# Patient Record
Sex: Male | Born: 1940 | Race: Asian | Hispanic: No | Marital: Married | Smoking: Former smoker
Health system: Southern US, Community
[De-identification: ages and names within clinical notes are randomized; demographics above are authoritative.]

## PROBLEM LIST (undated history)

## (undated) DIAGNOSIS — I219 Acute myocardial infarction, unspecified: Secondary | ICD-10-CM

## (undated) DIAGNOSIS — M1990 Unspecified osteoarthritis, unspecified site: Secondary | ICD-10-CM

## (undated) DIAGNOSIS — I255 Ischemic cardiomyopathy: Secondary | ICD-10-CM

## (undated) DIAGNOSIS — K219 Gastro-esophageal reflux disease without esophagitis: Secondary | ICD-10-CM

## (undated) DIAGNOSIS — I2510 Atherosclerotic heart disease of native coronary artery without angina pectoris: Secondary | ICD-10-CM

## (undated) DIAGNOSIS — E119 Type 2 diabetes mellitus without complications: Secondary | ICD-10-CM

## (undated) DIAGNOSIS — Z9289 Personal history of other medical treatment: Secondary | ICD-10-CM

## (undated) DIAGNOSIS — Z86711 Personal history of pulmonary embolism: Secondary | ICD-10-CM

## (undated) DIAGNOSIS — I10 Essential (primary) hypertension: Secondary | ICD-10-CM

## (undated) DIAGNOSIS — R739 Hyperglycemia, unspecified: Secondary | ICD-10-CM

## (undated) DIAGNOSIS — R7611 Nonspecific reaction to tuberculin skin test without active tuberculosis: Secondary | ICD-10-CM

## (undated) DIAGNOSIS — E785 Hyperlipidemia, unspecified: Secondary | ICD-10-CM

## (undated) DIAGNOSIS — I200 Unstable angina: Secondary | ICD-10-CM

## (undated) DIAGNOSIS — E782 Mixed hyperlipidemia: Secondary | ICD-10-CM

## (undated) HISTORY — DX: Nonspecific reaction to tuberculin skin test without active tuberculosis: R76.11

## (undated) HISTORY — DX: Gastro-esophageal reflux disease without esophagitis: K21.9

## (undated) HISTORY — DX: Unspecified osteoarthritis, unspecified site: M19.90

## (undated) HISTORY — DX: Personal history of other medical treatment: Z92.89

## (undated) HISTORY — DX: Ischemic cardiomyopathy: I25.5

## (undated) HISTORY — DX: Mixed hyperlipidemia: E78.2

## (undated) HISTORY — PX: CARDIAC SURGERY: SHX584

## (undated) HISTORY — PX: CORONARY ARTERY BYPASS GRAFT: SHX141

## (undated) HISTORY — DX: Hyperglycemia, unspecified: R73.9

## (undated) HISTORY — DX: Unstable angina: I20.0

## (undated) HISTORY — DX: Acute myocardial infarction, unspecified: I21.9

---

## 1998-09-27 ENCOUNTER — Emergency Department (HOSPITAL_COMMUNITY): Admission: EM | Admit: 1998-09-27 | Discharge: 1998-09-27 | Payer: Self-pay | Admitting: Emergency Medicine

## 1998-12-31 ENCOUNTER — Encounter: Payer: Self-pay | Admitting: General Surgery

## 1999-01-03 ENCOUNTER — Ambulatory Visit (HOSPITAL_COMMUNITY): Admission: RE | Admit: 1999-01-03 | Discharge: 1999-01-03 | Payer: Self-pay | Admitting: General Surgery

## 1999-01-03 ENCOUNTER — Encounter (INDEPENDENT_AMBULATORY_CARE_PROVIDER_SITE_OTHER): Payer: Self-pay | Admitting: *Deleted

## 1999-01-10 ENCOUNTER — Emergency Department (HOSPITAL_COMMUNITY): Admission: EM | Admit: 1999-01-10 | Discharge: 1999-01-10 | Payer: Self-pay | Admitting: Emergency Medicine

## 2000-01-20 ENCOUNTER — Encounter: Admission: RE | Admit: 2000-01-20 | Discharge: 2000-01-20 | Payer: Self-pay | Admitting: Internal Medicine

## 2000-01-24 ENCOUNTER — Ambulatory Visit (HOSPITAL_COMMUNITY): Admission: RE | Admit: 2000-01-24 | Discharge: 2000-01-24 | Payer: Self-pay | Admitting: Internal Medicine

## 2000-07-15 ENCOUNTER — Telehealth (INDEPENDENT_AMBULATORY_CARE_PROVIDER_SITE_OTHER): Payer: Self-pay | Admitting: Family Medicine

## 2000-12-25 ENCOUNTER — Encounter: Payer: Self-pay | Admitting: Otolaryngology

## 2000-12-25 ENCOUNTER — Encounter: Admission: RE | Admit: 2000-12-25 | Discharge: 2000-12-25 | Payer: Self-pay | Admitting: Otolaryngology

## 2001-01-04 ENCOUNTER — Other Ambulatory Visit: Admission: RE | Admit: 2001-01-04 | Discharge: 2001-01-04 | Payer: Self-pay | Admitting: Otolaryngology

## 2001-01-26 ENCOUNTER — Emergency Department (HOSPITAL_COMMUNITY): Admission: EM | Admit: 2001-01-26 | Discharge: 2001-01-26 | Payer: Self-pay | Admitting: Emergency Medicine

## 2001-01-31 ENCOUNTER — Ambulatory Visit: Admission: RE | Admit: 2001-01-31 | Discharge: 2001-05-01 | Payer: Self-pay | Admitting: *Deleted

## 2001-02-05 ENCOUNTER — Ambulatory Visit (HOSPITAL_COMMUNITY): Admission: RE | Admit: 2001-02-05 | Discharge: 2001-02-05 | Payer: Self-pay | Admitting: *Deleted

## 2001-02-05 ENCOUNTER — Encounter: Payer: Self-pay | Admitting: *Deleted

## 2001-02-12 ENCOUNTER — Encounter: Payer: Self-pay | Admitting: *Deleted

## 2001-02-12 ENCOUNTER — Ambulatory Visit (HOSPITAL_COMMUNITY): Admission: RE | Admit: 2001-02-12 | Discharge: 2001-02-12 | Payer: Self-pay | Admitting: *Deleted

## 2001-04-18 ENCOUNTER — Encounter: Payer: Self-pay | Admitting: *Deleted

## 2001-04-18 ENCOUNTER — Ambulatory Visit (HOSPITAL_COMMUNITY): Admission: RE | Admit: 2001-04-18 | Discharge: 2001-04-18 | Payer: Self-pay | Admitting: *Deleted

## 2003-07-08 ENCOUNTER — Inpatient Hospital Stay (HOSPITAL_COMMUNITY): Admission: EM | Admit: 2003-07-08 | Discharge: 2003-07-12 | Payer: Self-pay | Admitting: Emergency Medicine

## 2003-07-08 HISTORY — PX: CARDIAC CATHETERIZATION: SHX172

## 2003-07-09 ENCOUNTER — Encounter: Payer: Self-pay | Admitting: Internal Medicine

## 2003-07-09 DIAGNOSIS — I219 Acute myocardial infarction, unspecified: Secondary | ICD-10-CM

## 2003-07-09 HISTORY — DX: Acute myocardial infarction, unspecified: I21.9

## 2004-03-24 ENCOUNTER — Ambulatory Visit: Payer: Self-pay | Admitting: Internal Medicine

## 2004-03-24 ENCOUNTER — Observation Stay (HOSPITAL_COMMUNITY): Admission: EM | Admit: 2004-03-24 | Discharge: 2004-03-26 | Payer: Self-pay | Admitting: Emergency Medicine

## 2004-03-25 HISTORY — PX: CARDIAC CATHETERIZATION: SHX172

## 2004-05-06 ENCOUNTER — Ambulatory Visit (HOSPITAL_COMMUNITY): Admission: RE | Admit: 2004-05-06 | Discharge: 2004-05-06 | Payer: Self-pay | Admitting: Gastroenterology

## 2005-05-23 ENCOUNTER — Emergency Department (HOSPITAL_COMMUNITY): Admission: EM | Admit: 2005-05-23 | Discharge: 2005-05-23 | Payer: Self-pay | Admitting: Emergency Medicine

## 2006-02-07 ENCOUNTER — Emergency Department (HOSPITAL_COMMUNITY): Admission: EM | Admit: 2006-02-07 | Discharge: 2006-02-08 | Payer: Self-pay | Admitting: Emergency Medicine

## 2006-03-21 ENCOUNTER — Emergency Department (HOSPITAL_COMMUNITY): Admission: EM | Admit: 2006-03-21 | Discharge: 2006-03-21 | Payer: Self-pay | Admitting: Family Medicine

## 2006-05-11 ENCOUNTER — Emergency Department (HOSPITAL_COMMUNITY): Admission: EM | Admit: 2006-05-11 | Discharge: 2006-05-11 | Payer: Self-pay | Admitting: Family Medicine

## 2006-07-21 ENCOUNTER — Inpatient Hospital Stay (HOSPITAL_COMMUNITY): Admission: EM | Admit: 2006-07-21 | Discharge: 2006-07-24 | Payer: Self-pay | Admitting: Emergency Medicine

## 2006-07-23 ENCOUNTER — Encounter (INDEPENDENT_AMBULATORY_CARE_PROVIDER_SITE_OTHER): Payer: Self-pay | Admitting: Cardiology

## 2006-07-23 HISTORY — PX: CARDIAC CATHETERIZATION: SHX172

## 2006-12-23 ENCOUNTER — Emergency Department (HOSPITAL_COMMUNITY): Admission: EM | Admit: 2006-12-23 | Discharge: 2006-12-23 | Payer: Self-pay | Admitting: Emergency Medicine

## 2006-12-26 ENCOUNTER — Ambulatory Visit (HOSPITAL_COMMUNITY): Admission: RE | Admit: 2006-12-26 | Discharge: 2006-12-26 | Payer: Self-pay | Admitting: Urology

## 2006-12-31 ENCOUNTER — Emergency Department (HOSPITAL_COMMUNITY): Admission: EM | Admit: 2006-12-31 | Discharge: 2006-12-31 | Payer: Self-pay | Admitting: Emergency Medicine

## 2008-10-15 ENCOUNTER — Encounter: Admission: RE | Admit: 2008-10-15 | Discharge: 2008-10-15 | Payer: Self-pay | Admitting: Family Medicine

## 2009-10-05 ENCOUNTER — Encounter: Admission: RE | Admit: 2009-10-05 | Discharge: 2009-10-05 | Payer: Self-pay | Admitting: Family Medicine

## 2009-10-21 DIAGNOSIS — Z9289 Personal history of other medical treatment: Secondary | ICD-10-CM

## 2009-10-21 HISTORY — DX: Personal history of other medical treatment: Z92.89

## 2010-06-19 ENCOUNTER — Inpatient Hospital Stay (INDEPENDENT_AMBULATORY_CARE_PROVIDER_SITE_OTHER)
Admission: RE | Admit: 2010-06-19 | Discharge: 2010-06-19 | Disposition: A | Discharge status: Home / Self Care | Payer: Medicare Other | Source: Ambulatory Visit | Attending: Family Medicine | Admitting: Family Medicine

## 2010-06-19 ENCOUNTER — Emergency Department (HOSPITAL_COMMUNITY)
Admission: EM | Admit: 2010-06-19 | Discharge: 2010-06-19 | Disposition: A | Discharge status: Home / Self Care | Payer: Medicare Other | Attending: Emergency Medicine | Admitting: Emergency Medicine

## 2010-06-19 ENCOUNTER — Ambulatory Visit (INDEPENDENT_AMBULATORY_CARE_PROVIDER_SITE_OTHER): Payer: Medicare Other

## 2010-06-19 ENCOUNTER — Emergency Department (HOSPITAL_COMMUNITY): Payer: Medicare Other

## 2010-06-19 DIAGNOSIS — R079 Chest pain, unspecified: Secondary | ICD-10-CM

## 2010-06-19 DIAGNOSIS — R0989 Other specified symptoms and signs involving the circulatory and respiratory systems: Secondary | ICD-10-CM

## 2010-06-19 DIAGNOSIS — E7800 Pure hypercholesterolemia, unspecified: Secondary | ICD-10-CM | POA: Insufficient documentation

## 2010-06-19 DIAGNOSIS — R0609 Other forms of dyspnea: Secondary | ICD-10-CM

## 2010-06-19 DIAGNOSIS — I252 Old myocardial infarction: Secondary | ICD-10-CM | POA: Insufficient documentation

## 2010-06-19 DIAGNOSIS — R109 Unspecified abdominal pain: Secondary | ICD-10-CM | POA: Insufficient documentation

## 2010-06-19 DIAGNOSIS — Z86711 Personal history of pulmonary embolism: Secondary | ICD-10-CM | POA: Insufficient documentation

## 2010-06-19 DIAGNOSIS — R110 Nausea: Secondary | ICD-10-CM

## 2010-06-19 DIAGNOSIS — R112 Nausea with vomiting, unspecified: Secondary | ICD-10-CM | POA: Insufficient documentation

## 2010-06-19 DIAGNOSIS — R1013 Epigastric pain: Secondary | ICD-10-CM | POA: Insufficient documentation

## 2010-06-19 DIAGNOSIS — I2510 Atherosclerotic heart disease of native coronary artery without angina pectoris: Secondary | ICD-10-CM | POA: Insufficient documentation

## 2010-06-19 LAB — CBC
Hemoglobin: 14.2 g/dL (ref 13.0–17.0)
MCH: 26.3 pg (ref 26.0–34.0)
MCHC: 33.8 g/dL (ref 30.0–36.0)
MCV: 77.9 fL — ABNORMAL LOW (ref 78.0–100.0)
Platelets: 169 10*3/uL (ref 150–400)

## 2010-06-19 LAB — COMPREHENSIVE METABOLIC PANEL
AST: 39 U/L — ABNORMAL HIGH (ref 0–37)
BUN: 23 mg/dL (ref 6–23)
CO2: 28 mEq/L (ref 19–32)
Calcium: 9.5 mg/dL (ref 8.4–10.5)
Creatinine, Ser: 1.45 mg/dL (ref 0.4–1.5)
GFR calc Af Amer: 58 mL/min — ABNORMAL LOW (ref >60)
GFR calc non Af Amer: 48 mL/min — ABNORMAL LOW (ref >60)
Total Bilirubin: 0.6 mg/dL (ref 0.3–1.2)

## 2010-06-19 LAB — DIFFERENTIAL
Basophils Relative: 0 % (ref 0–1)
Eosinophils Absolute: 0.1 10*3/uL (ref 0.0–0.7)
Lymphs Abs: 1.4 10*3/uL (ref 0.7–4.0)
Monocytes Absolute: 0.7 10*3/uL (ref 0.1–1.0)
Monocytes Relative: 10 % (ref 3–12)

## 2010-06-28 NOTE — Discharge Summary (Signed)
NAMEWORTHY, BOSCHERT                   ACCOUNT NO.:  [id number]   MEDICAL RECORD NO.:  [id number]          PATIENT TYPE:  INP   LOCATION:  4741                         FACILITY:  [facility]   PHYSICIAN:  Vonna Kotyk R. Jacinto Halim, MD       DATE OF BIRTH:  1940-04-24   DATE OF ADMISSION:  07/21/2006  DATE OF DISCHARGE:  07/24/2006                               DISCHARGE SUMMARY   DISCHARGE DIAGNOSES:  1. Coronary disease with chest pain worrisome for unstable angina this      admission, catheterization revealing no progression of coronary      disease.  2. 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  3. Ejection fraction of 50% with apical hypokinesis.  4. Renal insufficiency with a creatinine of 1.7 on admission.  5. Dyslipidemia.  6. Hypertension.  7. History of noncompliance secondary to financial reasons.   HOSPITAL COURSE:  The patient is a 70 year old [place] male who does  not speak English who presented to the emergency room with chest pain.  He has a history of prior coronary disease.  He had an anterior MI in  May 2005 treated with a diagonal angioplasty.  He was restudied in  February 2006 and had no significant stenosis.  The patient was admitted  to telemetry, enzymes were obtained and he ruled out for an MI.  He was  put on heparin and nitrates and set up for diagnostic catheterization  which was done July 23, 2006, by Dr. Elsie Lincoln.  This revealed a 40% RCA, no  significant disease in the circumflex or OM, 30% LAD, small ramus  without obstruction and haziness in the proximal diagonal but no  significant stenosis.  His EF was 50% with severe apical hypokinesis.  Plan is for continued medical therapy.  We feel the patient can be  discharged July 24, 2006.  He did have an echocardiogram done, the  report is pending and will need to be followed up as an outpatient.   LABORATORIES:  White count 5.8, hemoglobin 12.8, hematocrit 39.6,  platelets 153, INR 1.0,  sodium 140, potassium 3.6, BUN 11, creatinine  1.2.  Liver functions were normal.  CK-MB and troponins were negative.  Magnesium was 1.9.  BNP was less than 30.  TSH 5.28.  D-dimer is 0.47.  Hemoglobin A1c was unable to be done for some reason according to the  lab.  Echocardiogram is pending.  Chest x-ray: Low volume but no acute  changes.   DISCHARGE MEDICATIONS:  Coated aspirin once a day, metoprolol 50 mg 1/2  tablet twice a day, simvastatin 40 mg a day, omeprazole 20 mg a day, and  multivitamins daily.  The patient was also apparently on isoniazid 300  mg a day and this will be continued.   DISPOSITION:  The patient is discharged in stable condition and will  follow up with Dr. Jacinto Halim in a couple weeks.  He will need follow-up LFTs  and lipids in about 6 weeks.      Abelino Derrick, P.A.  Cristy Hilts. Jacinto Halim, MD  Electronically Signed    LKK/MEDQ  D:  07/24/2006  T:  07/24/2006  Job:  [id number]

## 2010-06-28 NOTE — Cardiovascular Report (Signed)
NAMESTUART, [name]                   ACCOUNT NO.:  [id number]   MEDICAL RECORD NO.:  [id number]          PATIENT TYPE:  INP   LOCATION:  4741                         FACILITY:  [facility]   PHYSICIAN:  Madaline Savage, M.D.DATE OF BIRTH:  12-Apr-1940   DATE OF PROCEDURE:  07/23/2006  DATE OF DISCHARGE:                            CARDIAC CATHETERIZATION   PROCEDURES PERFORMED:  1. Selective coronary angiography by Judkins technique.  2. Retrograde left heart catheterization.  3. Left ventricular angiography   COMPLICATIONS:  None.   ENTRY SITE:  Right femoral.   DYE:  Used Omnipaque.   CATHETERS USED:  Five-French catheters.   COMPLICATIONS:  None.   PATIENT PROFILE:  The patient is a 70 year old [place] gentleman who  entered the cath lab today with a [place] interpreter, who was  provided by [facility] and who interpreted and was a liaison  between the patient and I, since the patient spoke no [place].   A diagnostic cardiac catheterization was completed without any  complications and results are described below.  The clinical scenario  was that the patient had an acute myocardial infarction on Jul 09, 2003,  and was cathed by Dr. Bonnee Quin, who noted that he had an occluded  diagonal branch that had balloon angioplasty only.  In February 2006,  Dr. Yates Decamp performed a cardiac cath with an intravascular ultrasound  of the diagonal branch and did not do any intervention.   Today, the patient is brought to the cath lab after presenting to the  hospital on July 20, 2006, with chest discomfort.  His cardiac enzymes  have shown a troponin of less than 0.05.  The CK-MB enzymes are not  listed.  Today's catheterization was performed via right percutaneous  femoral approach without any complications.  The patient's baseline  creatinine was 1.7.   RESULTS:  The patient's left ventricular pressure was 153/9, end-  diastolic pressure 25.  Central aortic pressure was  155/85 with a mean  of 115.  No aortic valve gradient by pullback technique.   The left coronary artery is anatomically separate.  The LAD and the  circumflex do not share the same left main.  They arise by separate  ostia.   The left anterior descending coronary artery courses the cardiac apex,  giving rise to one small-to-medium size diagonal branch which has  haziness in its proximal portion but is widely patent and there is TIMI  III distal flow.  The LAD contains a lot of lumpy bumpy irregularities  in the proximal portions of the vessel and in the midportions of the  vessel that amount to no more than 30%.  There are no high-grade  stenosis in the LAD or the diagonal.   There is a small intermediate ramus branch that arises off the proximal  circumflex.  There are no lesions.  The circumflex OM #1 and OM #2  widely patent and there are no lesions in the circumflex.   The right coronary artery is a huge 5-to-6-mm vessel that is dominant  and has a 40% area of  luminal irregularity at the uppermost point of a  shepherd's crook configuration of the vessel and the mid and distal  vessel has no evidence of stenosis.  There is a PDA and a PLA that  arises from the distal RCA that is widely patent, and there is a smaller  posterior descending branch that contains an area of 50% focal stenosis  in the mid posterior descending.  This is not felt to be a significant  lesion.   LEFT VENTRICULAR ANGIOGRAPHY:  Shows very impressive anteroapical,  apical, and inferior apical hypokinesis compatible with old anterior  apical myocardial infarction but the remaining wall segments are  hyperdynamic and his overall ejection fraction is measured at 50%.  There is no mitral regurgitation.   FINAL IMPRESSIONS:  1. Old anterior myocardial infarction secondary to an occluded      diagonal branch that had balloon angioplasty on Jul 09, 2003.  2. Trivial scattered nonobstructive coronary disease  including a      patent diagonal branch from balloon angioplasty of 2005.  3. Very mild ischemic cardiomyopathy with confined to the infra-      apical, anteroapical, and inferoapical wall segments.   PLAN:  Medical therapy.  I do not think the patient's current chest pain  is related to his heart.           ______________________________  Madaline Savage, M.D.     WHG/MEDQ  D:  07/23/2006  T:  07/23/2006  Job:  [id number]   cc:   Cristy Hilts. Jacinto Halim, MD  Cardiac Cath Lab, [facility]

## 2010-07-01 NOTE — Discharge Summary (Signed)
NAMENAHUM, SHERRER NO.:  [id number]   MEDICAL RECORD NO.:  [id number]                   PATIENT TYPE:  INP   LOCATION:  2004                                 FACILITY:  [facility]   PHYSICIAN:  Duke Salvia, M.D.               DATE OF BIRTH:  Nov 19, 1940   DATE OF ADMISSION:  07/08/2003  DATE OF DISCHARGE:  07/12/2003                           DISCHARGE SUMMARY - REFERRING   SUMMARY OF HISTORY:  Mr. Rosevear is a 70 year old [place] male who  presented to [facility] Emergency Room with a several day history of chest  discomfort.  It was noted that Mr. Flegel is a very difficult historian  secondary to the fact that he does not speak [place].  His family was  present; however, Dr. Graciela Husbands stated that the best he could tell from family's  interpretation is that he has been having chest discomfort and shortness of  breath for the preceding 48 hours.  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  In the emergency room, his markers were elevated.  Admission hemoglobin and hematocrit was 12.8 and 38.5, normal indices,  platelets 176, WBCs 8.6.  Subsequent hematologies were essentially  unremarkable.  Admission PT was 15.5.  Admission sodium was 137, potassium  3.4, BUN 15, creatinine 1.2.  Subsequent chemistries show correction of his  hypokalemia.  Initial CK was 46 with MB of 30.8, ___________ 6.3 and  troponin of 49.99.  Subsequent CK-MBs were out of indexes and troponins were  declining.  Fasting lipids on 5/26 showed a total cholesterol of 187,  triglycerides 52, HDL 40, LDL 137.  TSH was 3.255.  Echocardiogram on 5/26  showed an ejection fraction of 35-40% with mid distal, lateral and anterior  hypokinesis, apical akinesis, trivial pericardial effusion.  EKG on  presentation showed normal sinus rhythm, left axis deviation, left anterior  fascicular block, ST segment elevation  in V2-V4 with biphasic T waves in V4,  V5 and V6.  Subsequent EKGs showed improvement of these changes.   Radiology:  Chest x-ray showed no active disease.   HOSPITAL COURSE:  Mr. Wolk was placed on IV heparin and taken to the  catheterization lab emergently.  Dr. Riley Kill performed cardiac  catheterization and noted a 40-50% proximal LAD, 40-50% proximal circumflex,  30% mid LAD, 20% mid LAD.  He had a 100% diagonal 1 branch and a 30%  proximal RCA, anterior apical hypokinesis.  Dr. Riley Kill performed  angioplasty to the diagonal lesion, reducing this to 40%.  He noted that he  did not use a stent secondary to the size of the branch and ostial location.  He was given Plavix, heparin, Integrilin in the lab.  It is noted during  this time, old medical records were obtained, and revealed a history of a  V/Q  scan which revealed a pulmonary embolus in the lingular branch of the  left pulmonary artery.  He was treated at that time with Coumadin.  This  dictation also showed evidence of a history of normocytic anemia and early  repolarization on his EKG.  During this admission, his workup was not  suggestive of iron deficiency anemia.  It was felt that he possibly had a  beta-thalassemia trait, but this was not evaluated.  Dr. Linton Rump had seen  the patient and was treating at that time.  Post catheterization, an  echocardiogram was performed as the patient continued to have some chest  discomfort that was felt to be pleuritic.  Dr. Jens Som on 5/26, felt that  it was a component of pericarditis.  The echocardiogram ruled out any  significant pericardial effusion.  Medications were adjusted.  Subsequent  enzymes continued to trend down.  Cardiac rehabilitation participated in  education and assisting with ambulation.  Over the next several days, he  improved.  Dr. Eden Emms saw him both on 5/28 and 5/29, and it was felt that he  could be discharged home on 5/29.  His daughter was present for   interpreting.   DISCHARGE DIAGNOSES:  1. Acute anterior myocardial infarction with a late presentation.     Catheterization revealed mild obstructive coronary artery disease with     100% diagonal 1 which was angioplastied.  Echocardiogram does show     decreased ejection fraction with approximately 35-40% with wall motion     abnormalities as previously described.  2. Hyperlipidemia with elevated LDL.  3. Stable normocytic anemia.  4. Hyperkalemia, resolved.  5. Patient does not speak [place].  6. History of pulmonary embolism in 1997.  7. 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  8. Remote tobacco use.   DISPOSITION:  The patient is discharged home.   DISCHARGE MEDICATIONS:  1. Coated aspirin 325 mg every day.  2. Plavix 75 mg every day for unknown duration.  3. Altace 2.5 mg every day.  4. Lipitor 80 mg q.h.s.  5. Lopressor 50 mg, 1/2 tablet b.i.d.  6. Sublingual nitroglycerin as needed.   DISCHARGE INSTRUCTIONS:  He is advised no lifting, driving, sexual activity  or heavy exertion until seen by his physician.  Maintain low salt, low fat,  low cholesterol diet.  If he has any problems with the catheterization site,  he is asked to call us.  He was asked to avoid smoking or tobacco products  as it is not clear when he quit.  On Tuesday, he was asked to call our  office to arrange a two week appointment with Dr. Riley Kill.  At the time of  followup with Dr. Riley Kill, consideration should be given to a decision as to  the duration of the Plavix, titrating up Altace or his beta blocker.  He  also needs arrangement for six to eight week fasting lipids and LFTs since  Lipitor was initiated.  Also consideration at the time of followup with Dr.  Riley Kill should be made in assisting the patient with obtaining a primary  care physician as he does not have one.      Joellyn Rued, P.A. [facility]                    Duke Salvia, M.D.   EW/MEDQ  D:  07/12/2003  T:  07/12/2003   Job:  [id number]   cc:   Arturo Morton. Riley Kill, M.D. [facility]   Charlton Haws, M.D.  Olga Millers, M.D. [facility]

## 2010-07-01 NOTE — H&P (Signed)
[name]                   ACCOUNT NO.:  [id number]   MEDICAL RECORD NO.:  [id number]          PATIENT TYPE:  INP   LOCATION:  3729                         FACILITY:  [facility]   PHYSICIAN:  Arvilla Meres, M.D. LHCDATE OF BIRTH:  1940-08-29   DATE OF ADMISSION:  03/24/2004  DATE OF DISCHARGE:                                HISTORY & PHYSICAL   PRIMARY CARE PHYSICIAN:  Unknown   CARDIOLOGIST:  Dr. Bonnee Quin   [name] is a 70 year old [place] male with a history of a pulmonary  embolus in 1997 and CAD status post anterior MI with PTCA (no stent) of  diagonal lesion in May of 2005 admitted through the [facility] ER secondary  to chest pain and presyncope.   [name] is non-English speaking and all history was obtained through his  daughter.   Reportedly he has a history of non-Hodgkin's lymphoma and pulmonary embolism  in 1997.  More recently, in May of 2005 he was admitted with anterior MI.  A  catheter showed an EF of about 40%.  The LAD had about a 40-50% lesion  proximally.  The D1 had a 100% lesion ostially.  Left circumflex had a 40-  50% blockage proximally and the RCA had a 30% lesion proximally.  At that  time he was treated with angioplasty (no stent) to the D1 secondary to the  size and ostial nature of the lesion.  He has not had any chest pain or  heart failure since.  He has been compliant with his medications.  He has  been working as a Public affairs consultant.  This afternoon he had a sudden onset of chest  pain and shortness of breath with presyncope while playing with his  granddaughter.  He rated the chest pain as 7/10.  The pain was progressive  so he came to the ER.  In the ER his EKG and three sets of point of care  markers over two hours were negative.  He was treated with nitroglycerin and  morphine as chest pain decreased to 5/10, but not completely resolved.  He  states the pain is very similar to his heart attack pain in May 2005.   PAST MEDICAL  HISTORY:  1.  Anterior MI status post angioplasty of a D1 lesion.  2.  LV dysfunction.      1.  EF of 35-40% by echocardiogram with anterior hypokinesis and apical          akinesis.  No significant valvular abnormality.  3.  History of pulmonary embolus in 1997.  4.  History of non-Hodgkin's lymphoma, details unavailable.  5.  Hypertension.  6.  Hyperlipidemia.  7.  Anemia.   CURRENT MEDICATIONS:  1.  Aspirin 325.  2.  Toprol XL 25.  3.  Lipitor 80.  4.  Protonix 40 once a day.  5.  Altace 5 once a day.  6.  Plavix 75 once a day.  7.  Nitroglycerin p.r.n.   ALLERGIES:  He has no known drug allergies.   SOCIAL HISTORY:  Married.  He lives in  [place].  Works as a Public affairs consultant.  Has remote tobacco use,  but none for a long time.  Denies any alcohol or  drug use.   FAMILY HISTORY:  Noncontributory.   PHYSICAL EXAMINATION:  GENERAL:  He is comfortable and laying flat in bed  despite complaining of 5/10 chest pain.  VITAL SIGNS:  He is afebrile.  Blood pressure 139/87 with a heart rate of  53.  He is saturating 97% on room air.  HEENT:  Sclerae anicteric.  EOMI.  NECK:  Supple.  JVP about 7-8 cm of water plus prominent CV waves.  His  carotids are 2+ bilaterally without any bruits.  There is no lymphadenopathy  or thyromegaly.  CARDIAC:  Regular rate and rhythm with no murmurs, rubs, or gallops.  There  is no RV lift.  LUNGS:  Clear to auscultation.  ABDOMEN:  Soft, nontender, nondistended.  There is no hepatosplenomegaly or  bruits.  EXTREMITIES:  Warm with no cyanosis, clubbing, or edema.  PULSES:  Femoral pulses are 2+ bilaterally without bruits.  His distal  pulses are strong.   LABORATORIES:  Only current laboratories are three point of care markers,  all show a CK-MB of less than 1.0 and a troponin of 0.05.  There are no  other laboratories.  EKG shows sinus brady with a rate of 53.  There are  nonspecific ST-T wave changes in V4-V6.  Chest x-ray shows some mild   atelectasis in the left base, otherwise within normal limits.   ASSESSMENT/PLAN:  [name] is a 70 year old male as above with a history  of coronary artery disease status post anterior myocardial infarction and  balloon angioplasty of a D1 approximately now nine months ago.  Now presents  with recurrent chest pain reminiscent of previous angina with associated  presyncope.  Given his history of a pulmonary embolism, will check spiral CT  of his chest to rule out pulmonary embolism and dissection.  If this is  negative will proceed with cardiac catheterization in the a.m.  Will start  heparin and continue aspirin, Plavix, nitroglycerin, and beta blocker.  Also  continue to monitor serial cardiac markers.      DB/MEDQ  D:  03/25/2004  T:  03/25/2004  Job:  [id number]

## 2010-07-01 NOTE — H&P (Signed)
NAMEGIOVANY, COSBY                               ACCOUNT NO.:  [id number]   MEDICAL RECORD NO.:  [id number]                   PATIENT TYPE:  INP   LOCATION:  2926                                 FACILITY:  [facility]   PHYSICIAN:  Duke Salvia, M.D.               DATE OF BIRTH:  Feb 17, 1940   DATE OF ADMISSION:  07/08/2003  DATE OF DISCHARGE:                                HISTORY & PHYSICAL   HISTORY OF PRESENT ILLNESS:  Mr. Bowdish is seen in the emergency room  because of chest pain or 2 days duration, and abnormal cardiac enzymes.   Mr. Morales is very difficult to take a history from.  He is here with his  family.  He speaks [place] and they are able to answer questions, but  not very freely do they translate.  As best as I can tell, the patient had a  myocardial event in 1997 here at [facility].  The data for that are not available.  It sounds like he had a catheterization.  Further, it sounds like somebody  took him off of all of his cardiac medications.   He denies intercurrent chest pain, and it was not until another family  member showed up that this remote event even came to light.  Over the last  couple of days, he has had problems with some exercise intolerance, and over  the last 48 hours chest pain and shortness of breath.  On arrival to the  emergency room, he continues to have complaints, and his cardiac markers are  strikingly positive with a CK-MB of 44, and a troponin of greater than 30.   Electrocardiogram is abnormal, as described below.   As best as I can tell from the family, he does not have hypertension,  diabetes, nor does he smoke.  The only past medical history that I can  elucidate is some type of nasal cancer.  I now see on another note that he  has a history of non-Hodgkin's lymphoma, as well, though I do not know where  that information has come from.   MEDICATIONS:  He takes no medications.   ALLERGIES:  No known drug allergies.   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  He has at least 2  children were are with him here today.   PHYSICAL EXAMINATION:  GENERAL:  He is an elderly Asian man in mild  discomfort.  His blood pressure is ranging from 99-105/74 with a heart rate  of 65-85.  HEENT:  No __________  xanthoma.  NECK:  His neck veins were flat.  BACK:  Without kyphosis or scoliosis.  LUNGS:  He did have some bibasilar crackles.  HEART:  His heart sounds were regular without murmurs.  There was an S4.  ABDOMEN:  Soft with active bowel sounds, without midline pulsation or  hepatomegaly.  EXTREMITIES:  Femoral pulses were 2+, distal pulses were intact.  There was  no clubbing, cyanosis, or edema.  NEUROLOGIC:  Grossly normal.   Electrocardiogram demonstrated ST segment elevation in leads V1 to V3 with  poor R wave progression.   LABORATORY DATA:  Blood tests that are available currently demonstrate a  microcytic anemia with a hematocrit of 38, and MCV of 75.  He also has  positive cardiac enzymes, as noted.  His other labs are currently pending.   IMPRESSION:  1. Acute anterior wall MI with greater than 36 hours of discomfort.  2. History of (?) prior myocardial infarction.  3. Question history of non-Hodgkin's lymphoma .  4. Question history of skin cancer.  5. [place] speaking.  Poor historian.   DISCUSSION:  Mr. Worley has had an acute myocardial infarction, presumably  in the anterior leads, though I do not have an old electrocardiogram.  His  current electrocardiogram is certainly consistent with this.  It is striking  to me that with his past history he has had no cardiology follow up, and  this is of some concern to his family, though there are issues relating to  [facility].  It should be noted that the patient was coining prior  to coming to the emergency room.   PLAN:  1. We will admit the patient and obtain serial enzymes.  2. Continue heparin and Integrilin, aspirin, low-dose beta  blockers, and     nitroglycerin.  3. He will need urgent catheterization.  This may be made more so if his     blood pressure becomes a problem.                                                Duke Salvia, M.D.    SCK/MEDQ  D:  07/09/2003  T:  07/10/2003  Job:  [id number]

## 2010-07-01 NOTE — Cardiovascular Report (Signed)
NAMEAB, LEAMING                   ACCOUNT NO.:  [id number]   MEDICAL RECORD NO.:  [id number]          PATIENT TYPE:  INP   LOCATION:  3729                         FACILITY:  [facility]   PHYSICIAN:  Cristy Hilts. Jacinto Halim, MD       DATE OF BIRTH:  [birth year]   DATE OF PROCEDURE:  03/25/2004  DATE OF DISCHARGE:                              CARDIAC CATHETERIZATION   PROCEDURE PERFORMED:  1.  Left ventriculography.  2.  Selective right and left coronary arteriography.  3.  Abdominal aortogram.  4.  Left subclavian arteriography with visualization of LIMA.  5.  IVUS interrogation of the left anterior descending artery.  6.  Intracoronary nitroglycerin administration.   INDICATION:  [name] is a 70 year old gentleman with history of known  coronary artery disease status post acute anterolateral wall myocardial  infarction on June 09, 2003 and had undergone balloon angioplasty of  diagonal two of the left anterior descending artery.  Because of the small  vessel, he was left with balloon angioplasty result only.  He presented to  the emergency room yesterday complaining of chest discomfort similar to his  anginal pectoris.  He points his chest pain to his epigastric region.  Given  his prior cardiac history, restenosis of revascularized diagonal was thought  about and the patient was brought to the cardiac catheterization lab for  definitive diagnosis of coronary disease.   HEMODYNAMIC DATA:  1.  The left ventricular pressures were 119/5 with end-diastolic pressure of      13 mmHg.  2.  Aortic pressure 134/82 with a mean of 106 mmHg.  3.  There was no pressure gradient across the aortic valve.   ANGIOGRAPHIC DATA:  Left ventricle:  Left ventricular systolic function was  in the lower limit of normal with ejection fraction estimated about 50%.  There is mid to distal anterolateral wall hypokinesis.   Right coronary artery:  The right coronary artery is a large caliber vessel  and a  dominant vessel.  It gives origin to large PDA and PLA.  It has mild  ectasia in its proximal segment.   Left main coronary artery:  Left main coronary artery is very short and  bifurcates into LAD and circumflex.   Left anterior descending artery:  Left anterior descending artery is a large  caliber vessel.  It has mild ectasia in its proximal segment.  There is hazy  30-40% stenosis noted in is proximal segment and the ostium of the LAD.  Just after the origin of the diagonal two which was previously  angioplastied, there appears to be like a hazy 50% stenosis.  This is  unchanged from prior cardiac catheterization.  The diagonal two itself it  patent.  However, there is a 50% luminal narrowing.   Circumflex coronary artery:  Circumflex coronary artery is a moderate  caliber vessel.  Continues as a large obtuse marginal two after giving a  very small obtuse marginal one artery.   IVUS DATA:  The LAD has mild diffuse disease.  At the site of tightest  stenosis which  was visualized angiographically, there is a 5-mm lumen area  with a 60% stenosis in the segment.  There is soft plaque noted throughout  the mid and proximal LAD, but none of the lesions appeared unstable or  ulcerated.  None of the lesions appeared to be hemodynamically significant.   IMPRESSION:  1.  Moderate diffuse disease of the left anterior descending with lumpy,      bumpy mild ectasia in its proximal segment.  IVUS interrogation of this      LAD reveals the tightest region after the origin of diagonal two to be      60% stenosed.  However, there is a 5 sq mm lumen area at this tightest      spot.  The vessel measured 4.0 mm.  The ostium of the LAD also has mild      disease.  2.  The diagonal two POBA site is patent with 50% stenosis.  However, it is      a very small vessel with diffuse disease.  This does supply large area      of the anterolateral wall and the wall motion abnormality can be      explained by  this artery.  3.  Low normal left ventricular ejection fraction with mid-to-distal      anterolateral wall hypokinesis.  4.  These angiographic studies are not different from previous angiography      done during acute myocardial infarction on Jul 09, 2003.   RECOMMENDATIONS:  1.  Based on the data, continued medical therapy is advised.  Evaluation for      noncardiac cause of chest pain including gastroesophageal reflux disease      cannot be completely excluded.  He could still have unstable anginal      symptoms secondary to plaque burden in his LAD, but aggressive medical      therapy is indicated for the same.  2.  Protonix will be increased to twice a day and his lipids will be      followed up.  His HDL will also be followed up.  A small dose of Imdur      will be initiated for his therapy.   TECHNIQUE OF CARDIAC CATHETERIZATION:  Under usual sterile precautions,  using a 6 French right femoral arterial access, a 6 [place] multipurpose B2  catheter was advanced into the ascending aorta over a 0.035-inch J wire.  The catheter was then gently advanced to the left ventricle and left  ventricular pressures were monitored.  Hand contrast injection of the left  ventricle was performed both in the LAO and RAO projection.  The catheter  was flushed with saline and pulled back into the ascending aorta and  pressure gradient across the aortic valve was monitored.  The right coronary  artery was selectively engaged and angiography was performed.  In a similar  fashion, left main coronary artery was selectively engaged and angiography  was performed.  Because of short left main and difficulty to visualize the  LAD, the catheter was deep throated into the LAD for better visualization of  the LAD.  Then, the catheter was pulled back in the abdominal aorta and  abdominal aortogram was performed.  Then, the catheter was pulled out of the  body in the usual fashion.  TECHNIQUE OF IVUS  INTERROGATION:  A 7 French sheath was introduced.  Then, a  6 [place] FL 3.5 guide was advanced into the ascending aorta.  The catheter  was  advanced to the ascending aorta over a 0.035-inch J wire.  Left main  coronary artery was selectively engaged and angiography was performed.  Then, a 190-cm x 0.014-inch ATW guide wire was utilized to cross into the  LAD and wire was carefully positioned in the distal LAD.  Then, a Galaxy IVU  catheter was advanced over this wire and IVUS interrogation was carefully  performed of the LAD.  Automatic pullback and also manual pullback was  performed.  The data was carefully analyzed.  Then, the IVUS catheter and  the wire was withdrawn out of the body and angiography was repeated.  200  mcg of intracoronary  nitroglycerin was also administered prior to IVUS interrogation.  Then, the  guide catheter was disengaged, pulled out of the body in the usual fashion.  The patient tolerated the procedure well.  No immediate complication was  noted.      JRG/MEDQ  D:  03/25/2004  T:  03/25/2004  Job:  [id number]

## 2010-07-01 NOTE — Cardiovascular Report (Signed)
NAMEKEATH, MATERA                               ACCOUNT NO.:  [id number]   MEDICAL RECORD NO.:  [id number]                   PATIENT TYPE:  INP   LOCATION:  2004                                 FACILITY:  [facility]   PHYSICIAN:  Arturo Morton. Riley Kill, M.D. [facility]         DATE OF BIRTH:  [birth year]   DATE OF PROCEDURE:  07/08/2003  DATE OF DISCHARGE:  07/12/2003                              CARDIAC CATHETERIZATION   INDICATIONS FOR PROCEDURE:  A 70 year old gentleman presenting with evidence  of an acute myocardial infarction.   PROCEDURES:  1. Left-heart catheterization.  2. Selective coronary arteriography.  3. Biplane selective left ventriculography.  4. Percutaneous intervention of the first diagonal branch.   DESCRIPTION OF PROCEDURE:  The patient was brought to the catheterization  laboratory and prepped and draped in the usual fashion.  Through an anterior  puncture, the femoral artery was entered.  A 6-French sheath was placed.  Views of the left and right coronary arteries were obtained in multiple  angiographic projections.  The patient had evidence of an occlusion of the  diagonal branch.  It was totally occluded.  Following this, the patient was  given heparin and Integrilin to prolong the ACT appropriately.  The patient  received aspirin and 300 mg of oral clopidogrel.  The lesion was crossed  using a 6-French CLS 3 guide and 190 cm traverse wire.  Initial dilatation  was done with a 2.0 x15 Maverick.  This was followed by a 2.25 x 20 Maverick  balloon with dilatation up to 8 atmospheres.  There was improvement in the  appearance of the artery from about 80 to 40%, and restoration of TIMI 3  flow to the distal vessel which had a fair amount of diffuse luminal  irregularity.  The patient tolerated the procedure well and was taken to the  holding area in satisfactory clinical condition following the procedure.   HEMODYNAMIC DATA:  1. Central aorta 123/75, mean 96.  2. Left  ventricle 131/20.  3. No gradient on pullback across the aortic valve.   ANGIOGRAPHIC DATA:  1. Ventriculography is performed in the biplane fashion.  In the RAO     projection, ejection fraction was estimated at 45%.  The mid and distal     anterolateral wall and anterior portion of the apex was nearly akinetic.     The distal inferior wall removed relatively well.  2. The left main was free of critical disease.  3. The LAD has a lot of ectasia throughout the vessel.  Right at the ostium,     there appears to be about a 40 to 50% area of focal narrowing just after     the ostial takeoff.  After the second diagonal takeoff, 30 and 20% areas     of luminal irregularity.  The diagonal itself was totally occluded.  It     has patent flow following reperfusion therapy.  There is retrograde     filling of this vessel.  4. There is a ramus intermedius and moderate circumflex system without     critical narrowing.  5. The right coronary artery is a dominant vessel.  There is a fair amount     of ectasia in this vessel as well with about 30% proximal narrowing.   CONCLUSIONS:  1. Mild/ moderate reduction in global left ventricular function with a wall     motion abnormality involving the diagonal territory.  2. Total occlusion of the diagonal branch with successful reperfusion by     angioplasty alone using a 2.0 and 2.25 Maverick balloons.  3. Scattered ectasia of the left anterior descending artery, and moderate     stenosis near the ostium as noted above.   DISPOSITION:  1. The patient will be treated medically at the present time.  2. He will need followup as an outpatient.                                               Arturo Morton. Riley Kill, M.D. [facility]    TDS/MEDQ  D:  08/11/2003  T:  08/11/2003  Job:  [id number]

## 2010-11-22 LAB — CBC
Hemoglobin: 13.8
RBC: 5.34
WBC: 6.4

## 2010-11-22 LAB — POCT URINALYSIS DIP (DEVICE)
Bilirubin Urine: NEGATIVE
Glucose, UA: NEGATIVE
Hgb urine dipstick: NEGATIVE
Nitrite: NEGATIVE
Operator id: 116391
Urobilinogen, UA: 0.2

## 2010-11-22 LAB — DIFFERENTIAL
Basophils Relative: 1
Eosinophils Absolute: 0.2
Eosinophils Relative: 3
Monocytes Relative: 9
Neutrophils Relative %: 65

## 2010-11-22 LAB — COMPREHENSIVE METABOLIC PANEL
ALT: 32
AST: 35
Alkaline Phosphatase: 109
CO2: 29
GFR calc Af Amer: >60
GFR calc non Af Amer: >60
Glucose, Bld: 69 — ABNORMAL LOW
Potassium: 4.5
Sodium: 140
Total Protein: 8.2

## 2010-12-01 LAB — CARDIAC PANEL(CRET KIN+CKTOT+MB+TROPI)
Relative Index: INVALID
Total CK: 44

## 2010-12-01 LAB — BASIC METABOLIC PANEL
BUN: 11
Calcium: 8.4
Creatinine, Ser: 1.21
GFR calc non Af Amer: 60 — ABNORMAL LOW
Potassium: 3.6

## 2010-12-01 LAB — POCT I-STAT CREATININE
Creatinine, Ser: 1.7 — ABNORMAL HIGH
Operator id: 277751

## 2010-12-01 LAB — CK TOTAL AND CKMB (NOT AT ARMC)
CK, MB: 2.2
CK, MB: 2.3
Relative Index: INVALID
Total CK: 50

## 2010-12-01 LAB — I-STAT 8, (EC8 V) (CONVERTED LAB)
Bicarbonate: 25.4 — ABNORMAL HIGH
Glucose, Bld: 142 — ABNORMAL HIGH
HCT: 41
Hemoglobin: 13.9
Operator id: 277751
Potassium: 3.9
Sodium: 140
TCO2: 27

## 2010-12-01 LAB — CBC
HCT: 37.2 — ABNORMAL LOW
HCT: 38.2 — ABNORMAL LOW
Hemoglobin: 12.4 — ABNORMAL LOW
Hemoglobin: 12.6 — ABNORMAL LOW
MCHC: 33
MCV: 78.8
MCV: 79.2
MCV: 79.9
Platelets: 145 — ABNORMAL LOW
Platelets: 153
Platelets: 175
RBC: 4.85
RDW: 13.6
RDW: 13.7
RDW: 13.8
WBC: 5.7
WBC: 5.8
WBC: 6.1

## 2010-12-01 LAB — LIPID PANEL
LDL Cholesterol: 126 — ABNORMAL HIGH
Total CHOL/HDL Ratio: 7.2
Triglycerides: 168 — ABNORMAL HIGH
VLDL: 34

## 2010-12-01 LAB — COMPREHENSIVE METABOLIC PANEL
ALT: 33
AST: 30
Albumin: 3.5
CO2: 27
Chloride: 105
Creatinine, Ser: 1.26
GFR calc Af Amer: >60
GFR calc non Af Amer: 57 — ABNORMAL LOW
Sodium: 140
Total Bilirubin: 0.9

## 2010-12-01 LAB — DIFFERENTIAL
Basophils Absolute: 0
Eosinophils Absolute: 0.2
Eosinophils Relative: 3
Lymphocytes Relative: 32
Lymphs Abs: 1.8
Monocytes Absolute: 0.6

## 2010-12-01 LAB — SAMPLE TO BLOOD BANK

## 2010-12-01 LAB — POCT CARDIAC MARKERS
Operator id: 277751
Troponin i, poc: <0.05
Troponin i, poc: <0.05

## 2010-12-01 LAB — HEPARIN LEVEL (UNFRACTIONATED)
Heparin Unfractionated: 0.64
Heparin Unfractionated: 0.74 — ABNORMAL HIGH
Heparin Unfractionated: 0.82 — ABNORMAL HIGH
Heparin Unfractionated: <0.1 — ABNORMAL LOW

## 2010-12-01 LAB — D-DIMER, QUANTITATIVE
D-Dimer, Quant: 0.37
D-Dimer, Quant: 0.47

## 2010-12-01 LAB — B-NATRIURETIC PEPTIDE (CONVERTED LAB): Pro B Natriuretic peptide (BNP): <30

## 2010-12-01 LAB — MAGNESIUM: Magnesium: 1.9

## 2010-12-01 LAB — TROPONIN I: Troponin I: 0.02

## 2010-12-13 ENCOUNTER — Ambulatory Visit
Admission: RE | Admit: 2010-12-13 | Discharge: 2010-12-13 | Disposition: A | Discharge status: Home / Self Care | Payer: Medicare Other | Source: Ambulatory Visit | Attending: Family Medicine | Admitting: Family Medicine

## 2010-12-13 ENCOUNTER — Other Ambulatory Visit: Payer: Self-pay | Admitting: Family Medicine

## 2010-12-13 DIAGNOSIS — R509 Fever, unspecified: Secondary | ICD-10-CM

## 2010-12-13 DIAGNOSIS — R05 Cough: Secondary | ICD-10-CM

## 2011-05-22 ENCOUNTER — Encounter (HOSPITAL_COMMUNITY): Payer: Self-pay | Admitting: *Deleted

## 2011-05-22 ENCOUNTER — Inpatient Hospital Stay (HOSPITAL_COMMUNITY)
Admission: EM | Admit: 2011-05-22 | Discharge: 2011-05-24 | DRG: 684 | Disposition: A | Discharge status: Home / Self Care | Payer: Medicare Other | Source: Ambulatory Visit | Attending: Internal Medicine | Admitting: Internal Medicine

## 2011-05-22 DIAGNOSIS — Z951 Presence of aortocoronary bypass graft: Secondary | ICD-10-CM

## 2011-05-22 DIAGNOSIS — Z9861 Coronary angioplasty status: Secondary | ICD-10-CM

## 2011-05-22 DIAGNOSIS — I2510 Atherosclerotic heart disease of native coronary artery without angina pectoris: Secondary | ICD-10-CM | POA: Diagnosis present

## 2011-05-22 DIAGNOSIS — R0781 Pleurodynia: Secondary | ICD-10-CM | POA: Diagnosis present

## 2011-05-22 DIAGNOSIS — Z79899 Other long term (current) drug therapy: Secondary | ICD-10-CM

## 2011-05-22 DIAGNOSIS — I252 Old myocardial infarction: Secondary | ICD-10-CM

## 2011-05-22 DIAGNOSIS — E785 Hyperlipidemia, unspecified: Secondary | ICD-10-CM | POA: Diagnosis present

## 2011-05-22 DIAGNOSIS — N179 Acute kidney failure, unspecified: Principal | ICD-10-CM | POA: Diagnosis present

## 2011-05-22 DIAGNOSIS — I10 Essential (primary) hypertension: Secondary | ICD-10-CM | POA: Diagnosis present

## 2011-05-22 DIAGNOSIS — A088 Other specified intestinal infections: Secondary | ICD-10-CM | POA: Diagnosis present

## 2011-05-22 DIAGNOSIS — R197 Diarrhea, unspecified: Secondary | ICD-10-CM | POA: Diagnosis present

## 2011-05-22 DIAGNOSIS — E860 Dehydration: Secondary | ICD-10-CM | POA: Diagnosis present

## 2011-05-22 DIAGNOSIS — Z7982 Long term (current) use of aspirin: Secondary | ICD-10-CM

## 2011-05-22 DIAGNOSIS — R0789 Other chest pain: Secondary | ICD-10-CM | POA: Diagnosis present

## 2011-05-22 DIAGNOSIS — Z86711 Personal history of pulmonary embolism: Secondary | ICD-10-CM

## 2011-05-22 HISTORY — DX: Personal history of pulmonary embolism: Z86.711

## 2011-05-22 HISTORY — DX: Atherosclerotic heart disease of native coronary artery without angina pectoris: I25.10

## 2011-05-22 HISTORY — DX: Hyperlipidemia, unspecified: E78.5

## 2011-05-22 HISTORY — DX: Essential (primary) hypertension: I10

## 2011-05-22 LAB — DIFFERENTIAL
Lymphocytes Relative: 38 % (ref 12–46)
Lymphs Abs: 1.6 10*3/uL (ref 0.7–4.0)
Monocytes Relative: 14 % — ABNORMAL HIGH (ref 3–12)
Neutro Abs: 1.8 10*3/uL (ref 1.7–7.7)
Neutrophils Relative %: 44 % (ref 43–77)

## 2011-05-22 LAB — CBC
Hemoglobin: 13.4 g/dL (ref 13.0–17.0)
Platelets: 185 10*3/uL (ref 150–400)
RBC: 5.15 MIL/uL (ref 4.22–5.81)
WBC: 4.1 10*3/uL (ref 4.0–10.5)

## 2011-05-22 NOTE — ED Notes (Signed)
The pt hashad some lt lower chest pain  For 3-4 days with sob dizziness .  Hs mi

## 2011-05-23 ENCOUNTER — Encounter (HOSPITAL_COMMUNITY): Payer: Self-pay | Admitting: Family Medicine

## 2011-05-23 ENCOUNTER — Emergency Department (HOSPITAL_COMMUNITY): Payer: Medicare Other

## 2011-05-23 DIAGNOSIS — I2510 Atherosclerotic heart disease of native coronary artery without angina pectoris: Secondary | ICD-10-CM

## 2011-05-23 DIAGNOSIS — N179 Acute kidney failure, unspecified: Secondary | ICD-10-CM | POA: Diagnosis present

## 2011-05-23 DIAGNOSIS — R0781 Pleurodynia: Secondary | ICD-10-CM | POA: Diagnosis present

## 2011-05-23 DIAGNOSIS — R197 Diarrhea, unspecified: Secondary | ICD-10-CM | POA: Diagnosis present

## 2011-05-23 DIAGNOSIS — I10 Essential (primary) hypertension: Secondary | ICD-10-CM

## 2011-05-23 LAB — COMPREHENSIVE METABOLIC PANEL
ALT: 56 U/L — ABNORMAL HIGH (ref 0–53)
Alkaline Phosphatase: 124 U/L — ABNORMAL HIGH (ref 39–117)
BUN: 24 mg/dL — ABNORMAL HIGH (ref 6–23)
Chloride: 101 mEq/L (ref 96–112)
GFR calc Af Amer: 48 mL/min — ABNORMAL LOW (ref >90)
Glucose, Bld: 131 mg/dL — ABNORMAL HIGH (ref 70–99)
Potassium: 4.3 mEq/L (ref 3.5–5.1)
Sodium: 138 mEq/L (ref 135–145)
Total Bilirubin: 0.5 mg/dL (ref 0.3–1.2)
Total Protein: 8.5 g/dL — ABNORMAL HIGH (ref 6.0–8.3)

## 2011-05-23 LAB — CBC
HCT: 33.5 % — ABNORMAL LOW (ref 39.0–52.0)
MCHC: 33.4 g/dL (ref 30.0–36.0)
RDW: 13.8 % (ref 11.5–15.5)

## 2011-05-23 LAB — TROPONIN I
Troponin I: <0.3 ng/mL (ref <0.30)
Troponin I: <0.3 ng/mL (ref <0.30)
Troponin I: <0.3 ng/mL (ref <0.30)

## 2011-05-23 LAB — BASIC METABOLIC PANEL
BUN: 22 mg/dL (ref 6–23)
GFR calc Af Amer: 59 mL/min — ABNORMAL LOW (ref >90)
GFR calc non Af Amer: 51 mL/min — ABNORMAL LOW (ref >90)
Potassium: 4.2 mEq/L (ref 3.5–5.1)

## 2011-05-23 LAB — POCT I-STAT TROPONIN I: Troponin i, poc: 0.01 ng/mL (ref 0.00–0.08)

## 2011-05-23 LAB — CK TOTAL AND CKMB (NOT AT ARMC): Total CK: 135 U/L (ref 7–232)

## 2011-05-23 LAB — LIPASE, BLOOD: Lipase: 41 U/L (ref 11–59)

## 2011-05-23 MED ORDER — SODIUM CHLORIDE 0.9 % IJ SOLN
3.0000 mL | Freq: Two times a day (BID) | INTRAMUSCULAR | Status: DC
  Administered 2011-05-23 – 2011-05-24 (×3): 3 mL via INTRAVENOUS

## 2011-05-23 MED ORDER — METOPROLOL SUCCINATE ER 25 MG PO TB24
25.0000 mg | ORAL_TABLET | Freq: Every day | ORAL | Status: DC
  Administered 2011-05-23 – 2011-05-24 (×2): 25 mg via ORAL
  Filled 2011-05-23 (×2): qty 1

## 2011-05-23 MED ORDER — ALUM & MAG HYDROXIDE-SIMETH 200-200-20 MG/5ML PO SUSP: 30.0000 mL | Freq: Four times a day (QID) | ORAL | Status: DC | PRN

## 2011-05-23 MED ORDER — ONDANSETRON HCL 4 MG/2ML IJ SOLN: 4.0000 mg | Freq: Four times a day (QID) | INTRAMUSCULAR | Status: DC | PRN

## 2011-05-23 MED ORDER — HEPARIN SODIUM (PORCINE) 5000 UNIT/ML IJ SOLN
5000.0000 [IU] | Freq: Three times a day (TID) | INTRAMUSCULAR | Status: DC
  Administered 2011-05-23 – 2011-05-24 (×4): 5000 [IU] via SUBCUTANEOUS
  Filled 2011-05-23 (×7): qty 1

## 2011-05-23 MED ORDER — MORPHINE SULFATE 4 MG/ML IJ SOLN
4.0000 mg | Freq: Once | INTRAMUSCULAR | Status: AC
  Administered 2011-05-23: 4 mg via INTRAVENOUS
  Filled 2011-05-23: qty 1

## 2011-05-23 MED ORDER — NITROGLYCERIN 0.4 MG SL SUBL
0.4000 mg | SUBLINGUAL_TABLET | SUBLINGUAL | Status: AC | PRN
  Administered 2011-05-23 (×3): 0.4 mg via SUBLINGUAL

## 2011-05-23 MED ORDER — ONDANSETRON HCL 4 MG/2ML IJ SOLN
4.0000 mg | Freq: Once | INTRAMUSCULAR | Status: AC
  Administered 2011-05-23: 4 mg via INTRAVENOUS
  Filled 2011-05-23: qty 2

## 2011-05-23 MED ORDER — ASPIRIN 325 MG PO TABS
325.0000 mg | ORAL_TABLET | Freq: Every day | ORAL | Status: DC
  Administered 2011-05-23 – 2011-05-24 (×2): 325 mg via ORAL
  Filled 2011-05-23 (×2): qty 1

## 2011-05-23 MED ORDER — PANTOPRAZOLE SODIUM 40 MG PO TBEC
40.0000 mg | DELAYED_RELEASE_TABLET | Freq: Every day | ORAL | Status: DC
  Administered 2011-05-23 – 2011-05-24 (×2): 40 mg via ORAL
  Filled 2011-05-23 (×2): qty 1

## 2011-05-23 MED ORDER — ONDANSETRON HCL 4 MG PO TABS: 4.0000 mg | ORAL_TABLET | Freq: Four times a day (QID) | ORAL | Status: DC | PRN

## 2011-05-23 MED ORDER — SODIUM CHLORIDE 0.9 % IV SOLN
INTRAVENOUS | Status: DC
  Administered 2011-05-23 – 2011-05-24 (×2): via INTRAVENOUS

## 2011-05-23 NOTE — Progress Notes (Signed)
Subjective:  "hungry"  Objective: Weight change:   Intake/Output Summary (Last 24 hours) at 05/23/11 1819 Last data filed at 05/23/11 1352  Gross per 24 hour  Intake    240 ml  Output    251 ml  Net    -11 ml    Filed Vitals:   05/23/11 1500  BP: 117/68  Pulse: 60  Temp: 98.1 F (36.7 C)  Resp: 18   On Exam he is alert afebrile comfortable CVS S1S2 heard Lungs clear Abdomen : soft non tender non distended bowel sounds heard Extremities: no pedal edema.   Lab Results: Results for orders placed during the hospital encounter of 05/22/11 (from the past 24 hour(s))  CBC     Status: Normal   Collection Time   05/22/11 11:41 PM      Component Value Range   WBC 4.1  4.0 - 10.5 (K/uL)   RBC 5.15  4.22 - 5.81 (MIL/uL)   Hemoglobin 13.4  13.0 - 17.0 (g/dL)   HCT 46.9  62.9 - 52.8 (%)   MCV 78.6  78.0 - 100.0 (fL)   MCH 26.0  26.0 - 34.0 (pg)   MCHC 33.1  30.0 - 36.0 (g/dL)   RDW 41.3  24.4 - 01.0 (%)   Platelets 185  150 - 400 (K/uL)  DIFFERENTIAL     Status: Abnormal   Collection Time   05/22/11 11:41 PM      Component Value Range   Neutrophils Relative 44  43 - 77 (%)   Neutro Abs 1.8  1.7 - 7.7 (K/uL)   Lymphocytes Relative 38  12 - 46 (%)   Lymphs Abs 1.6  0.7 - 4.0 (K/uL)   Monocytes Relative 14 (*) 3 - 12 (%)   Monocytes Absolute 0.6  0.1 - 1.0 (K/uL)   Eosinophils Relative 3  0 - 5 (%)   Eosinophils Absolute 0.1  0.0 - 0.7 (K/uL)   Basophils Relative 0  0 - 1 (%)   Basophils Absolute 0.0  0.0 - 0.1 (K/uL)  COMPREHENSIVE METABOLIC PANEL     Status: Abnormal   Collection Time   05/22/11 11:41 PM      Component Value Range   Sodium 138  135 - 145 (mEq/L)   Potassium 4.3  3.5 - 5.1 (mEq/L)   Chloride 101  96 - 112 (mEq/L)   CO2 26  19 - 32 (mEq/L)   Glucose, Bld 131 (*) 70 - 99 (mg/dL)   BUN 24 (*) 6 - 23 (mg/dL)   Creatinine, Ser 2.72 (*) 0.50 - 1.35 (mg/dL)   Calcium 9.1  8.4 - 53.6 (mg/dL)   Total Protein 8.5 (*) 6.0 - 8.3 (g/dL)   Albumin 4.0  3.5 - 5.2  (g/dL)   AST 52 (*) 0 - 37 (U/L)   ALT 56 (*) 0 - 53 (U/L)   Alkaline Phosphatase 124 (*) 39 - 117 (U/L)   Total Bilirubin 0.5  0.3 - 1.2 (mg/dL)   GFR calc non Af Amer 41 (*) >90 (mL/min)   GFR calc Af Amer 48 (*) >90 (mL/min)  CK TOTAL AND CKMB     Status: Normal   Collection Time   05/22/11 11:42 PM      Component Value Range   Total CK 135  7 - 232 (U/L)   CK, MB 2.3  0.3 - 4.0 (ng/mL)   Relative Index 1.7  0.0 - 2.5   POCT I-STAT TROPONIN I     Status: Normal  Collection Time   05/22/11 11:50 PM      Component Value Range   Troponin i, poc 0.01  0.00 - 0.08 (ng/mL)   Comment 3           LIPASE, BLOOD     Status: Normal   Collection Time   05/23/11  1:42 AM      Component Value Range   Lipase 41  11 - 59 (U/L)  CBC     Status: Abnormal   Collection Time   05/23/11  4:00 AM      Component Value Range   WBC 5.2  4.0 - 10.5 (K/uL)   RBC 4.26  4.22 - 5.81 (MIL/uL)   Hemoglobin 11.2 (*) 13.0 - 17.0 (g/dL)   HCT 16.1 (*) 09.6 - 52.0 (%)   MCV 78.6  78.0 - 100.0 (fL)   MCH 26.3  26.0 - 34.0 (pg)   MCHC 33.4  30.0 - 36.0 (g/dL)   RDW 04.5  40.9 - 81.1 (%)   Platelets 151  150 - 400 (K/uL)  BASIC METABOLIC PANEL     Status: Abnormal   Collection Time   05/23/11  4:00 AM      Component Value Range   Sodium 137  135 - 145 (mEq/L)   Potassium 4.2  3.5 - 5.1 (mEq/L)   Chloride 104  96 - 112 (mEq/L)   CO2 23  19 - 32 (mEq/L)   Glucose, Bld 89  70 - 99 (mg/dL)   BUN 22  6 - 23 (mg/dL)   Creatinine, Ser 9.14  0.50 - 1.35 (mg/dL)   Calcium 8.3 (*) 8.4 - 10.5 (mg/dL)   GFR calc non Af Amer 51 (*) >90 (mL/min)   GFR calc Af Amer 59 (*) >90 (mL/min)  TROPONIN I     Status: Normal   Collection Time   05/23/11  4:01 AM      Component Value Range   Troponin I <0.30  <0.30 (ng/mL)  TROPONIN I     Status: Normal   Collection Time   05/23/11  9:56 AM      Component Value Range   Troponin I <0.30  <0.30 (ng/mL)  TROPONIN I     Status: Normal   Collection Time   05/23/11  3:25 PM       Component Value Range   Troponin I <0.30  <0.30 (ng/mL)     Micro Results: No results found for this or any previous visit (from the past 240 hour(s)).  Studies/Results: Dg Chest Portable 1 View  05/23/2011  *RADIOLOGY REPORT*  Clinical Data: Chest pain.Shortness of breath.  PORTABLE CHEST - 1 VIEW  Comparison: 12/13/2010.  Findings: Poor inspiration.  Pulmonary vascular congestion most notable centrally.  No segmental infiltrate or pneumothorax.  Mild cardiomegaly.  Mildly tortuous aorta.  IMPRESSION: Poor inspiration with mild cardiomegaly and pulmonary vascular prominence most notable centrally.  Slightly tortuous aorta.  Original Report Authenticated By: Fuller Canada, M.D.   Medications: Scheduled Meds:   . aspirin  325 mg Oral Daily  . heparin  5,000 Units Subcutaneous Q8H  . metoprolol succinate  25 mg Oral Daily  .  morphine injection  4 mg Intravenous Once  . ondansetron  4 mg Intravenous Once  . pantoprazole  40 mg Oral Q1200  . sodium chloride  3 mL Intravenous Q12H   Continuous Infusions:   . sodium chloride 75 mL/hr at 05/23/11 0404   PRN Meds:.alum & mag hydroxide-simeth, nitroGLYCERIN, ondansetron (ZOFRAN)  IV, ondansetron  Assessment/Plan: Patient Active Hospital Problem List: AKI (acute kidney injury) (05/23/2011) Resolved. Chest pain (05/23/2011) Resolved. Atypical. Cardiac enzymes negative.  Tele monitor negative for st t wave changes. 2d echocardiogram ordered.  CAD (coronary artery disease) (05/23/2011) Continue with aspirin and metoprolol.  Diarrhea/ nausea and vomiting: probably viral gastroenteritis.  All the symptoms have resolved and i have started the patient on regular diet.  Hypertension (05/23/2011) Controlled.   Full code Dispo: possible d/c in am after echo is normal.   LOS: 1 day   [name] 05/23/2011, 6:19 PM

## 2011-05-23 NOTE — Progress Notes (Signed)
Utilization review completed.  

## 2011-05-23 NOTE — ED Notes (Signed)
Pt given urinal for specimen. Unable to provide sample at this time

## 2011-05-23 NOTE — H&P (Signed)
PCP:   Thora Lance, MD, MD   Chief Complaint:  Chest pains  HPI: This is a 71 year old male who 3 days ago developed left-sided chest pains. It's described as dull but constant and 7/10 at its worse. He states he has palpitations. Pain is worse with bending. He does report shortness of breath. He has a history of coronary artery disease with stent placement. He states pain is similar to when he had his MI. Reports chest pain is relieved with nitroglycerin. His last stress test approximately a year ago. He states never gets chest pain.  He reports fevers and and nausea but no vomiting. He is diarrhea yesterday but none today. Reports decreased urine output today. He denies any wheezing or any cough he states  medic he is compliant with medications.  The patient speaks only [place] dialect. Family was present at bedside to translate.   Review of Systems: Positives bolded  The patient denies anorexia, fever, weight loss,, vision loss, decreased hearing, hoarseness, chest pain, syncope, dyspnea on exertion, peripheral edema, balance deficits, hemoptysis, abdominal pain, melena, hematochezia, severe indigestion/heartburn, hematuria, incontinence, genital sores, muscle weakness, suspicious skin lesions, transient blindness, difficulty walking, depression, unusual weight change, abnormal bleeding, enlarged lymph nodes, angioedema, and breast masses.  Past Medical History: Past Medical History  Diagnosis Date  . Coronary artery disease   . Hypertension   . Acute MI   . Dyslipidemia   . Hx of pulmonary embolus    Past Surgical History  Procedure Date  . Cardiac surgery   . Coronary artery bypass graft   . Coronary stent placement     Medications: Prior to Admission medications   Medication Sig Start Date End Date Taking? Authorizing Provider  aspirin 325 MG tablet Take 325 mg by mouth daily.   Yes Historical Provider, MD  glucosamine-chondroitin 500-400 MG tablet Take 1  tablet by mouth 3 (three) times daily.   Yes Historical Provider, MD  losartan (COZAAR) 50 MG tablet Take 50 mg by mouth daily.   Yes Historical Provider, MD  metoprolol succinate (TOPROL-XL) 50 MG 24 hr tablet Take 25 mg by mouth daily. Take with or immediately following a meal.   Yes Historical Provider, MD  Multiple Vitamin (MULITIVITAMIN WITH MINERALS) TABS Take 1 tablet by mouth daily.   Yes Historical Provider, MD  nitroGLYCERIN (NITROSTAT) 0.4 MG SL tablet Place 0.4 mg under the tongue every 5 (five) minutes as needed. For chest pain   Yes Historical Provider, MD  ondansetron (ZOFRAN) 8 MG tablet Take by mouth every 8 (eight) hours as needed.   Yes Historical Provider, MD  pantoprazole (PROTONIX) 40 MG tablet Take 40 mg by mouth daily.   Yes Historical Provider, MD  rosuvastatin (CRESTOR) 40 MG tablet Take 40 mg by mouth daily.   Yes Historical Provider, MD  triamcinolone cream (KENALOG) 0.5 % Apply 1 application topically 2 (two) times daily.   Yes Historical Provider, MD    Allergies:   Allergies  Allergen Reactions  . Ace Inhibitors Cough  . Lipitor (Atorvastatin Calcium) Other (See Comments)    myalgias  . Simvastatin Other (See Comments)    Myalgias   . Advicor Rash    Social History:  reports that he has never smoked. He does not have any smokeless tobacco history on file. He reports that he does not drink alcohol or use illicit drugs.  Family History: No family history on file.  Physical Exam: Filed Vitals:   05/23/11 0105 05/23/11 0130  05/23/11 0145 05/23/11 0200  BP: 109/78 135/71 119/73 109/69  Pulse: 51 54 55 52  Temp:      TempSrc:      Resp: 18 15 14 13   SpO2: 97% 99% 98% 97%    General:  Alert and oriented times three, well developed and nourished, no acute distress Eyes: PERRLA, pink conjunctiva, no scleral icterus ENT: Moist oral mucosa, neck supple, no thyromegaly Lungs: clear to ascultation, no wheeze, no crackles, no use of accessory  muscles Cardiovascular: regular rate and rhythm, no regurgitation, no gallops, no murmurs. No carotid bruits, no JVD Abdomen: soft, positive BS, non-tender, non-distended, no organomegaly, not an acute abdomen GU: not examined Neuro: CN II - XII grossly intact, sensation intact Musculoskeletal: strength 5/5 all extremities, no clubbing, cyanosis or edema, mild reproducible to the chest wall pain Skin: no rash, no subcutaneous crepitation, no decubitus Psych: appropriate patient   Labs on Admission:   [facility] 05/22/11 2341  NA 138  K 4.3  CL 101  CO2 26  GLUCOSE 131*  BUN 24*  CREATININE 1.61*  CALCIUM 9.1  MG --  PHOS --    Basename 05/22/11 2341  AST 52*  ALT 56*  ALKPHOS 124*  BILITOT 0.5  PROT 8.5*  ALBUMIN 4.0    Basename 05/23/11 0142  LIPASE 41  AMYLASE --    Basename 05/22/11 2341  WBC 4.1  NEUTROABS 1.8  HGB 13.4  HCT 40.5  MCV 78.6  PLT 185    Basename 05/22/11 2342  CKTOTAL 135  CKMB 2.3  CKMBINDEX --  TROPONINI --  Results for URIAS, SHEEK (MRN [id number]) as of 05/23/2011 02:25  Ref. Range 05/22/2011 23:42 05/22/2011 23:50  CK, MB Latest Range: 0.3-4.0 ng/mL 2.3   CK Total Latest Range: 7-232 U/L 135   Troponin i, poc Latest Range: 0.00-0.08 ng/mL  0.01   No components found with this basename: POCBNP:3 No results found for this basename: DDIMER:2 in the last 72 hours No results found for this basename: HGBA1C:2 in the last 72 hours No results found for this basename: CHOL:2,HDL:2,LDLCALC:2,TRIG:2,CHOLHDL:2,LDLDIRECT:2 in the last 72 hours No results found for this basename: TSH,T4TOTAL,FREET3,T3FREE,THYROIDAB in the last 72 hours No results found for this basename: VITAMINB12:2,FOLATE:2,FERRITIN:2,TIBC:2,IRON:2,RETICCTPCT:2 in the last 72 hours  Micro Results: No results found for this or any previous visit (from the past 240 hour(s)).   Radiological Exams on Admission: Dg Chest Portable 1 View  05/23/2011  *RADIOLOGY REPORT*  Clinical Data:  Chest pain.Shortness of breath.  PORTABLE CHEST - 1 VIEW  Comparison: 12/13/2010.  Findings: Poor inspiration.  Pulmonary vascular congestion most notable centrally.  No segmental infiltrate or pneumothorax.  Mild cardiomegaly.  Mildly tortuous aorta.  IMPRESSION: Poor inspiration with mild cardiomegaly and pulmonary vascular prominence most notable centrally.  Slightly tortuous aorta.  Original Report Authenticated By: Fuller Canada, M.D.    EKG: Normal sinus rhythm  Assessment/Plan Present on Admission:  .AKI (acute kidney injury)/dehydration, rule out ATN Diarrhea Admit to telemetry IV fluid hydration BMP in the a.m. C. difficile toxin ordered ARB discontinued   I will order a urinalysis  .Chest pain Coronary artery disease with history of stent placement Cycle cardiac enzymes, aspirin daily Nitroglycerin when necessary  Unclear if this is cardiac versus muscular skeletal  .Diarrhea C. difficile toxins Hypertension ARB discontinued and beta blockers with hold parameters   dyslipidemia Crestor not on formulary held, patient allergic to statins   Full code DVT prophylaxis Team 2/Dr. Enos Fling, Aashi Derrington 05/23/2011,  2:25 AM

## 2011-05-23 NOTE — ED Notes (Signed)
pts daughter (May) leaving for the night. States her other sister will be staying, but if she has trouble understanding, she may be reached at [phone]

## 2011-05-23 NOTE — Progress Notes (Signed)
Pt's daughter Hme  called to ask if pt can eat something this morning, since he has not have anything this am.  Mention pt currently NPO and will inform MD.  Dr  Thurman Coyer  Text page MD.  Awaiting MD's return call.  Amanda Pea, Charity fundraiser.

## 2011-05-23 NOTE — Progress Notes (Signed)
   CARE MANAGEMENT NOTE 05/23/2011  Patient:  [name], [name]   Account Number:  [id number]  Date Initiated:  05/23/2011  Documentation initiated by:  Letha Cape  Subjective/Objective Assessment:   dx acute renal failure  admit- lives with spouse.  Daughter speaks [name] phone [phone].  pta independent, works and still drives.     Action/Plan:   Anticipated DC Date:  05/25/2011   Anticipated DC Plan:  HOME/SELF CARE      DC Planning Services  CM consult      Choice offered to / List presented to:             Status of service:  In process, will continue to follow Medicare Important Message given?   (If response is "NO", the following Medicare IM given date fields will be blank) Date Medicare IM given:   Date Additional Medicare IM given:    Discharge Disposition:    Per UR Regulation:    If discussed at Long Length of Stay Meetings, dates discussed:    Comments:  PCP Ehinger  05/23/11 14:17 Letha Cape RN, BSN [phone] patient lives with spouse and daughter [name], who speaks English her phone is [phone].  PTA patient was independent.  Patient has medication coverage and transportation.  No needs identified at this time.   NCM will continue to follow for dc needs.

## 2011-05-23 NOTE — ED Notes (Signed)
Report to pace rn. No further questions at this time

## 2011-05-23 NOTE — ED Provider Notes (Signed)
History     CSN: [id number]  Arrival date & time 05/22/11  2239   First MD Initiated Contact with Patient 05/23/11 0054      Chief Complaint  Patient presents with  . Chest Pain    (Consider location/radiation/quality/duration/timing/severity/associated sxs/prior treatment) The history is provided by the patient.   left-sided chest pain, sharp in quality and not radiating. Started a few days ago has been on and off and today has been persistent. Has associated shortness of breath. History of CABG in the past followed by [place] or cardiovascular. No fevers or cough. No leg pain or swelling. Patient denies history of similar pain. No recent change in medications.  Past Medical History  Diagnosis Date  . Coronary artery disease   . Hypertension   . Acute MI     Past Surgical History  Procedure Date  . Cardiac surgery   . Coronary artery bypass graft   . Coronary stent placement     No family history on file.  History  Substance Use Topics  . Smoking status: Never Smoker   . Smokeless tobacco: Not on file  . Alcohol Use: No      Review of Systems  Constitutional: Negative for fever and chills.  HENT: Negative for neck pain and neck stiffness.   Eyes: Negative for pain.  Respiratory: Positive for shortness of breath. Negative for cough.   Cardiovascular: Positive for chest pain. Negative for palpitations and leg swelling.  Gastrointestinal: Negative for abdominal pain.  Genitourinary: Negative for dysuria.  Musculoskeletal: Negative for back pain.  Skin: Negative for rash.  Neurological: Negative for headaches.  All other systems reviewed and are negative.    Allergies  Ace inhibitors; Lipitor; Simvastatin; and Advicor  Home Medications   Current Outpatient Rx  Name Route Sig Dispense Refill  . ASPIRIN 325 MG PO TABS Oral Take 325 mg by mouth daily.    Marland Kitchen GLUCOSAMINE-CHONDROITIN 500-400 MG PO TABS Oral Take 1 tablet by mouth 3 (three) times daily.    Marland Kitchen  LOSARTAN POTASSIUM 50 MG PO TABS Oral Take 50 mg by mouth daily.    Marland Kitchen METOPROLOL SUCCINATE ER 50 MG PO TB24 Oral Take 25 mg by mouth daily. Take with or immediately following a meal.    . ADULT MULTIVITAMIN W/MINERALS CH Oral Take 1 tablet by mouth daily.    Marland Kitchen NITROGLYCERIN 0.4 MG SL SUBL Sublingual Place 0.4 mg under the tongue every 5 (five) minutes as needed. For chest pain    . ONDANSETRON HCL 8 MG PO TABS Oral Take by mouth every 8 (eight) hours as needed.    Marland Kitchen PANTOPRAZOLE SODIUM 40 MG PO TBEC Oral Take 40 mg by mouth daily.    Marland Kitchen ROSUVASTATIN CALCIUM 40 MG PO TABS Oral Take 40 mg by mouth daily.    . TRIAMCINOLONE ACETONIDE 0.5 % EX CREA Topical Apply 1 application topically 2 (two) times daily.      BP 109/78  Pulse 51  Temp(Src) 98.1 F (36.7 C) (Oral)  Resp 18  SpO2 97%  Physical Exam  Constitutional: He is oriented to person, place, and time. He appears well-developed and well-nourished.  HENT:  Head: Normocephalic and atraumatic.  Eyes: Conjunctivae and EOM are normal. Pupils are equal, round, and reactive to light.  Neck: Trachea normal. Neck supple. No thyromegaly present.  Cardiovascular: Normal rate, regular rhythm, S1 normal, S2 normal and normal pulses.     No systolic murmur is present   No diastolic murmur is  present  Pulses:      Radial pulses are 2+ on the right side, and 2+ on the left side.  Pulmonary/Chest: Effort normal and breath sounds normal. He has no wheezes. He has no rhonchi. He has no rales. He exhibits no tenderness.  Abdominal: Soft. Normal appearance and bowel sounds are normal. There is no tenderness. There is no CVA tenderness and negative Murphy's sign.  Musculoskeletal:       BLE:s Calves nontender, no cords or erythema, negative Homans sign  Neurological: He is alert and oriented to person, place, and time. He has normal strength. No cranial nerve deficit or sensory deficit. GCS eye subscore is 4. GCS verbal subscore is 5. GCS motor subscore  is 6.  Skin: Skin is warm and dry. No rash noted. He is not diaphoretic.  Psychiatric: His speech is normal.       Cooperative and appropriate    ED Course  Procedures (including critical care time)  Labs Reviewed  DIFFERENTIAL - Abnormal; Notable for the following:    Monocytes Relative 14 (*)    All other components within normal limits  COMPREHENSIVE METABOLIC PANEL - Abnormal; Notable for the following:    Glucose, Bld 131 (*)    BUN 24 (*)    Creatinine, Ser 1.61 (*)    Total Protein 8.5 (*)    AST 52 (*)    ALT 56 (*)    Alkaline Phosphatase 124 (*)    GFR calc non Af Amer 41 (*)    GFR calc Af Amer 48 (*)    All other components within normal limits  CBC  CK TOTAL AND CKMB  POCT I-STAT TROPONIN I   Dg Chest Portable 1 View  05/23/2011  *RADIOLOGY REPORT*  Clinical Data: Chest pain.Shortness of breath.  PORTABLE CHEST - 1 VIEW  Comparison: 12/13/2010.  Findings: Poor inspiration.  Pulmonary vascular congestion most notable centrally.  No segmental infiltrate or pneumothorax.  Mild cardiomegaly.  Mildly tortuous aorta.  IMPRESSION: Poor inspiration with mild cardiomegaly and pulmonary vascular prominence most notable centrally.  Slightly tortuous aorta.  Original Report Authenticated By: Fuller Canada, M.D.     1. Chest pain     Date: 05/23/2011  Rate: 62  Rhythm: normal sinus rhythm  QRS Axis: normal  Intervals: normal  ST/T Wave abnormalities: nonspecific ST/T changes  Conduction Disutrbances:none  Narrative Interpretation:   Old EKG Reviewed: unchanged   1:39 AM is discussed with Dr. Joneen Roach, on call for hospitalist - agrees to evaluation at this time for admission  MDM   Chest pain with history of coronary artery disease presentation does not suggest etiology for symptoms otherwise - no heartburn, no pleuritic pain or tachycardia to suggest PE and no DVT symptoms, no radiating or tearing pain to suggest dissection and no hypertension in the ED, no fever  cough, no pneumothorax on x-ray. Abdominal tenderness to suggest intra-abdominal etiology . Labs obtained and reviewed as above. EKG reviewed. Medicine consult for admission for further evaluation.        Sunnie Nielsen, MD 05/23/11 [phone]

## 2011-05-23 NOTE — ED Notes (Signed)
Pt states he has had intermittent CP for the past 2-3 days.  Pain is in lower left chest, describes as a throbbing pain, like a rubber band.  States pain is accompanied with SOB, denies n/v, no diaphoresis.

## 2011-05-24 HISTORY — PX: TRANSTHORACIC ECHOCARDIOGRAM: SHX275

## 2011-05-24 LAB — URINALYSIS, ROUTINE W REFLEX MICROSCOPIC
Bilirubin Urine: NEGATIVE
Ketones, ur: NEGATIVE mg/dL
Nitrite: NEGATIVE
Specific Gravity, Urine: 1.011 (ref 1.005–1.030)
Urobilinogen, UA: 0.2 mg/dL (ref 0.0–1.0)

## 2011-05-24 NOTE — Progress Notes (Signed)
  Echocardiogram 2D Echocardiogram has been performed.  [name] L 05/24/2011, 2:30 PM

## 2011-05-24 NOTE — Discharge Summary (Signed)
Admit date: 05/22/2011 Discharge date: 05/24/2011  Primary Care Physician:  Thora Lance, MD, MD   Discharge Diagnoses:   Active Hospital Problems  Diagnoses Date Noted   . CAD (coronary artery disease) 05/23/2011   . Hypertension 05/23/2011   . Dyslipidemia 05/23/2011     Resolved Hospital Problems  Diagnoses Date Noted Date Resolved  . AKI (acute kidney injury) 05/23/2011 05/24/2011  . Chest pain 05/23/2011 05/24/2011  . Diarrhea 05/23/2011 05/24/2011     DISCHARGE MEDICATION: Medication List  As of 05/24/2011 11:21 AM   TAKE these medications         aspirin 325 MG tablet   Take 325 mg by mouth daily.      glucosamine-chondroitin 500-400 MG tablet   Take 1 tablet by mouth 3 (three) times daily.      losartan 50 MG tablet   Commonly known as: COZAAR   Take 50 mg by mouth daily.      metoprolol succinate 50 MG 24 hr tablet   Commonly known as: TOPROL-XL   Take 25 mg by mouth daily. Take with or immediately following a meal.      mulitivitamin with minerals Tabs   Take 1 tablet by mouth daily.      nitroGLYCERIN 0.4 MG SL tablet   Commonly known as: NITROSTAT   Place 0.4 mg under the tongue every 5 (five) minutes as needed. For chest pain      ondansetron 8 MG tablet   Commonly known as: ZOFRAN   Take by mouth every 8 (eight) hours as needed.      pantoprazole 40 MG tablet   Commonly known as: PROTONIX   Take 40 mg by mouth daily.      rosuvastatin 40 MG tablet   Commonly known as: CRESTOR   Take 40 mg by mouth daily.      triamcinolone cream 0.5 %   Commonly known as: KENALOG   Apply 1 application topically 2 (two) times daily.              Consults:     SIGNIFICANT DIAGNOSTIC STUDIES:  Dg Chest Portable 1 View  05/23/2011  *RADIOLOGY REPORT*  Clinical Data: Chest pain.Shortness of breath.  PORTABLE CHEST - 1 VIEW  Comparison: 12/13/2010.  Findings: Poor inspiration.  Pulmonary vascular congestion most notable centrally.  No segmental  infiltrate or pneumothorax.  Mild cardiomegaly.  Mildly tortuous aorta.  IMPRESSION: Poor inspiration with mild cardiomegaly and pulmonary vascular prominence most notable centrally.  Slightly tortuous aorta.  Original Report Authenticated By: Fuller Canada, M.D.     ECHO: Pending at the time of this dictationDURES:   No results found for this or any previous visit (from the past 240 hour(s)).  BRIEF ADMITTING H & P: 71 year old male who 3 days ago developed left-sided chest pains. It's described as dull but constant and 7/10 at its worse. He states he has palpitations. Pain is worse with bending. He does report shortness of breath. He has a history of coronary artery disease with stent placement. He states pain is similar to when he had his MI. Reports chest pain is relieved with nitroglycerin. His last stress test approximately a year ago. He states never gets chest pain.  He reports fevers and and nausea but no vomiting. He is diarrhea yesterday but none today. Reports decreased urine output today. He denies any wheezing or any cough he states medic he is compliant with medications.  The patient speaks only [place] dialect.  Family was present at bedside to translate.    Active Hospital Problems  Diagnoses Date Noted   . CAD (coronary artery disease): Continue aspirin beta blocker.  05/23/2011   . Hypertension: Continue home meds no changes were made.  05/23/2011   . Dyslipidemia: Continue home meds no changes were made.  05/23/2011     Resolved Hospital Problems  Diagnoses Date Noted Date Resolved  . AKI (acute kidney injury): Does resolve with IV fluids is probably secondary to his decreased intravascular volume secondary to his diarrhea.  05/23/2011 05/24/2011  . Chest pain: Cardiac enzymes were cycled which were negative x3 his EKG is unchanged from previous EKGs. On my physical examination he actually had left upper quadrant pain associated with his diarrhea. His  diarrhea is now resolved as well as his chest pain. Most likely he was confusing this with chest pain. Had an episode of infectious colitis now resolved. A 2-D echo is pending at the time of this dictation. The family will followup with his cardiologist an outpatient.  05/23/2011 05/24/2011  . Diarrhea: This resolved. This probably a viral gastroenteritis.  05/23/2011 05/24/2011     Disposition and Follow-up:  Followup with her cardiologist in 2 weeks. Discharge Orders    Future Orders Please Complete By Expires   Diet - low sodium heart healthy      Increase activity slowly        Follow-up Information    Follow up with Thora Lance, MD in 2 weeks.          DISCHARGE EXAM:  See progress note.  Blood pressure 142/65, pulse 58, temperature 98.5 F (36.9 C), temperature source Oral, resp. rate 16, weight 69 kg (152 lb 1.9 oz), SpO2 96.00%.   Basename 05/23/11 0400 05/22/11 2341  NA 137 138  K 4.2 4.3  CL 104 101  CO2 23 26  GLUCOSE 89 131*  BUN 22 24*  CREATININE 1.34 1.61*  CALCIUM 8.3* 9.1  MG -- --  PHOS -- --    Basename 05/22/11 2341  AST 52*  ALT 56*  ALKPHOS 124*  BILITOT 0.5  PROT 8.5*  ALBUMIN 4.0    Basename 05/23/11 0142  LIPASE 41  AMYLASE --    Basename 05/23/11 0400 05/22/11 2341  WBC 5.2 4.1  NEUTROABS -- 1.8  HGB 11.2* 13.4  HCT 33.5* 40.5  MCV 78.6 78.6  PLT 151 185    Signed: Marinda Elk M.D. 05/24/2011, 11:21 AM

## 2011-05-24 NOTE — Progress Notes (Signed)
Subjective: No complains.  Objective: Filed Vitals:   05/23/11 1126 05/23/11 1500 05/23/11 2212 05/24/11 0538  BP: 118/64 117/68 119/65 142/65  Pulse: 59 60 67 58  Temp:  98.1 F (36.7 C) 97.7 F (36.5 C) 98.5 F (36.9 C)  TempSrc:  Oral    Resp:  18 18 16   Weight:    69 kg (152 lb 1.9 oz)  SpO2:  96% 97% 96%   Weight change: 1.1 kg (2 lb 6.8 oz)  Intake/Output Summary (Last 24 hours) at 05/24/11 1113 Last data filed at 05/24/11 0909  Gross per 24 hour  Intake   1500 ml  Output   1326 ml  Net    174 ml    General: Alert, awake, oriented x3, in no acute distress.  HEENT: No bruits, no goiter.  Heart: Regular rate and rhythm, without murmurs, rubs, gallops.  Lungs: Good air movement. CTA b/l Abdomen: Soft, nontender, nondistended, positive bowel sounds.  Neuro: Grossly intact, nonfocal.   Lab Results:  Basename 05/23/11 0400 05/22/11 2341  NA 137 138  K 4.2 4.3  CL 104 101  CO2 23 26  GLUCOSE 89 131*  BUN 22 24*  CREATININE 1.34 1.61*  CALCIUM 8.3* 9.1  MG -- --  PHOS -- --    Basename 05/22/11 2341  AST 52*  ALT 56*  ALKPHOS 124*  BILITOT 0.5  PROT 8.5*  ALBUMIN 4.0    Basename 05/23/11 0142  LIPASE 41  AMYLASE --    Basename 05/23/11 0400 05/22/11 2341  WBC 5.2 4.1  NEUTROABS -- 1.8  HGB 11.2* 13.4  HCT 33.5* 40.5  MCV 78.6 78.6  PLT 151 185    Basename 05/23/11 1525 05/23/11 0956 05/23/11 0401 05/22/11 2342  CKTOTAL -- -- -- 135  CKMB -- -- -- 2.3  CKMBINDEX -- -- -- --  TROPONINI <0.30 <0.30 <0.30 --   No components found with this basename: POCBNP:3 No results found for this basename: DDIMER:2 in the last 72 hours No results found for this basename: HGBA1C:2 in the last 72 hours No results found for this basename: CHOL:2,HDL:2,LDLCALC:2,TRIG:2,CHOLHDL:2,LDLDIRECT:2 in the last 72 hours No results found for this basename: TSH,T4TOTAL,FREET3,T3FREE,THYROIDAB in the last 72 hours No results found for this basename:  VITAMINB12:2,FOLATE:2,FERRITIN:2,TIBC:2,IRON:2,RETICCTPCT:2 in the last 72 hours  Micro Results: No results found for this or any previous visit (from the past 240 hour(s)).  Studies/Results: Dg Chest Portable 1 View  05/23/2011  *RADIOLOGY REPORT*  Clinical Data: Chest pain.Shortness of breath.  PORTABLE CHEST - 1 VIEW  Comparison: 12/13/2010.  Findings: Poor inspiration.  Pulmonary vascular congestion most notable centrally.  No segmental infiltrate or pneumothorax.  Mild cardiomegaly.  Mildly tortuous aorta.  IMPRESSION: Poor inspiration with mild cardiomegaly and pulmonary vascular prominence most notable centrally.  Slightly tortuous aorta.  Original Report Authenticated By: Fuller Canada, M.D.    Medications: I have reviewed the patient's current medications.  Assessment and plan: Principal Problem:  *AKI (acute kidney injury): Resolved 2/2 to diarhea.  Active Problems:  Chest pain: He relates his pain was in the left upper quadrant. Cardiac enzymes - x 3. ECG NSR with T inversion in lead I which compared to previous ECG look unchanged. Echo pending.   CAD (coronary artery disease) Continue asa and beta blockers.   Diarrhea Resolved. He relates his pain was in the left upper quadrant   Hypertension: Controlled.   Dyslipidemia Follow up with cards.      LOS: 2 days   FELIZ  Rosine Beat M.D. Pager: [phone] [facility] 05/24/2011, 11:13 AM

## 2011-05-25 NOTE — Progress Notes (Signed)
   CARE MANAGEMENT NOTE 05/25/2011  Patient:  [name], [name]   Account Number:  [id number]  Date Initiated:  05/23/2011  Documentation initiated by:  Letha Cape  Subjective/Objective Assessment:   dx acute renal failure  admit- lives with spouse.  Daughter speaks [name] phone [phone].  pta independent, works and still drives.     Action/Plan:   Anticipated DC Date:  05/24/2011   Anticipated DC Plan:  HOME/SELF CARE      DC Planning Services  CM consult      Choice offered to / List presented to:             Status of service:  Completed, signed off Medicare Important Message given?   (If response is "NO", the following Medicare IM given date fields will be blank) Date Medicare IM given:   Date Additional Medicare IM given:    Discharge Disposition:  HOME/SELF CARE  Per UR Regulation:    If discussed at Long Length of Stay Meetings, dates discussed:    Comments:  PCP Ehinger  05/24/11 8:53 Letha Cape RN, BSN [phone] patient dc to home.  05/23/11 14:17 Letha Cape RN, BSN [phone] patient lives with spouse and daughter [name], who speaks English her phone is [phone].  PTA patient was independent.  Patient has medication coverage and transportation.  No needs identified at this time.   NCM will continue to follow for dc needs.

## 2011-07-24 ENCOUNTER — Other Ambulatory Visit: Payer: Self-pay | Admitting: Surgery

## 2012-02-27 ENCOUNTER — Ambulatory Visit
Admission: RE | Admit: 2012-02-27 | Discharge: 2012-02-27 | Disposition: A | Discharge status: Home / Self Care | Payer: Medicare Other | Source: Ambulatory Visit | Attending: Otolaryngology | Admitting: Otolaryngology

## 2012-02-27 ENCOUNTER — Other Ambulatory Visit: Payer: Self-pay | Admitting: Otolaryngology

## 2012-02-27 DIAGNOSIS — R221 Localized swelling, mass and lump, neck: Secondary | ICD-10-CM

## 2012-06-26 ENCOUNTER — Telehealth: Payer: Self-pay | Admitting: Internal Medicine

## 2012-06-26 NOTE — Telephone Encounter (Signed)
Left msg for patient to give me a call to schedule his appointment.

## 2012-12-27 ENCOUNTER — Other Ambulatory Visit: Payer: Self-pay | Admitting: Gastroenterology

## 2012-12-27 ENCOUNTER — Encounter: Payer: Self-pay | Admitting: *Deleted

## 2013-01-01 ENCOUNTER — Encounter: Payer: Self-pay | Admitting: Internal Medicine

## 2013-01-02 ENCOUNTER — Ambulatory Visit: Payer: Medicare Other | Admitting: Internal Medicine

## 2013-03-04 ENCOUNTER — Ambulatory Visit: Payer: Medicare Other | Admitting: Internal Medicine

## 2014-01-07 ENCOUNTER — Other Ambulatory Visit: Payer: Self-pay | Admitting: Cardiology

## 2014-01-07 ENCOUNTER — Ambulatory Visit
Admission: RE | Admit: 2014-01-07 | Discharge: 2014-01-07 | Disposition: A | Discharge status: Home / Self Care | Payer: Medicare Other | Source: Ambulatory Visit | Attending: Cardiology | Admitting: Cardiology

## 2014-01-07 DIAGNOSIS — J8410 Pulmonary fibrosis, unspecified: Secondary | ICD-10-CM

## 2015-05-06 ENCOUNTER — Other Ambulatory Visit: Payer: Self-pay | Admitting: Family Medicine

## 2015-05-06 ENCOUNTER — Ambulatory Visit
Admission: RE | Admit: 2015-05-06 | Discharge: 2015-05-06 | Disposition: A | Discharge status: Home / Self Care | Payer: Medicare Other | Source: Ambulatory Visit | Attending: Family Medicine | Admitting: Family Medicine

## 2015-05-06 DIAGNOSIS — M5489 Other dorsalgia: Secondary | ICD-10-CM

## 2016-01-11 ENCOUNTER — Ambulatory Visit
Admission: RE | Admit: 2016-01-11 | Discharge: 2016-01-11 | Disposition: A | Discharge status: Home / Self Care | Payer: Medicare Other | Source: Ambulatory Visit | Attending: Family Medicine | Admitting: Family Medicine

## 2016-01-11 ENCOUNTER — Other Ambulatory Visit: Payer: Self-pay | Admitting: Family Medicine

## 2016-01-11 DIAGNOSIS — R059 Cough, unspecified: Secondary | ICD-10-CM

## 2016-01-11 DIAGNOSIS — R05 Cough: Secondary | ICD-10-CM

## 2017-05-11 ENCOUNTER — Observation Stay (HOSPITAL_COMMUNITY)
Admission: EM | Admit: 2017-05-11 | Discharge: 2017-05-12 | Disposition: A | Discharge status: Home / Self Care | Payer: Medicare Other | Attending: Cardiology | Admitting: Cardiology

## 2017-05-11 ENCOUNTER — Inpatient Hospital Stay: Admit: 2017-05-11 | Payer: Self-pay | Admitting: Cardiology

## 2017-05-11 ENCOUNTER — Ambulatory Visit (HOSPITAL_COMMUNITY)
Admission: EM | Disposition: A | Discharge status: Home / Self Care | Payer: Self-pay | Source: Home / Self Care | Attending: Emergency Medicine

## 2017-05-11 ENCOUNTER — Ambulatory Visit (HOSPITAL_COMMUNITY): Admit: 2017-05-11 | Payer: Medicare Other | Admitting: Cardiology

## 2017-05-11 ENCOUNTER — Encounter (HOSPITAL_COMMUNITY): Payer: Self-pay | Admitting: Emergency Medicine

## 2017-05-11 ENCOUNTER — Emergency Department (HOSPITAL_COMMUNITY): Payer: Medicare Other

## 2017-05-11 ENCOUNTER — Other Ambulatory Visit: Payer: Self-pay

## 2017-05-11 DIAGNOSIS — I25110 Atherosclerotic heart disease of native coronary artery with unstable angina pectoris: Principal | ICD-10-CM | POA: Insufficient documentation

## 2017-05-11 DIAGNOSIS — Z955 Presence of coronary angioplasty implant and graft: Secondary | ICD-10-CM | POA: Diagnosis not present

## 2017-05-11 DIAGNOSIS — I209 Angina pectoris, unspecified: Secondary | ICD-10-CM | POA: Diagnosis present

## 2017-05-11 DIAGNOSIS — Z87891 Personal history of nicotine dependence: Secondary | ICD-10-CM | POA: Insufficient documentation

## 2017-05-11 DIAGNOSIS — I10 Essential (primary) hypertension: Secondary | ICD-10-CM | POA: Diagnosis not present

## 2017-05-11 DIAGNOSIS — K219 Gastro-esophageal reflux disease without esophagitis: Secondary | ICD-10-CM | POA: Diagnosis not present

## 2017-05-11 DIAGNOSIS — I252 Old myocardial infarction: Secondary | ICD-10-CM | POA: Insufficient documentation

## 2017-05-11 DIAGNOSIS — M1990 Unspecified osteoarthritis, unspecified site: Secondary | ICD-10-CM | POA: Diagnosis not present

## 2017-05-11 DIAGNOSIS — E785 Hyperlipidemia, unspecified: Secondary | ICD-10-CM | POA: Diagnosis not present

## 2017-05-11 DIAGNOSIS — I200 Unstable angina: Secondary | ICD-10-CM

## 2017-05-11 DIAGNOSIS — Z86711 Personal history of pulmonary embolism: Secondary | ICD-10-CM | POA: Insufficient documentation

## 2017-05-11 DIAGNOSIS — R7611 Nonspecific reaction to tuberculin skin test without active tuberculosis: Secondary | ICD-10-CM | POA: Insufficient documentation

## 2017-05-11 DIAGNOSIS — R079 Chest pain, unspecified: Secondary | ICD-10-CM | POA: Diagnosis present

## 2017-05-11 DIAGNOSIS — I255 Ischemic cardiomyopathy: Secondary | ICD-10-CM | POA: Diagnosis not present

## 2017-05-11 HISTORY — PX: CORONARY PRESSURE/FFR STUDY: CATH118243

## 2017-05-11 HISTORY — DX: Unstable angina: I20.0

## 2017-05-11 HISTORY — PX: LEFT HEART CATH AND CORONARY ANGIOGRAPHY: CATH118249

## 2017-05-11 LAB — CBC
HCT: 44.3 % (ref 39.0–52.0)
HEMOGLOBIN: 15 g/dL (ref 13.0–17.0)
MCH: 26.9 pg (ref 26.0–34.0)
MCHC: 33.9 g/dL (ref 30.0–36.0)
MCV: 79.5 fL (ref 78.0–100.0)
Platelets: 177 10*3/uL (ref 150–400)
RBC: 5.57 MIL/uL (ref 4.22–5.81)
RDW: 14.1 % (ref 11.5–15.5)
WBC: 5.3 10*3/uL (ref 4.0–10.5)

## 2017-05-11 LAB — BASIC METABOLIC PANEL
ANION GAP: 11 (ref 5–15)
BUN: 12 mg/dL (ref 6–20)
CALCIUM: 9.6 mg/dL (ref 8.9–10.3)
CO2: 25 mmol/L (ref 22–32)
Chloride: 103 mmol/L (ref 101–111)
Creatinine, Ser: 1.3 mg/dL — ABNORMAL HIGH (ref 0.61–1.24)
GFR calc Af Amer: 59 mL/min — ABNORMAL LOW (ref >60)
GFR calc non Af Amer: 51 mL/min — ABNORMAL LOW (ref >60)
GLUCOSE: 168 mg/dL — AB (ref 65–99)
Potassium: 3.8 mmol/L (ref 3.5–5.1)
Sodium: 139 mmol/L (ref 135–145)

## 2017-05-11 LAB — TROPONIN I

## 2017-05-11 LAB — I-STAT TROPONIN, ED: TROPONIN I, POC: 0 ng/mL (ref 0.00–0.08)

## 2017-05-11 SURGERY — LEFT HEART CATH AND CORONARY ANGIOGRAPHY
Anesthesia: LOCAL

## 2017-05-11 MED ORDER — ACETAMINOPHEN 325 MG PO TABS
650.0000 mg | ORAL_TABLET | ORAL | Status: DC | PRN
  Administered 2017-05-11: 20:00:00 650 mg via ORAL
  Filled 2017-05-11: qty 2

## 2017-05-11 MED ORDER — HEPARIN (PORCINE) IN NACL 2-0.9 UNIT/ML-% IJ SOLN
INTRAMUSCULAR | Status: DC | PRN
  Administered 2017-05-11: 5 mL via INTRA_ARTERIAL

## 2017-05-11 MED ORDER — NITROGLYCERIN 1 MG/10 ML FOR IR/CATH LAB
INTRA_ARTERIAL | Status: DC | PRN
  Administered 2017-05-11: 100 ug via INTRACORONARY

## 2017-05-11 MED ORDER — FENTANYL CITRATE (PF) 100 MCG/2ML IJ SOLN
INTRAMUSCULAR | Status: DC | PRN
  Administered 2017-05-11: 25 ug via INTRAVENOUS

## 2017-05-11 MED ORDER — SODIUM CHLORIDE 0.9% FLUSH: 3.0000 mL | Freq: Two times a day (BID) | INTRAVENOUS | Status: DC

## 2017-05-11 MED ORDER — SODIUM CHLORIDE 0.9% FLUSH: 3.0000 mL | INTRAVENOUS | Status: DC | PRN

## 2017-05-11 MED ORDER — SODIUM CHLORIDE 0.9 % IV SOLN: 250.0000 mL | INTRAVENOUS | Status: DC | PRN

## 2017-05-11 MED ORDER — LIDOCAINE HCL 1 % IJ SOLN
INTRAMUSCULAR | Status: AC
  Filled 2017-05-11: qty 20

## 2017-05-11 MED ORDER — GLUCOSAMINE-CHONDROITIN 500-400 MG PO TABS: 1.0000 | ORAL_TABLET | Freq: Three times a day (TID) | ORAL | Status: DC

## 2017-05-11 MED ORDER — NITROGLYCERIN IN D5W 200-5 MCG/ML-% IV SOLN
2.0000 ug/min | INTRAVENOUS | Status: DC
  Administered 2017-05-11: 5 ug/min via INTRAVENOUS
  Filled 2017-05-11: qty 250

## 2017-05-11 MED ORDER — LOSARTAN POTASSIUM 50 MG PO TABS
50.0000 mg | ORAL_TABLET | Freq: Every day | ORAL | Status: DC
  Administered 2017-05-11 – 2017-05-12 (×2): 50 mg via ORAL
  Filled 2017-05-11 (×2): qty 1

## 2017-05-11 MED ORDER — NITROGLYCERIN 1 MG/10 ML FOR IR/CATH LAB
INTRA_ARTERIAL | Status: AC
  Filled 2017-05-11: qty 10

## 2017-05-11 MED ORDER — SODIUM CHLORIDE 0.9 % IV SOLN: INTRAVENOUS | Status: AC

## 2017-05-11 MED ORDER — ADENOSINE 12 MG/4ML IV SOLN
INTRAVENOUS | Status: AC
  Filled 2017-05-11: qty 16

## 2017-05-11 MED ORDER — LIDOCAINE HCL (PF) 1 % IJ SOLN
INTRAMUSCULAR | Status: DC | PRN
  Administered 2017-05-11: 15 mL

## 2017-05-11 MED ORDER — IOPAMIDOL (ISOVUE-370) INJECTION 76%
INTRAVENOUS | Status: AC
  Filled 2017-05-11: qty 125

## 2017-05-11 MED ORDER — FENTANYL CITRATE (PF) 100 MCG/2ML IJ SOLN
INTRAMUSCULAR | Status: AC
  Filled 2017-05-11: qty 2

## 2017-05-11 MED ORDER — HEPARIN (PORCINE) IN NACL 2-0.9 UNIT/ML-% IJ SOLN
INTRAMUSCULAR | Status: AC | PRN
  Administered 2017-05-11 (×2): 500 mL

## 2017-05-11 MED ORDER — ONDANSETRON HCL 4 MG/2ML IJ SOLN: 4.0000 mg | Freq: Four times a day (QID) | INTRAMUSCULAR | Status: DC | PRN

## 2017-05-11 MED ORDER — ROSUVASTATIN CALCIUM 40 MG PO TABS
40.0000 mg | ORAL_TABLET | Freq: Every day | ORAL | Status: DC
  Administered 2017-05-12: 09:00:00 40 mg via ORAL
  Filled 2017-05-11: qty 2
  Filled 2017-05-11: qty 1

## 2017-05-11 MED ORDER — HEPARIN (PORCINE) IN NACL 2-0.9 UNIT/ML-% IJ SOLN
INTRAMUSCULAR | Status: AC
  Filled 2017-05-11: qty 500

## 2017-05-11 MED ORDER — IOHEXOL 350 MG/ML SOLN
INTRAVENOUS | Status: DC | PRN
  Administered 2017-05-11: 120 mL via INTRA_ARTERIAL

## 2017-05-11 MED ORDER — MIDAZOLAM HCL 2 MG/2ML IJ SOLN
INTRAMUSCULAR | Status: AC
  Filled 2017-05-11: qty 2

## 2017-05-11 MED ORDER — VERAPAMIL HCL 2.5 MG/ML IV SOLN
INTRAVENOUS | Status: AC
  Filled 2017-05-11: qty 2

## 2017-05-11 MED ORDER — ASPIRIN 81 MG PO CHEW
81.0000 mg | CHEWABLE_TABLET | Freq: Every day | ORAL | Status: DC
  Administered 2017-05-12: 09:00:00 81 mg via ORAL
  Filled 2017-05-11: qty 1

## 2017-05-11 MED ORDER — HEPARIN SODIUM (PORCINE) 1000 UNIT/ML IJ SOLN
INTRAMUSCULAR | Status: DC | PRN
  Administered 2017-05-11: 3500 [IU] via INTRAVENOUS
  Administered 2017-05-11: 3000 [IU] via INTRAVENOUS
  Administered 2017-05-11: 2000 [IU] via INTRAVENOUS

## 2017-05-11 MED ORDER — MIDAZOLAM HCL 2 MG/2ML IJ SOLN
INTRAMUSCULAR | Status: DC | PRN
  Administered 2017-05-11: 1 mg via INTRAVENOUS

## 2017-05-11 MED ORDER — METOPROLOL SUCCINATE ER 25 MG PO TB24
25.0000 mg | ORAL_TABLET | Freq: Every day | ORAL | Status: DC
  Administered 2017-05-12: 25 mg via ORAL
  Filled 2017-05-11: qty 1

## 2017-05-11 SURGICAL SUPPLY — 14 items
CATH INFINITI 5 FR RCB (CATHETERS) IMPLANT
CATH INFINITI JR4 5F (CATHETERS) ×2 IMPLANT
CATH LAUNCHER 6FR JL3 (CATHETERS) ×2 IMPLANT
CATH MICROCATH NAVVUS (MICROCATHETER) ×1 IMPLANT
GLIDESHEATH SLEND A-KIT 6F 20G (SHEATH) ×2 IMPLANT
GUIDEWIRE INQWIRE 1.5J.035X260 (WIRE) ×1 IMPLANT
INQWIRE 1.5J .035X260CM (WIRE) ×2
KIT ENCORE 26 ADVANTAGE (KITS) ×2 IMPLANT
KIT HEART LEFT (KITS) ×2 IMPLANT
MICROCATHETER NAVVUS (MICROCATHETER) ×2
PACK CARDIAC CATHETERIZATION (CUSTOM PROCEDURE TRAY) ×2 IMPLANT
TRANSDUCER W/STOPCOCK (MISCELLANEOUS) ×2 IMPLANT
TUBING CIL FLEX 10 FLL-RA (TUBING) ×2 IMPLANT
WIRE ASAHI PROWATER 180CM (WIRE) ×2 IMPLANT

## 2017-05-11 NOTE — ED Notes (Signed)
Patient transported to x-ray. ?

## 2017-05-11 NOTE — ED Provider Notes (Signed)
[facility] Provider Note   CSN: [id number] Arrival date & time: 05/11/17  1307     History   Chief Complaint Chief Complaint  Patient presents with  . Chest Pain    HPI [name] is a 77 y.o. male.  Chief complaint is chest pain.  HPI: 77 year old male.  Originates from [place].  Speaks [place].  Daughter is with him.  She prefers to translate and declines formal translation.  He has had chest pain since this morning.  Was seen in his cardiologist office earlier today and referred here for planned angiogram.  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  Most recent cath 2012 showed progression, however no interventions.  Continued medical management.  Started with chest pain last night.  This morning at 11 AM had sudden onset of chest pain.  Given aspirin and 2 sublingual nitroglycerin at cardiologist office and referred here.     Past Medical History:  Diagnosis Date  . Coronary artery disease    balloon angioplasty of diagonal branch of LAD in 2005  . Dyslipidemia   . GERD (gastroesophageal reflux disease)   . History of nuclear stress test 10/21/2009   dipyridamole; mod perfusion defect due to infarct/scar with mild periinfarct ischemia in mid ant/apical ant/mid anterolateral/apical lateral; no significant ischemia   . Hx of pulmonary embolus   . Hypertension   . Ischemic cardiomyopathy    h/o  . MI (myocardial infarction) ([place]) 07/09/2003   anterior   . OA (osteoarthritis)   . 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 Unstable angina ([place]) 05/11/2017  . CAD (coronary artery disease) 05/23/2011  . Hypertension 05/23/2011  . Dyslipidemia 05/23/2011    Past Surgical History:  Procedure Laterality Date  . CARDIAC CATHETERIZATION  07/08/2003   2.25x20 Maverick balloon angioplasty of diagonal 2 branch of LAD (Dr. Lowella Dell)  . CARDIAC CATHETERIZATION  03/25/2004   diagonal vessel  patent w/40% restenosis, 50-60% mid LAD stenosis (Dr. Jackie Plum)  . CARDIAC CATHETERIZATION  07/23/2006   no significant change from 2006 cath (Dr. Domenic Moras)   . CARDIAC SURGERY    . CORONARY ARTERY BYPASS GRAFT    . TRANSTHORACIC ECHOCARDIOGRAM  05/24/2011   EF 55-60%, mild LVH, grade 1 diastolic dysfunction; AV sclerosis; normal CVP        Home Medications    Prior to Admission medications   Medication Sig Start Date End Date Taking? Authorizing Provider  aspirin 325 MG tablet Take 325 mg by mouth daily.    [provider]  glucosamine-chondroitin 500-400 MG tablet Take 1 tablet by mouth 3 (three) times daily.    [provider]  losartan (COZAAR) 50 MG tablet Take 50 mg by mouth daily.    [provider]  metoprolol succinate (TOPROL-XL) 50 MG 24 hr tablet Take 25 mg by mouth daily. Take with or immediately following a meal.    [provider]  Multiple Vitamin (MULITIVITAMIN WITH MINERALS) TABS Take 1 tablet by mouth daily.    [provider]  nitroGLYCERIN (NITROSTAT) 0.4 MG SL tablet Place 0.4 mg under the tongue every 5 (five) minutes as needed. For chest pain    [provider]  ondansetron (ZOFRAN) 8 MG tablet Take by mouth every 8 (eight) hours as needed.    [provider]  pantoprazole (PROTONIX) 40 MG tablet Take 40 mg by mouth daily.  [provider]  rosuvastatin (CRESTOR) 40 MG tablet Take 40 mg by mouth daily.    [provider]  triamcinolone cream (KENALOG) 0.5 % Apply 1 application topically 2 (two) times daily.    [provider]    Family History No family history on file.  Social History Social History   Tobacco Use  . Smoking status: Former Smoker    Last attempt to quit: 12/28/1994    Years since quitting: 22.3  . Smokeless tobacco: Never Used  Substance Use Topics  . Alcohol use: No  . Drug use: No     Allergies   Ace inhibitors; Lipitor [atorvastatin  calcium]; Simvastatin; and Advicor [niacin-lovastatin er]   Review of Systems Review of Systems  Constitutional: Negative for appetite change, chills, diaphoresis, fatigue and fever.  HENT: Negative for mouth sores, sore throat and trouble swallowing.   Eyes: Negative for visual disturbance.  Respiratory: Negative for cough, chest tightness, shortness of breath and wheezing.   Cardiovascular: Positive for chest pain.  Gastrointestinal: Negative for abdominal distention, abdominal pain, diarrhea, nausea and vomiting.  Endocrine: Negative for polydipsia, polyphagia and polyuria.  Genitourinary: Negative for dysuria, frequency and hematuria.  Musculoskeletal: Negative for gait problem.  Skin: Negative for color change, pallor and rash.  Neurological: Negative for dizziness, syncope, light-headedness and headaches.  Hematological: Does not bruise/bleed easily.  Psychiatric/Behavioral: Negative for behavioral problems and confusion.     Physical Exam Updated Vital Signs BP 136/84   Pulse (!) 53   Temp 98.7 F (37.1 C) (Oral)   Resp 14   Ht 5\' 4"  (1.626 m)   Wt 68.9 kg (152 lb)   SpO2 97%   BMI 26.09 kg/m   Physical Exam  Constitutional: He is oriented to person, place, and time. He appears well-developed and well-nourished. No distress.  Appears in no distress.  Conversant with his daughter.  Does not appear dyspneic.  Well oxygenate--98% on room air.  HENT:  Head: Normocephalic.  Eyes: Pupils are equal, round, and reactive to light. Conjunctivae are normal. No scleral icterus.  Neck: Normal range of motion. Neck supple. No thyromegaly present.  Cardiovascular: Normal rate and regular rhythm. Exam reveals no gallop and no friction rub.  No murmur heard. Pulmonary/Chest: Effort normal and breath sounds normal. No respiratory distress. He has no wheezes. He has no rales.  Clear bilateral breath sounds  Abdominal: Soft. Bowel sounds are normal. He exhibits no distension. There is  no tenderness. There is no rebound.  Musculoskeletal: Normal range of motion.  Neurological: He is alert and oriented to person, place, and time.  Skin: Skin is warm and dry. No rash noted.  Psychiatric: He has a normal mood and affect. His behavior is normal.     ED Treatments / Results  Labs (all labs ordered are listed, but only abnormal results are displayed) Labs Reviewed  BASIC METABOLIC PANEL - Abnormal; Notable for the following components:      Result Value   Glucose, Bld 168 (*)    Creatinine, Ser 1.30 (*)    GFR calc non Af Amer 51 (*)    GFR calc Af Amer 59 (*)    All other components within normal limits  CBC  I-STAT TROPONIN, ED    EKG EKG Interpretation  Date/Time:  Friday May 11 2017 13:16:48 EDT Ventricular Rate:  63 PR Interval:    QRS Duration: 93 QT Interval:  398 QTC Calculation: 408 R Axis:   50 Text Interpretation:  Sinus  rhythm normal. no STEMI. no sig chznge from previous Confirmed by Charlesetta Shanks [phone]) on 05/11/2017 2:38:38 PM   Radiology Dg Chest 2 View  Result Date: 05/11/2017 CLINICAL DATA:  Chest pain with shortness of breath today. EXAM: CHEST - 2 VIEW COMPARISON:  01/03/2016 and 01/07/2014 radiographs. FINDINGS: The heart size is normal and stable for AP technique. The lungs are clear. There is no pleural effusion or pneumothorax. No acute osseous findings. Telemetry leads overlie the chest. IMPRESSION: Stable chest.  No active cardiopulmonary process. Electronically Signed   By: Richardean Sale M.D.   On: 05/11/2017 13:57    Procedures Procedures (including critical care time)  Medications Ordered in ED Medications  nitroGLYCERIN 50 mg in dextrose 5 % 250 mL (0.2 mg/mL) infusion (10 mcg/min Intravenous Rate/Dose Change 05/11/17 1503)     Initial Impression / Assessment and Plan / ED Course  I have reviewed the triage vital signs and the nursing notes.  Pertinent labs & imaging results that were available during my care of the  patient were reviewed by me and considered in my medical decision making (see chart for details).    EKG Interpretation  Date/Time:  Friday May 11 2017 13:16:48 EDT Ventricular Rate:  63 PR Interval:    QRS Duration: 93 QT Interval:  398 QTC Calculation: 408 R Axis:   50 Text Interpretation:  Sinus rhythm normal. no STEMI. no sig chznge from previous Confirmed by Charlesetta Shanks [phone]) on 05/11/2017 2:38:38 PM  Pain has improved with initiation of nitroglycerin drip.  Of asked that this be titrated up.  He rates his pain currently at a 3-4.  Awaiting availability of Cath Lab.  Has had aspirin.  Has been seen here by cardiologist and cardiology staff and additional orders written.  CRITICAL CARE Performed by: Lolita Patella   Total critical care time: 50minutes  Critical care time was exclusive of separately billable procedures and treating other patients.  Critical care was necessary to treat or prevent imminent or life-threatening deterioration.  Critical care was time spent personally by me on the following activities: development of treatment plan with patient and/or surrogate as well as nursing, discussions with consultants, evaluation of patient's response to treatment, examination of patient, obtaining history from patient or surrogate, ordering and performing treatments and interventions, ordering and review of laboratory studies, ordering and review of radiographic studies, pulse oximetry and re-evaluation of patient's condition.    Final Clinical Impressions(s) / ED Diagnoses   Final diagnoses:  Unstable angina [facility])    ED Discharge Orders    None       Tanna Furry, MD 05/11/17 1510

## 2017-05-11 NOTE — Progress Notes (Signed)
1915 Bedside shift report. Pt resting in bed, zephyr compression band intact to right radial. No complications noted. Family at bedside. Fall precautions in place, [facility].  1940 Pt medicated for headache.  Pt does not speak [place]. Dgts x3 at bedside, translating for pt. Zephyr band deflated per protocol. Pt assisted to bathroom. Pt assessed, see flow sheet. Fall precautions in place, [facility].

## 2017-05-11 NOTE — ED Notes (Signed)
Cath lab 7

## 2017-05-11 NOTE — ED Triage Notes (Signed)
Per Family (who is translating for Pt): pt went to his Cardiologist today. Pt c/o Cp since 1100 today, associated with dizziness, light headedness. Pt c/o of central pressure. Hx of MI. A&Ox4.  Pt and family made aware of translator services but would rather themselves interpret for Pt.

## 2017-05-11 NOTE — H&P (Addendum)
[name] is an 77 y.o. male.   Chief Complaint: Chest pain HPI:   77 year old Asian male with coronary artery disease status post PTCA to diagonal 2 for acute MI in 2005, medically managed diffuse CAD seen on cath 2012, hypertension, hyperlipidemia, last seen in 05/2016.  He is here for acute visit for chest pain.  Patient was seen in our office for urgent visit today with his daughter 54 and grandson Rodman Key.  Patient had sudden onset retrosternal chest pain starting at 11 AM this morning, associated with shortness of breath, dizziness, and blurry vision.  Pain improved with 2 sublingual nitroglycerin 5 minutes apart, but returned soon thereafter.  Patient still had chest pain while in the office which partially improved with sublingual nitroglycerin.  Agent in the office was unchanged compared to his previous EKG did not show any acute ischemic changes.   Past Medical History:  Diagnosis Date  . Coronary artery disease    balloon angioplasty of diagonal branch of LAD in 2005  . Dyslipidemia   . GERD (gastroesophageal reflux disease)   . History of nuclear stress test 10/21/2009   dipyridamole; mod perfusion defect due to infarct/scar with mild periinfarct ischemia in mid ant/apical ant/mid anterolateral/apical lateral; no significant ischemia   . Hx of pulmonary embolus   . Hypertension   . Ischemic cardiomyopathy    h/o  . MI (myocardial infarction) 07/09/2003   anterior   . OA (osteoarthritis)   . 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 CARDIAC CATHETERIZATION  07/08/2003   2.25x20 Maverick balloon angioplasty of diagonal 2 branch of LAD (Dr. Lowella Dell)  . CARDIAC CATHETERIZATION  03/25/2004   diagonal vessel patent w/40% restenosis, 50-60% mid LAD stenosis (Dr. Jackie Plum)  . CARDIAC CATHETERIZATION  07/23/2006   no significant change from 2006 cath (Dr. Domenic Moras)   . CARDIAC SURGERY    . CORONARY ARTERY BYPASS GRAFT    . TRANSTHORACIC  ECHOCARDIOGRAM  05/24/2011   EF 55-60%, mild LVH, grade 1 diastolic dysfunction; AV sclerosis; normal CVP    No family history on file. Social History:  reports that he quit smoking about 22 years ago. He has never used smokeless tobacco. He reports that he does not drink alcohol or use drugs.  Allergies:  Allergies  Allergen Reactions  . Ace Inhibitors Cough  . Lipitor [Atorvastatin Calcium] Other (See Comments)    myalgias  . Simvastatin Other (See Comments)    Myalgias   . Advicor [Niacin-Lovastatin Er] Rash    No medications prior to admission.     Review of Systems  Constitutional: Negative.   HENT: Negative.   Eyes: Positive for blurred vision.  Respiratory: Positive for shortness of breath. Negative for cough.   Cardiovascular: Positive for chest pain. Negative for leg swelling and PND.  Gastrointestinal: Negative for abdominal pain, nausea and vomiting.  Genitourinary: Negative.   Musculoskeletal: Negative.   Skin: Negative for rash.  Neurological: Positive for headaches. Negative for loss of consciousness.  Endo/Heme/Allergies: Does not bruise/bleed easily.  Psychiatric/Behavioral:       Not performed due to clinical acuity  All other systems reviewed and are negative.  Vitals in the office at 12:50- PM BP 148/84 mmHg. HR 72/min regular. O2 sats 98% on RA RR 12/min There were no vitals taken for this visit. Physical Exam  Nursing note and vitals reviewed. Constitutional: He is oriented to person, place, and time. He appears  well-developed and well-nourished.  HENT:  Head: Normocephalic and atraumatic.  Eyes: Conjunctivae are normal.  Neck: Normal range of motion. Neck supple. No JVD present.  Cardiovascular: Normal rate, regular rhythm and normal heart sounds.  No murmur heard. Respiratory: Effort normal and breath sounds normal.  GI: Soft. Bowel sounds are normal. There is no tenderness. There is no rebound.  Musculoskeletal: He exhibits no edema.   Lymphadenopathy:    He has no cervical adenopathy.  Neurological: He is alert and oriented to person, place, and time. No cranial nerve deficit.     Assessment/Plan 77 y/o [place] male with hypertension, hyperlipidemia, known CAD with prior PTCA, now admitted with chest pain, blurry vision.  Chest pain: Likely unstable angina Blurry vision: No acute findings s/o CVA.  Hypertnesion Hyperlipidemia.  Admit to stepdown unit Urgent cath IV NTG Patient received total of 3 SL NTG and 324 ASA Continue baseline medical therapy.   Consent obtained through daughter May who translated for the patient.  Schedule for cardiac catheterization, and possible angioplasty. We discussed regarding risks, benefits, alternatives to this including stress testing, CTA and continued medical therapy. Patient wants to proceed. Understands <1-2% risk of death, stroke, MI, urgent CABG, bleeding, infection, renal failure but not limited to these.    Bryan Mormon, MD  05/11/2017, 12:59 PM  [name] Esther Hardy, MD [facility] [place] Pager: [phone] Office: [phone] If no answer Cell [phone]

## 2017-05-11 NOTE — Interval H&P Note (Signed)
History and Physical Interval Note:  05/11/2017 2:37 PM  [name]  has presented today for surgery, with the diagnosis of cp  The various methods of treatment have been discussed with the patient and family. After consideration of risks, benefits and other options for treatment, the patient has consented to  Procedure(s): LEFT HEART CATH AND CORONARY ANGIOGRAPHY (N/A) as a surgical intervention .  The patient's history has been reviewed, patient examined, no change in status, stable for surgery.  I have reviewed the patient's chart and labs.  Questions were answered to the patient's satisfaction.    2016 Appropriate Use Criteria for Coronary Revascularization in Patients With Acute Coronary Syndrome NSTEMI/UA Intermediate Risk (TIMI Score 3-4)  NSTEMI/Unstable angina, stabilized patient at Intermediate Risk (TIMI Score 3-4) Link Here: http://dodson-rose.net/ Indication:  Revascularization by PCI or CABG of 1 or more arteries in a patient with NSTEMI or unstable angina with Stabilization after presentation Intermediate risk for clinical events  A (7) Indication: 16; Score 7      [place]

## 2017-05-11 NOTE — ED Notes (Signed)
Pt given 324 ASP at 1200, and 6 nitro with some relief but CP has returned.

## 2017-05-11 NOTE — H&P (View-Only) (Signed)
[name] is an 77 y.o. male.   Chief Complaint: Chest pain HPI:   77 year old Asian male with coronary artery disease status post PTCA to diagonal 2 for acute MI in 2005, medically managed diffuse CAD seen on cath 2012, hypertension, hyperlipidemia, last seen in 05/2016.  He is here for acute visit for chest pain.  Patient was seen in our office for urgent visit today with his daughter 97 and grandson Rodman Key.  Patient had sudden onset retrosternal chest pain starting at 11 AM this morning, associated with shortness of breath, dizziness, and blurry vision.  Pain improved with 2 sublingual nitroglycerin 5 minutes apart, but returned soon thereafter.  Patient still had chest pain while in the office which partially improved with sublingual nitroglycerin.  Agent in the office was unchanged compared to his previous EKG did not show any acute ischemic changes.   Past Medical History:  Diagnosis Date  . Coronary artery disease    balloon angioplasty of diagonal branch of LAD in 2005  . Dyslipidemia   . GERD (gastroesophageal reflux disease)   . History of nuclear stress test 10/21/2009   dipyridamole; mod perfusion defect due to infarct/scar with mild periinfarct ischemia in mid ant/apical ant/mid anterolateral/apical lateral; no significant ischemia   . Hx of pulmonary embolus   . Hypertension   . Ischemic cardiomyopathy    h/o  . MI (myocardial infarction) 07/09/2003   anterior   . OA (osteoarthritis)   . 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 CARDIAC CATHETERIZATION  07/08/2003   2.25x20 Maverick balloon angioplasty of diagonal 2 branch of LAD (Dr. Lowella Dell)  . CARDIAC CATHETERIZATION  03/25/2004   diagonal vessel patent w/40% restenosis, 50-60% mid LAD stenosis (Dr. Jackie Plum)  . CARDIAC CATHETERIZATION  07/23/2006   no significant change from 2006 cath (Dr. Domenic Moras)   . CARDIAC SURGERY    . CORONARY ARTERY BYPASS GRAFT    . TRANSTHORACIC  ECHOCARDIOGRAM  05/24/2011   EF 55-60%, mild LVH, grade 1 diastolic dysfunction; AV sclerosis; normal CVP    No family history on file. Social History:  reports that he quit smoking about 22 years ago. He has never used smokeless tobacco. He reports that he does not drink alcohol or use drugs.  Allergies:  Allergies  Allergen Reactions  . Ace Inhibitors Cough  . Lipitor [Atorvastatin Calcium] Other (See Comments)    myalgias  . Simvastatin Other (See Comments)    Myalgias   . Advicor [Niacin-Lovastatin Er] Rash    No medications prior to admission.     Review of Systems  Constitutional: Negative.   HENT: Negative.   Eyes: Positive for blurred vision.  Respiratory: Positive for shortness of breath. Negative for cough.   Cardiovascular: Positive for chest pain. Negative for leg swelling and PND.  Gastrointestinal: Negative for abdominal pain, nausea and vomiting.  Genitourinary: Negative.   Musculoskeletal: Negative.   Skin: Negative for rash.  Neurological: Positive for headaches. Negative for loss of consciousness.  Endo/Heme/Allergies: Does not bruise/bleed easily.  Psychiatric/Behavioral:       Not performed due to clinical acuity  All other systems reviewed and are negative.  Vitals in the office at 12:50- PM BP 148/84 mmHg. HR 72/min regular. O2 sats 98% on RA RR 12/min There were no vitals taken for this visit. Physical Exam  Nursing note and vitals reviewed. Constitutional: He is oriented to person, place, and time. He appears  well-developed and well-nourished.  HENT:  Head: Normocephalic and atraumatic.  Eyes: Conjunctivae are normal.  Neck: Normal range of motion. Neck supple. No JVD present.  Cardiovascular: Normal rate, regular rhythm and normal heart sounds.  No murmur heard. Respiratory: Effort normal and breath sounds normal.  GI: Soft. Bowel sounds are normal. There is no tenderness. There is no rebound.  Musculoskeletal: He exhibits no edema.   Lymphadenopathy:    He has no cervical adenopathy.  Neurological: He is alert and oriented to person, place, and time. No cranial nerve deficit.     Assessment/Plan 78 y/o [place] male with hypertension, hyperlipidemia, known CAD with prior PTCA, now admitted with chest pain, blurry vision.  Chest pain: Likely unstable angina Blurry vision: No acute findings s/o CVA.  Hypertnesion Hyperlipidemia.  Admit to stepdown unit Urgent cath IV NTG Patient received total of 3 SL NTG and 324 ASA Continue baseline medical therapy.   Consent obtained through daughter May who translated for the patient.  Schedule for cardiac catheterization, and possible angioplasty. We discussed regarding risks, benefits, alternatives to this including stress testing, CTA and continued medical therapy. Patient wants to proceed. Understands <1-2% risk of death, stroke, MI, urgent CABG, bleeding, infection, renal failure but not limited to these.    Nigel Mormon, MD  05/11/2017, 12:59 PM  Marisha Renier Esther Hardy, MD [facility] [place] Pager: [phone] Office: [phone] If no answer Cell [phone]

## 2017-05-12 DIAGNOSIS — I25110 Atherosclerotic heart disease of native coronary artery with unstable angina pectoris: Secondary | ICD-10-CM | POA: Diagnosis not present

## 2017-05-12 DIAGNOSIS — Z955 Presence of coronary angioplasty implant and graft: Secondary | ICD-10-CM | POA: Diagnosis not present

## 2017-05-12 DIAGNOSIS — I10 Essential (primary) hypertension: Secondary | ICD-10-CM | POA: Diagnosis not present

## 2017-05-12 DIAGNOSIS — I252 Old myocardial infarction: Secondary | ICD-10-CM | POA: Diagnosis not present

## 2017-05-12 MED ORDER — ASPIRIN 81 MG PO CHEW: 81.0000 mg | CHEWABLE_TABLET | Freq: Every day | ORAL | 6 refills | Status: AC

## 2017-05-12 NOTE — Progress Notes (Signed)
Went over all discharge instructions with May, daughter, she served as the Veterinary surgeon.  She is also the one that fixes all of his medicines.

## 2017-05-12 NOTE — Discharge Summary (Signed)
Physician Discharge Summary  Patient ID: [name] MRN: [id number] DOB/AGE: November 18, 1940 77 y.o.  Admit date: 05/11/2017 Discharge date: 05/12/2017  Admission Diagnoses:  Discharge Diagnoses:  Active Problems:   Chest pain   Discharged Condition: good  Hospital Course:   Patient was admitted from clinic due to nitrate responsive chest pain. Moderate multivessel nonobstructive disease with separate LAD and LCx ostia seen on cath, as demonstrated by negative FFR. LVEF intact. Trop negative. Nitrate responsive chest pain may have been either coronary or esophageal spasm. Recommend continuing aggressive medical managment for CAD, as well as PPI for GERD.  Consults: None  Significant Diagnostic Studies: Labs Results for Bryan, Gay (MRN [id number]) as of 05/12/2017 09:44  Ref. Range 05/11/2017 13:36  WBC Latest Ref Range: 4.0 - 10.5 K/uL 5.3  RBC Latest Ref Range: 4.22 - 5.81 MIL/uL 5.57  Hemoglobin Latest Ref Range: 13.0 - 17.0 g/dL 15.0  HCT Latest Ref Range: 39.0 - 52.0 % 44.3  MCV Latest Ref Range: 78.0 - 100.0 fL 79.5  MCH Latest Ref Range: 26.0 - 34.0 pg 26.9  MCHC Latest Ref Range: 30.0 - 36.0 g/dL 33.9  RDW Latest Ref Range: 11.5 - 15.5 % 14.1  Platelets Latest Ref Range: 150 - 400 K/uL 177   Results for Bryan, Gay (MRN [id number]) as of 05/12/2017 09:44  Ref. Range 05/11/2017 13:36 05/11/2017 13:57 05/11/2017 22:02  BASIC METABOLIC PANEL Unknown Rpt (A)    Sodium Latest Ref Range: 135 - 145 mmol/L 139    Potassium Latest Ref Range: 3.5 - 5.1 mmol/L 3.8    Chloride Latest Ref Range: 101 - 111 mmol/L 103    CO2 Latest Ref Range: 22 - 32 mmol/L 25    Glucose Latest Ref Range: 65 - 99 mg/dL 168 (H)    BUN Latest Ref Range: 6 - 20 mg/dL 12    Creatinine Latest Ref Range: 0.61 - 1.24 mg/dL 1.30 (H)    Calcium Latest Ref Range: 8.9 - 10.3 mg/dL 9.6    Anion gap Latest Ref Range: 5 - 15  11    GFR, Est Non African American Latest Ref Range: >60 mL/min 51 (L)    GFR, Est African  American Latest Ref Range: >60 mL/min 59 (L)    Troponin I Latest Ref Range: <0.03 ng/mL   <0.03  Troponin i, poc Latest Ref Range: 0.00 - 0.08 ng/mL  0.00    Treatments:  Moderate nonobstructive coronary artery disease No true left main Ostial 50%, mid 30-40% stenoses. FFR 0.83 Medium sized diagonal with diffuse 60% disease Ostial LCx 40% with resting Pd/Pa 0.98. Mid 20-30% disease Prox RCA 40% stenosis Normal LVEDP, LVEF 55-65% Recommend aggressive medical management Chest pain episode was either due to coronary spasm, or noncardiac etiology such as GERD or esophageal spasm. This may explain improvement with SL NTG.    Discharge Exam: Blood pressure (!) 159/78, pulse (!) 59, temperature 98 F (36.7 C), temperature source Oral, resp. rate 17, height 5\' 4"  (1.626 m), weight 76 kg (167 lb 8.8 oz), SpO2 95 %. Physical Exam  Nursing note and vitals reviewed. Constitutional: He is oriented to person, place, and time. He appears well-developed and well-nourished.  HENT:  Head: Normocephalic and atraumatic.  Eyes: Conjunctivae are normal.  Neck: Normal range of motion. Neck supple. No JVD present.  Cardiovascular: Normal rate, regular rhythm and normal heart sounds.  No murmur heard. Respiratory: Effort normal and breath sounds normal.  GI: Soft. Bowel sounds are normal. There is no tenderness.  There is no rebound.  Musculoskeletal: He exhibits no edema.  Lymphadenopathy:    He has no cervical adenopathy.  Neurological: He is alert and oriented to person, place, and time. No cranial nerve deficit.       Disposition: Discharge disposition: 01-Home or Self Care       Discharge Instructions    Diet - low sodium heart healthy   Complete by:  As directed    Increase activity slowly   Complete by:  As directed      Allergies as of 05/12/2017      Reactions   Ace Inhibitors Cough   Lipitor [atorvastatin Calcium] Other (See Comments)   Muscle aches   Simvastatin Other (See  Comments)   Muscle aches   Advicor [niacin-lovastatin Er] Rash      Medication List    STOP taking these medications   aspirin 325 MG tablet Replaced by:  aspirin 81 MG chewable tablet     TAKE these medications   amLODipine 5 MG tablet Commonly known as:  NORVASC Take 5 mg by mouth daily.   aspirin 81 MG chewable tablet Chew 1 tablet (81 mg total) by mouth daily. Replaces:  aspirin 325 MG tablet   CENTRUM SILVER 50+MEN Tabs Take 1 tablet by mouth daily.   colchicine 0.6 MG tablet Take 0.6 mg by mouth See admin instructions. Take 0.6 mg by mouth every eight hours until acute gout flare subsides   glucosamine-chondroitin 500-400 MG tablet Take 2 tablets by mouth daily.   isosorbide mononitrate 60 MG 24 hr tablet Commonly known as:  IMDUR Take 60 mg by mouth daily.   losartan 50 MG tablet Commonly known as:  COZAAR Take 50 mg by mouth daily.   metoprolol tartrate 50 MG tablet Commonly known as:  LOPRESSOR Take 25 mg by mouth 2 (two) times daily.   nitroGLYCERIN 0.4 MG SL tablet Commonly known as:  NITROSTAT Place 0.4 mg under the tongue every 5 (five) minutes as needed for chest pain.   ondansetron 8 MG tablet Commonly known as:  ZOFRAN Take 8 mg by mouth every 8 (eight) hours as needed for nausea or vomiting.   pantoprazole 40 MG tablet Commonly known as:  PROTONIX Take 40 mg by mouth daily.   rosuvastatin 40 MG tablet Commonly known as:  CRESTOR Take 20 mg by mouth at bedtime.        SignedNigel Mormon 05/12/2017, 9:42 AM   Nigel Mormon, MD [facility] [place] Pager: [phone] Office: [phone] If no answer Cell [phone]

## 2017-05-14 ENCOUNTER — Encounter (HOSPITAL_COMMUNITY): Payer: Self-pay | Admitting: Cardiology

## 2017-05-14 LAB — POCT ACTIVATED CLOTTING TIME: ACTIVATED CLOTTING TIME: 235 s

## 2017-05-14 MED FILL — Heparin Sodium (Porcine) 2 Unit/ML in Sodium Chloride 0.9%: INTRAMUSCULAR | Qty: 1000 | Status: AC

## 2017-05-14 MED FILL — Lidocaine HCl Local Inj 1%: INTRAMUSCULAR | Qty: 20 | Status: AC

## 2018-03-04 ENCOUNTER — Other Ambulatory Visit: Payer: Self-pay | Admitting: Physician Assistant

## 2018-03-04 DIAGNOSIS — R140 Abdominal distension (gaseous): Secondary | ICD-10-CM

## 2018-03-11 ENCOUNTER — Ambulatory Visit
Admission: RE | Admit: 2018-03-11 | Discharge: 2018-03-11 | Disposition: A | Discharge status: Home / Self Care | Payer: Medicare Other | Source: Ambulatory Visit | Attending: Physician Assistant | Admitting: Physician Assistant

## 2018-03-11 DIAGNOSIS — R140 Abdominal distension (gaseous): Secondary | ICD-10-CM

## 2018-03-31 ENCOUNTER — Encounter: Payer: Self-pay | Admitting: Cardiology

## 2018-03-31 DIAGNOSIS — E782 Mixed hyperlipidemia: Secondary | ICD-10-CM

## 2018-03-31 DIAGNOSIS — K219 Gastro-esophageal reflux disease without esophagitis: Secondary | ICD-10-CM | POA: Insufficient documentation

## 2018-03-31 DIAGNOSIS — R739 Hyperglycemia, unspecified: Secondary | ICD-10-CM

## 2018-03-31 HISTORY — DX: Hyperglycemia, unspecified: R73.9

## 2018-03-31 HISTORY — DX: Mixed hyperlipidemia: E78.2

## 2018-03-31 NOTE — Progress Notes (Deleted)
Subjective:  Primary Physician:  [name], MD  Patient ID: [name], male    DOB: 1940-12-03, 78 y.o.   MRN: [id number]  No chief complaint on file.   HPI: [name]  is a [place] 78 y.o. male  with coronary artery disease status post PTCA to diagonal 2 for acute MI in 2005, hypertension, hyperlipidemia, admitted to hospital from the office on 05/11/2017 with complaints of nitrate responsive chest pain. Cath showed mild proximal RCA disease, moderate disease in true LAD and circumflex with negative FFR. It was felt the chest pain was likely due to esophageal spasm or GERD. Due to leg edema, amlodipine dose reduced.  Now comes in for f/u of CAD and hypertension and last seen 6 weeks ago. He was switched to labetalol on his last OV.   Past Medical History:  Diagnosis Date  . Coronary artery disease    balloon angioplasty of diagonal branch of LAD in 2005  . Dyslipidemia   . GERD (gastroesophageal reflux disease)   . GERD without esophagitis 03/31/2018  . History of nuclear stress test 10/21/2009   dipyridamole; mod perfusion defect due to infarct/scar with mild periinfarct ischemia in mid ant/apical ant/mid anterolateral/apical lateral; no significant ischemia   . Hx of pulmonary embolus   . Hyperglycemia 03/31/2018  . Hypertension   . Ischemic cardiomyopathy    h/o  . MI (myocardial infarction) ([place]) 07/09/2003   anterior   . Mixed hyperlipidemia 03/31/2018  . OA (osteoarthritis)   . 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 Unstable angina ([place]) 05/11/2017    Past Surgical History:  Procedure Laterality Date  . CARDIAC CATHETERIZATION  07/08/2003   2.25x20 [place] balloon angioplasty of diagonal 2 branch of LAD (Dr. Lowella Dell)  . CARDIAC CATHETERIZATION  03/25/2004   diagonal vessel patent w/40% restenosis, 50-60% mid LAD stenosis (Dr. Jackie Plum)  . CARDIAC CATHETERIZATION  07/23/2006   no significant change from 2006 cath (Dr. Domenic Moras)   . CARDIAC SURGERY    . CORONARY ARTERY  BYPASS GRAFT    . INTRAVASCULAR PRESSURE WIRE/FFR STUDY N/A 05/11/2017   Procedure: INTRAVASCULAR PRESSURE WIRE/FFR STUDY;  Surgeon: Nigel Mormon, MD;  Location: [place] [facility];  Service: Cardiovascular;  Laterality: N/A;  . LEFT HEART CATH AND CORONARY ANGIOGRAPHY N/A 05/11/2017   Procedure: LEFT HEART CATH AND CORONARY ANGIOGRAPHY;  Surgeon: Nigel Mormon, MD;  Location: [place] [facility];  Service: Cardiovascular;  Laterality: N/A;  . TRANSTHORACIC ECHOCARDIOGRAM  05/24/2011   EF 55-60%, mild LVH, grade 1 diastolic dysfunction; AV sclerosis; normal CVP    Social History   Socioeconomic History  . Marital status: Married    Spouse name: Not on file  . Number of children: 6  . Years of education: Not on file  . Highest education level: Not on file  Occupational History    Employer: [employer]  . Financial resource strain: Not on file  . Food insecurity:    Worry: Not on file    Inability: Not on file  . Transportation needs:    Medical: Not on file    Non-medical: Not on file  Tobacco Use  . Smoking status: Former Smoker    Last attempt to quit: 12/28/1994    Years since quitting: 23.2  . Smokeless tobacco: Never Used  Substance and Sexual Activity  . Alcohol use: No  . Drug use: No  . Sexual activity: Not on file  Lifestyle  .  Physical activity:    Days per week: Not on file    Minutes per session: Not on file  . Stress: Not on file  Relationships  . Social connections:    Talks on phone: Not on file    Gets together: Not on file    Attends religious service: Not on file    Active member of club or organization: Not on file    Attends meetings of clubs or organizations: Not on file    Relationship status: Not on file  . Intimate partner violence:    Fear of current or ex partner: Not on file    Emotionally abused: Not on file    Physically abused: Not on file    Forced sexual activity: Not on file  Other Topics Concern  . Not on  file  Social History Narrative  . Not on file    Current Outpatient Medications on File Prior to Visit  Medication Sig Dispense Refill  . amLODipine (NORVASC) 5 MG tablet Take 5 mg by mouth daily.  0  . aspirin 81 MG chewable tablet Chew 1 tablet (81 mg total) by mouth daily. 60 tablet 6  . colchicine 0.6 MG tablet Take 0.6 mg by mouth See admin instructions. Take 0.6 mg by mouth every eight hours until acute gout flare subsides  0  . glucosamine-chondroitin 500-400 MG tablet Take 2 tablets by mouth daily.     . isosorbide mononitrate (IMDUR) 60 MG 24 hr tablet Take 60 mg by mouth daily.    Marland Kitchen losartan (COZAAR) 50 MG tablet Take 50 mg by mouth daily.    . metoprolol tartrate (LOPRESSOR) 50 MG tablet Take 25 mg by mouth 2 (two) times daily.  3  . Multiple Vitamins-Minerals (CENTRUM SILVER 50+MEN) TABS Take 1 tablet by mouth daily.    . nitroGLYCERIN (NITROSTAT) 0.4 MG SL tablet Place 0.4 mg under the tongue every 5 (five) minutes as needed for chest pain.     Marland Kitchen ondansetron (ZOFRAN) 8 MG tablet Take 8 mg by mouth every 8 (eight) hours as needed for nausea or vomiting.     . pantoprazole (PROTONIX) 40 MG tablet Take 40 mg by mouth daily.    . rosuvastatin (CRESTOR) 40 MG tablet Take 20 mg by mouth at bedtime.      No current facility-administered medications on file prior to visit.      Review of Systems  Constitutional: Negative for malaise/fatigue and weight loss.  Respiratory: Negative for cough, hemoptysis and shortness of breath.   Cardiovascular: Negative for chest pain, palpitations, claudication and leg swelling.  Gastrointestinal: Negative for abdominal pain, blood in stool, constipation, heartburn and vomiting.  Genitourinary: Negative for dysuria.  Musculoskeletal: Negative for joint pain and myalgias.  Neurological: Negative for dizziness, focal weakness and headaches.  Endo/Heme/Allergies: Does not bruise/bleed easily.  Psychiatric/Behavioral: Negative for depression. The  patient is not nervous/anxious.   All other systems reviewed and are negative.      Objective:  There were no vitals taken for this visit. There is no height or weight on file to calculate BMI.   Physical Exam  Constitutional: He appears well-developed and well-nourished. No distress.  HENT:  Head: Atraumatic.  Eyes: Conjunctivae are normal.  Neck: Neck supple. No JVD present. No thyromegaly present.  Cardiovascular: Normal rate, regular rhythm, normal heart sounds and intact distal pulses. Exam reveals no gallop.  No murmur heard. Pulmonary/Chest: Effort normal and breath sounds normal.  Abdominal: Soft. Bowel sounds are normal.  Musculoskeletal: Normal range of motion.        General: No edema.  Neurological: He is alert.  Skin: Skin is warm and dry.  Psychiatric: He has a normal mood and affect.   CARDIAC STUDIES:   Coronary angiogram  05/11/2017: Moderate nonobstructive coronary artery disease. No true left main. Ostial 50%, mid 30-40% stenoses. FFR 0.83. Medium sized diagonal with diffuse 60% disease Ostial LCx 40% with resting Pd/Pa 0.98. Mid 20-30% disease Prox RCA 40% stenosis. Normal LVEDP, LVEF 55-65%  Echocardiogram 01/25/2018: Left ventricle cavity is normal in size. Mild concentric hypertrophy of the left ventricle. Normal global wall motion. Doppler evidence of grade II (pseudonormal) diastolic dysfunction. Diastolic dysfunction findings suggests elevated LA/LV end diastolic pressure. Calculated EF 55%. Trace tricuspid regurgitation. Mild to moderate pulmonic regurgitation. Compared to 01/16/2013, no significant change. Diastolic dysfunction was grade I.   Assessment & Recommendations:   1. Coronary artery disease of native artery of native heart with stable angina pectoris ([place]) EKG 01/04/2018: Sinus bradycardia at rate of 56 bpm, normal axis, LVH with repolarization abnormality, cannot exclude lateral ischemia. Compared to EKG 05/11/2017, lateral T inversin  new.  2. Angina pectoris (HCC) ***  3. Mixed hyperlipidemia ***  4. Essential hypertension ***  5. GERD without esophagitis *** 6. Laboratory examination: 02/01/2018: Hemoglobin A1c 7.6%.  Creatinine 1.42, EGFR 48/58, potassium 4.3, CMP otherwise normal.  Cholesterol 165, triglycerides 144, HDL 47, LDL 89.  Recommendation: ***  Adrian Prows, MD, [facility] 03/31/2018, 9:50 AM [facility] [place] Pager: [phone] Office: [phone] If no answer Cell [phone]

## 2018-04-01 ENCOUNTER — Ambulatory Visit: Payer: Self-pay | Admitting: Cardiology

## 2018-04-17 NOTE — Progress Notes (Signed)
Subjective:  Primary Physician:  [name], MD  Patient ID: [name], male    DOB: 1940/12/05, 78 y.o.   MRN: [id number]  Chief Complaint  Patient presents with  . Coronary Artery Disease  . Hypertension  . Follow-up    6wk    HPI: [name]  is a [place] 78 y.o. male with coronary artery disease status post PTCA to diagonal 2 for acute MI in 2005, hypertension, hyperlipidemia, admitted to hospital from the office on 05/11/2017 with complaints of nitrate responsive chest pain. Cath showed mild proximal RCA disease, moderate disease in true LAD and circumflex with negative FFR. It was felt the chest pain was likely due to esophageal spasm or GERD.  Patient is here on a six-week office visit and follow-up of hypertension and coronary artery disease, he has not had any angina pectoris, recently diagnosed with uncontrolled diabetes and  Was started on diabetes medications in Dec 2019 and BS at home have been good since. Amlodipine 5 mg is now tolerated without edema. His blood pressure has now been well-controlled.  Past Medical History:  Diagnosis Date  . Coronary artery disease    balloon angioplasty of diagonal branch of LAD in 2005  . Dyslipidemia   . GERD (gastroesophageal reflux disease)   . GERD without esophagitis 03/31/2018  . History of nuclear stress test 10/21/2009   dipyridamole; mod perfusion defect due to infarct/scar with mild periinfarct ischemia in mid ant/apical ant/mid anterolateral/apical lateral; no significant ischemia   . Hx of pulmonary embolus   . Hyperglycemia 03/31/2018  . Hypertension   . Ischemic cardiomyopathy    h/o  . MI (myocardial infarction) ([place]) 07/09/2003   anterior   . Mixed hyperlipidemia 03/31/2018  . OA (osteoarthritis)   . 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 Unstable angina ([place]) 05/11/2017    Past Surgical History:  Procedure Laterality Date  . CARDIAC CATHETERIZATION  07/08/2003   2.25x20 Maverick balloon angioplasty of diagonal 2  branch of LAD (Dr. Lowella Dell)  . CARDIAC CATHETERIZATION  03/25/2004   diagonal vessel patent w/40% restenosis, 50-60% mid LAD stenosis (Dr. Jackie Plum)  . CARDIAC CATHETERIZATION  07/23/2006   no significant change from 2006 cath (Dr. Domenic Moras)   . CARDIAC SURGERY    . CORONARY ARTERY BYPASS GRAFT    . INTRAVASCULAR PRESSURE WIRE/FFR STUDY N/A 05/11/2017   Procedure: INTRAVASCULAR PRESSURE WIRE/FFR STUDY;  Surgeon: Nigel Mormon, MD;  Location: [facility];  Service: Cardiovascular;  Laterality: N/A;  . LEFT HEART CATH AND CORONARY ANGIOGRAPHY N/A 05/11/2017   Procedure: LEFT HEART CATH AND CORONARY ANGIOGRAPHY;  Surgeon: Nigel Mormon, MD;  Location: [facility];  Service: Cardiovascular;  Laterality: N/A;  . TRANSTHORACIC ECHOCARDIOGRAM  05/24/2011   EF 55-60%, mild LVH, grade 1 diastolic dysfunction; AV sclerosis; normal CVP    Social History   Socioeconomic History  . Marital status: Married    Spouse name: Not on file  . Number of children: 6  . Years of education: Not on file  . Highest education level: Not on file  Occupational History    Employer: [employer]  . Financial resource strain: Not on file  . Food insecurity:    Worry: Not on file    Inability: Not on file  . Transportation needs:    Medical: Not on file    Non-medical: Not on file  Tobacco Use  . Smoking status: Former Smoker  Last attempt to quit: 12/28/1994    Years since quitting: 23.3  . Smokeless tobacco: Never Used  Substance and Sexual Activity  . Alcohol use: No  . Drug use: No  . Sexual activity: Not on file  Lifestyle  . Physical activity:    Days per week: Not on file    Minutes per session: Not on file  . Stress: Not on file  Relationships  . Social connections:    Talks on phone: Not on file    Gets together: Not on file    Attends religious service: Not on file    Active member of club or organization: Not on file    Attends meetings of clubs  or organizations: Not on file    Relationship status: Not on file  . Intimate partner violence:    Fear of current or ex partner: Not on file    Emotionally abused: Not on file    Physically abused: Not on file    Forced sexual activity: Not on file  Other Topics Concern  . Not on file  Social History Narrative  . Not on file    Current Outpatient Medications on File Prior to Visit  Medication Sig Dispense Refill  . amLODipine (NORVASC) 5 MG tablet Take 5 mg by mouth daily.  0  . aspirin 81 MG chewable tablet Chew 1 tablet (81 mg total) by mouth daily. 60 tablet 6  . colchicine 0.6 MG tablet Take 0.6 mg by mouth continuous as needed. Take 0.6 mg by mouth every eight hours until acute gout flare subsides  0  . glimepiride (AMARYL) 1 MG tablet Take 1 tablet by mouth daily.    Marland Kitchen glucosamine-chondroitin 500-400 MG tablet Take 1 tablet by mouth daily.     . isosorbide mononitrate (IMDUR) 60 MG 24 hr tablet Take 60 mg by mouth daily.    Marland Kitchen losartan (COZAAR) 50 MG tablet Take 100 mg by mouth daily.     . Multiple Vitamins-Minerals (CENTRUM SILVER 50+MEN) TABS Take 1 tablet by mouth daily.    . nitroGLYCERIN (NITROSTAT) 0.4 MG SL tablet Place 0.4 mg under the tongue every 5 (five) minutes as needed for chest pain.     Marland Kitchen ondansetron (ZOFRAN) 8 MG tablet Take 8 mg by mouth every 8 (eight) hours as needed for nausea or vomiting.     . pantoprazole (PROTONIX) 40 MG tablet Take 40 mg by mouth daily.    . rosuvastatin (CRESTOR) 40 MG tablet Take 20 mg by mouth at bedtime.      No current facility-administered medications on file prior to visit.      Review of Systems  Constitutional: Negative for malaise/fatigue and weight loss.  Respiratory: Negative for cough, hemoptysis and shortness of breath.   Cardiovascular: Negative for chest pain, palpitations, claudication and leg swelling.  Gastrointestinal: Negative for abdominal pain, blood in stool, constipation, heartburn and vomiting.    Genitourinary: Negative for dysuria.  Musculoskeletal: Negative for joint pain and myalgias.  Neurological: Negative for dizziness, focal weakness and headaches.  Endo/Heme/Allergies: Does not bruise/bleed easily.  Psychiatric/Behavioral: Negative for depression. The patient is not nervous/anxious.   All other systems reviewed and are negative.      Objective:  Blood pressure 113/69, pulse (!) 56, height 5' 4"  (1.626 m), weight 168 lb (76.2 kg), SpO2 94 %. Body mass index is 28.84 kg/m.  Physical Exam  Constitutional: He appears well-developed and well-nourished. No distress.  HENT:  Head: Atraumatic.  Eyes: Conjunctivae  are normal.  Neck: Neck supple. No JVD present. No thyromegaly present.  Cardiovascular: Normal rate, regular rhythm, normal heart sounds and intact distal pulses. Exam reveals no gallop.  No murmur heard. Pulmonary/Chest: Effort normal and breath sounds normal.  Abdominal: Soft. Bowel sounds are normal.  Musculoskeletal: Normal range of motion.        General: No edema.  Neurological: He is alert.  Skin: Skin is warm and dry.  Psychiatric: He has a normal mood and affect.    CARDIAC STUDIES:   Echocardiogram 01/25/2018: Left ventricle cavity is normal in size. Mild concentric hypertrophy of the left ventricle. Normal global wall motion. Doppler evidence of grade II (pseudonormal) diastolic dysfunction. Diastolic dysfunction findings suggests elevated LA/LV end diastolic pressure. Calculated EF 55%. Trace tricuspid regurgitation. Mild to moderate pulmonic regurgitation. Compared to 01/16/2013, no significant change. Diastolic dysfunction was grade I.  EKG 01/04/2018: Sinus bradycardia at rate of 56 bpm, normal axis, LVH with repolarization abnormality, cannot exclude lateral ischemia. Compared to EKG 05/11/2017, lateral T inversin new.   Coronary angiogram 05/11/2017: Moderate nonobstructive coronary artery disease. No true left main. Ostial 50%, mid 30-40%  stenoses. FFR 0.83. Medium sized diagonal with diffuse 60% disease Ostial LCx 40% with resting Pd/Pa 0.98. Mid 20-30% disease Prox RCA 40% stenosis. Normal LVEDP, LVEF 55-65%.  Chest X-Ray 01/07/2014: Normal chest x-ray.  Mild cardiomegaly.  No active cardiopulmonary disease.  Nuclear Stress Test, exercise myoview 10/18/2012: 1. Resting EKG showed normal sinus rhythm, poor R wave progression. Stress EKG was negative for ischemia. Occasional PVC noted in recovery. Patient exercised on Bruce protocol for 7 minutes. The maximum work level achieved was 7.8 MET's. Hypertensive at rest and stress. The test was terminated due to achievement of the target heart rate and fatigue.  2. Perfusion images reveal scar in anterior, antero apical and apical lateral wall. There is no significant difference in comparison of stress and rest images on polar display. Dynamic gated images reveal anterior wall hypokinesis and endocardial thickening. Left ventricular ejection fraction was estimated to be 33%. This is a low risk study.  Assessment & Recommendations:   1. Coronary artery disease of native artery of native heart with stable angina pectoris ([place]) EKG 01/04/2018: Sinus bradycardia at rate of 56 bpm, normal axis, LVH with repolarization abnormality, cannot exclude lateral ischemia. Compared to EKG 05/11/2017, lateral T inversin new.  Coronary angiogram  05/11/2017: Moderate nonobstructive coronary artery disease. No true left main. Ostial 50%, mid 30-40% stenoses. FFR 0.83. Medium sized diagonal with diffuse 60% disease Ostial LCx 40% with resting Pd/Pa 0.98. Mid 20-30% disease Prox RCA 40% stenosis. Normal LVEDP, LVEF 55-65%  2. Essential hypertension  3. Mixed hyperlipidemia  4. Hyperglycemia  5. Laboratory examination 02/01/2018: Hemoglobin A1c 7.6%. Creatinine 1.42, EGFR 48/58, potassium 4.3, CMP otherwise normal. Cholesterol 165, triglycerides 144, HDL 47, LDL 89. 02/19/2014: HbA1c 5.9% HbA1c 09/13/2012:  6.3%.   Recommendation:  Mr. Oron Westrup is a [place] 78 year old male with coronary artery disease, hypertension, hyperlipidemia,   Patient is here on a six-week office visit and follow-up of hypertension and coronary artery disease His blood pressure is now much improved on labetalol.  Unfortunately he was also on metoprolol which was not discontinued by me on his last office visit.  I'll discontinue metoprolol which is fairly good doses at 50 mg b.i.d. due to underlying bradycardia and increase labetalol to 200 mg.   Patient's daughter is very comfortable in checking his vital signs and pulse rate, she'll contact us if  the heart rate is less than 55 bpm.  He has not any recurrence of angina pectoris, no congestive heart failure on exam, I'll see him back in 6 months.  Diabetes is now much improved with regard to blood sugar at home.  Adrian Prows, MD, [facility] 04/18/2018, 9:55 AM [facility] [place] Pager: [phone] Office: [phone] If no answer Cell [phone]

## 2018-04-18 ENCOUNTER — Ambulatory Visit: Payer: Medicare Other | Admitting: Cardiology

## 2018-04-18 ENCOUNTER — Encounter: Payer: Self-pay | Admitting: Cardiology

## 2018-04-18 VITALS — BP 113/69 | HR 56 | Ht 64.0 in | Wt 168.0 lb

## 2018-04-18 DIAGNOSIS — I10 Essential (primary) hypertension: Secondary | ICD-10-CM

## 2018-04-18 DIAGNOSIS — R739 Hyperglycemia, unspecified: Secondary | ICD-10-CM

## 2018-04-18 DIAGNOSIS — I25118 Atherosclerotic heart disease of native coronary artery with other forms of angina pectoris: Secondary | ICD-10-CM | POA: Diagnosis not present

## 2018-04-18 DIAGNOSIS — Z0189 Encounter for other specified special examinations: Secondary | ICD-10-CM

## 2018-04-18 DIAGNOSIS — E782 Mixed hyperlipidemia: Secondary | ICD-10-CM | POA: Diagnosis not present

## 2018-04-18 MED ORDER — LABETALOL HCL 200 MG PO TABS: 200.0000 mg | ORAL_TABLET | Freq: Two times a day (BID) | ORAL | 2 refills | Status: DC

## 2018-04-21 ENCOUNTER — Other Ambulatory Visit: Payer: Self-pay | Admitting: Cardiology

## 2018-05-30 ENCOUNTER — Other Ambulatory Visit: Payer: Self-pay | Admitting: Cardiology

## 2018-05-31 NOTE — Telephone Encounter (Signed)
Please fill

## 2018-07-13 ENCOUNTER — Other Ambulatory Visit: Payer: Self-pay

## 2018-07-13 ENCOUNTER — Emergency Department (HOSPITAL_COMMUNITY): Payer: Medicare Other

## 2018-07-13 ENCOUNTER — Encounter (HOSPITAL_COMMUNITY): Payer: Self-pay | Admitting: Emergency Medicine

## 2018-07-13 ENCOUNTER — Observation Stay (HOSPITAL_BASED_OUTPATIENT_CLINIC_OR_DEPARTMENT_OTHER)
Admission: EM | Admit: 2018-07-13 | Discharge: 2018-07-14 | Disposition: A | Discharge status: Home / Self Care | Payer: Medicare Other | Source: Home / Self Care | Attending: Emergency Medicine | Admitting: Emergency Medicine

## 2018-07-13 DIAGNOSIS — I209 Angina pectoris, unspecified: Secondary | ICD-10-CM

## 2018-07-13 DIAGNOSIS — I252 Old myocardial infarction: Secondary | ICD-10-CM | POA: Insufficient documentation

## 2018-07-13 DIAGNOSIS — E1122 Type 2 diabetes mellitus with diabetic chronic kidney disease: Secondary | ICD-10-CM

## 2018-07-13 DIAGNOSIS — U071 COVID-19: Principal | ICD-10-CM

## 2018-07-13 DIAGNOSIS — I25118 Atherosclerotic heart disease of native coronary artery with other forms of angina pectoris: Secondary | ICD-10-CM | POA: Diagnosis not present

## 2018-07-13 DIAGNOSIS — Z87891 Personal history of nicotine dependence: Secondary | ICD-10-CM | POA: Insufficient documentation

## 2018-07-13 DIAGNOSIS — K219 Gastro-esophageal reflux disease without esophagitis: Secondary | ICD-10-CM | POA: Insufficient documentation

## 2018-07-13 DIAGNOSIS — I129 Hypertensive chronic kidney disease with stage 1 through stage 4 chronic kidney disease, or unspecified chronic kidney disease: Secondary | ICD-10-CM | POA: Insufficient documentation

## 2018-07-13 DIAGNOSIS — E785 Hyperlipidemia, unspecified: Secondary | ICD-10-CM | POA: Insufficient documentation

## 2018-07-13 DIAGNOSIS — R55 Syncope and collapse: Secondary | ICD-10-CM | POA: Insufficient documentation

## 2018-07-13 DIAGNOSIS — N179 Acute kidney failure, unspecified: Secondary | ICD-10-CM | POA: Insufficient documentation

## 2018-07-13 DIAGNOSIS — Z79899 Other long term (current) drug therapy: Secondary | ICD-10-CM | POA: Insufficient documentation

## 2018-07-13 DIAGNOSIS — E119 Type 2 diabetes mellitus without complications: Secondary | ICD-10-CM

## 2018-07-13 DIAGNOSIS — N183 Chronic kidney disease, stage 3 unspecified: Secondary | ICD-10-CM

## 2018-07-13 DIAGNOSIS — Z7984 Long term (current) use of oral hypoglycemic drugs: Secondary | ICD-10-CM | POA: Insufficient documentation

## 2018-07-13 DIAGNOSIS — I25119 Atherosclerotic heart disease of native coronary artery with unspecified angina pectoris: Secondary | ICD-10-CM | POA: Insufficient documentation

## 2018-07-13 DIAGNOSIS — I10 Essential (primary) hypertension: Secondary | ICD-10-CM

## 2018-07-13 DIAGNOSIS — I2510 Atherosclerotic heart disease of native coronary artery without angina pectoris: Secondary | ICD-10-CM | POA: Diagnosis present

## 2018-07-13 DIAGNOSIS — Z7982 Long term (current) use of aspirin: Secondary | ICD-10-CM | POA: Insufficient documentation

## 2018-07-13 DIAGNOSIS — N289 Disorder of kidney and ureter, unspecified: Secondary | ICD-10-CM

## 2018-07-13 HISTORY — DX: Type 2 diabetes mellitus without complications: E11.9

## 2018-07-13 LAB — CBC WITH DIFFERENTIAL/PLATELET
Abs Immature Granulocytes: 0.01 10*3/uL (ref 0.00–0.07)
Basophils Absolute: 0 10*3/uL (ref 0.0–0.1)
Basophils Relative: 0 %
Eosinophils Absolute: 0 10*3/uL (ref 0.0–0.5)
Eosinophils Relative: 0 %
HCT: 36.4 % — ABNORMAL LOW (ref 39.0–52.0)
Hemoglobin: 11.8 g/dL — ABNORMAL LOW (ref 13.0–17.0)
Immature Granulocytes: 0 %
Lymphocytes Relative: 28 %
Lymphs Abs: 1.5 10*3/uL (ref 0.7–4.0)
MCH: 25.9 pg — ABNORMAL LOW (ref 26.0–34.0)
MCHC: 32.4 g/dL (ref 30.0–36.0)
MCV: 80 fL (ref 80.0–100.0)
Monocytes Absolute: 0.7 10*3/uL (ref 0.1–1.0)
Monocytes Relative: 12 %
Neutro Abs: 3.2 10*3/uL (ref 1.7–7.7)
Neutrophils Relative %: 60 %
Platelets: 138 10*3/uL — ABNORMAL LOW (ref 150–400)
RBC: 4.55 MIL/uL (ref 4.22–5.81)
RDW: 13.1 % (ref 11.5–15.5)
WBC: 5.3 10*3/uL (ref 4.0–10.5)
nRBC: 0 % (ref 0.0–0.2)

## 2018-07-13 LAB — COMPREHENSIVE METABOLIC PANEL
ALT: 33 U/L (ref 0–44)
AST: 32 U/L (ref 15–41)
Albumin: 3.6 g/dL (ref 3.5–5.0)
Alkaline Phosphatase: 94 U/L (ref 38–126)
Anion gap: 12 (ref 5–15)
BUN: 19 mg/dL (ref 8–23)
CO2: 20 mmol/L — ABNORMAL LOW (ref 22–32)
Calcium: 8.4 mg/dL — ABNORMAL LOW (ref 8.9–10.3)
Chloride: 103 mmol/L (ref 98–111)
Creatinine, Ser: 1.91 mg/dL — ABNORMAL HIGH (ref 0.61–1.24)
GFR calc Af Amer: 38 mL/min — ABNORMAL LOW (ref >60)
GFR calc non Af Amer: 33 mL/min — ABNORMAL LOW (ref >60)
Glucose, Bld: 208 mg/dL — ABNORMAL HIGH (ref 70–99)
Potassium: 4.3 mmol/L (ref 3.5–5.1)
Sodium: 135 mmol/L (ref 135–145)
Total Bilirubin: 0.6 mg/dL (ref 0.3–1.2)
Total Protein: 7.1 g/dL (ref 6.5–8.1)

## 2018-07-13 LAB — TROPONIN I
Troponin I: <0.03 ng/mL (ref <0.03)
Troponin I: <0.03 ng/mL (ref <0.03)

## 2018-07-13 LAB — C-REACTIVE PROTEIN: CRP: 2.1 mg/dL — ABNORMAL HIGH (ref <1.0)

## 2018-07-13 LAB — D-DIMER, QUANTITATIVE: D-Dimer, Quant: 1.06 ug/mL-FEU — ABNORMAL HIGH (ref 0.00–0.50)

## 2018-07-13 LAB — CBG MONITORING, ED: Glucose-Capillary: 164 mg/dL — ABNORMAL HIGH (ref 70–99)

## 2018-07-13 LAB — PROCALCITONIN: Procalcitonin: <0.1 ng/mL

## 2018-07-13 LAB — SARS CORONAVIRUS 2 BY RT PCR (HOSPITAL ORDER, PERFORMED IN ~~LOC~~ HOSPITAL LAB): SARS Coronavirus 2: POSITIVE — AB

## 2018-07-13 LAB — ABO/RH: ABO/RH(D): B POS

## 2018-07-13 MED ORDER — ASPIRIN 81 MG PO CHEW
81.0000 mg | CHEWABLE_TABLET | Freq: Every day | ORAL | Status: DC
  Administered 2018-07-14: 81 mg via ORAL
  Filled 2018-07-13: qty 1

## 2018-07-13 MED ORDER — LABETALOL HCL 200 MG PO TABS
200.0000 mg | ORAL_TABLET | Freq: Two times a day (BID) | ORAL | Status: DC
  Administered 2018-07-14: 200 mg via ORAL
  Filled 2018-07-13: qty 1

## 2018-07-13 MED ORDER — ADULT MULTIVITAMIN W/MINERALS CH
1.0000 | ORAL_TABLET | Freq: Every day | ORAL | Status: DC
  Administered 2018-07-14: 1 via ORAL
  Filled 2018-07-13: qty 1

## 2018-07-13 MED ORDER — AMLODIPINE BESYLATE 5 MG PO TABS
5.0000 mg | ORAL_TABLET | Freq: Every day | ORAL | Status: DC
  Administered 2018-07-14: 5 mg via ORAL
  Filled 2018-07-13: qty 1

## 2018-07-13 MED ORDER — ISOSORBIDE MONONITRATE ER 60 MG PO TB24
60.0000 mg | ORAL_TABLET | Freq: Every day | ORAL | Status: DC
  Administered 2018-07-14: 60 mg via ORAL
  Filled 2018-07-13: qty 1

## 2018-07-13 MED ORDER — ONDANSETRON HCL 4 MG PO TABS: 8.0000 mg | ORAL_TABLET | Freq: Three times a day (TID) | ORAL | Status: DC | PRN

## 2018-07-13 MED ORDER — PANTOPRAZOLE SODIUM 40 MG PO TBEC
40.0000 mg | DELAYED_RELEASE_TABLET | Freq: Every day | ORAL | Status: DC
  Administered 2018-07-14: 40 mg via ORAL
  Filled 2018-07-13: qty 1

## 2018-07-13 MED ORDER — INSULIN ASPART 100 UNIT/ML ~~LOC~~ SOLN
0.0000 [IU] | Freq: Three times a day (TID) | SUBCUTANEOUS | Status: DC
  Administered 2018-07-14: 1 [IU] via SUBCUTANEOUS

## 2018-07-13 MED ORDER — CENTRUM SILVER 50+MEN PO TABS: 1.0000 | ORAL_TABLET | Freq: Every day | ORAL | Status: DC

## 2018-07-13 MED ORDER — ALBUTEROL SULFATE HFA 108 (90 BASE) MCG/ACT IN AERS
2.0000 | INHALATION_SPRAY | Freq: Four times a day (QID) | RESPIRATORY_TRACT | Status: DC | PRN
  Filled 2018-07-13: qty 6.7

## 2018-07-13 MED ORDER — ROSUVASTATIN CALCIUM 20 MG PO TABS
20.0000 mg | ORAL_TABLET | Freq: Every day | ORAL | Status: DC
  Administered 2018-07-14: 20 mg via ORAL
  Filled 2018-07-13: qty 1

## 2018-07-13 MED ORDER — ENOXAPARIN SODIUM 40 MG/0.4ML ~~LOC~~ SOLN
40.0000 mg | SUBCUTANEOUS | Status: DC
  Administered 2018-07-14: 40 mg via SUBCUTANEOUS
  Filled 2018-07-13: qty 0.4

## 2018-07-13 NOTE — ED Notes (Addendum)
ED TO INPATIENT HANDOFF REPORT  ED Nurse Name and Phone #: Caprice Kluver [phone]  S Name/Age/Gender [name] 78 y.o. male Room/Bed: 022C/022C  Code Status   Code Status: Full Code  Home/SNF/Other Home Patient oriented to: self, place, time and situation Is this baseline? Yes   Triage Complete: Triage complete  Chief Complaint CP dizzinesss  Triage Note Pt from home via EMS. Syncopal episode with chest pain. 1 hour ago, fainted and family assisted to ground. When falling pt felt center to right chest pain 5/10. More painful when laying down or bending over. Hx of HTN and MI. Pt had meds today. AO x 4 per family. Pt had 324 ASA. BP 98/60 manual, 18 l wrist pt had 200 ml fluid. Last BP 102/72, 60 HR, CBG 130, 96% room air. No fever SOB, cough   Allergies Allergies  Allergen Reactions  . Ace Inhibitors Cough  . Lipitor [Atorvastatin Calcium] Other (See Comments)    Muscle aches  . Simvastatin Other (See Comments)    Muscle aches  . Advicor [Niacin-Lovastatin Er] Rash    Level of Care/Admitting Diagnosis ED Disposition    ED Disposition Condition Comment   Admit  Hospital Area: [facility] [[id number]]  Level of Care: Telemetry Cardiac [103]  I expect the patient will be discharged within 24 hours: No (not a candidate for 5C-Observation unit)  Covid Evaluation: Confirmed COVID Positive  Isolation Risk Level: Low Risk/Droplet (Less than 4L [place] supplementation)  Diagnosis: Syncope [206001]  Admitting Physician: Etta Quill [phone]  Attending Physician: Etta Quill [[id number]  PT Class (Do Not Modify): Observation [104]  PT Acc Code (Do Not Modify): Observation [10022]       B Medical/Surgery History Past Medical History:  Diagnosis Date  . Coronary artery disease    balloon angioplasty of diagonal branch of LAD in 2005  . Dyslipidemia   . GERD (gastroesophageal reflux disease)   . GERD without esophagitis 03/31/2018  . History of nuclear stress  test 10/21/2009   dipyridamole; mod perfusion defect due to infarct/scar with mild periinfarct ischemia in mid ant/apical ant/mid anterolateral/apical lateral; no significant ischemia   . Hx of pulmonary embolus   . Hyperglycemia 03/31/2018  . Hypertension   . Ischemic cardiomyopathy    h/o  . MI (myocardial infarction) ([place]) 07/09/2003   anterior   . Mixed hyperlipidemia 03/31/2018  . OA (osteoarthritis)   . 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 Unstable angina ([place]) 05/11/2017   Past Surgical History:  Procedure Laterality Date  . CARDIAC CATHETERIZATION  07/08/2003   2.25x20 Maverick balloon angioplasty of diagonal 2 branch of LAD (Dr. Lowella Dell)  . CARDIAC CATHETERIZATION  03/25/2004   diagonal vessel patent w/40% restenosis, 50-60% mid LAD stenosis (Dr. Jackie Plum)  . CARDIAC CATHETERIZATION  07/23/2006   no significant change from 2006 cath (Dr. Domenic Moras)   . CARDIAC SURGERY    . CORONARY ARTERY BYPASS GRAFT    . INTRAVASCULAR PRESSURE WIRE/FFR STUDY N/A 05/11/2017   Procedure: INTRAVASCULAR PRESSURE WIRE/FFR STUDY;  Surgeon: Nigel Mormon, MD;  Location: [facility];  Service: Cardiovascular;  Laterality: N/A;  . LEFT HEART CATH AND CORONARY ANGIOGRAPHY N/A 05/11/2017   Procedure: LEFT HEART CATH AND CORONARY ANGIOGRAPHY;  Surgeon: Nigel Mormon, MD;  Location: [facility];  Service: Cardiovascular;  Laterality: N/A;  . TRANSTHORACIC ECHOCARDIOGRAM  05/24/2011   EF 55-60%, mild LVH, grade 1 diastolic dysfunction; AV sclerosis; normal CVP  A IV Location/Drains/Wounds Patient Lines/Drains/Airways Status   Active Line/Drains/Airways    None          Intake/Output Last 24 hours No intake or output data in the 24 hours ending 07/13/18 2054  Labs/Imaging Results for orders placed or performed during the hospital encounter of 07/13/18 (from the past 48 hour(s))  CBC WITH DIFFERENTIAL     Status: Abnormal   Collection Time: 07/13/18  4:11 PM  Result Value Ref  Range   WBC 5.3 4.0 - 10.5 K/uL   RBC 4.55 4.22 - 5.81 MIL/uL   Hemoglobin 11.8 (L) 13.0 - 17.0 g/dL   HCT 36.4 (L) 39.0 - 52.0 %   MCV 80.0 80.0 - 100.0 fL   MCH 25.9 (L) 26.0 - 34.0 pg   MCHC 32.4 30.0 - 36.0 g/dL   RDW 13.1 11.5 - 15.5 %   Platelets 138 (L) 150 - 400 K/uL   nRBC 0.0 0.0 - 0.2 %   Neutrophils Relative % 60 %   Neutro Abs 3.2 1.7 - 7.7 K/uL   Lymphocytes Relative 28 %   Lymphs Abs 1.5 0.7 - 4.0 K/uL   Monocytes Relative 12 %   Monocytes Absolute 0.7 0.1 - 1.0 K/uL   Eosinophils Relative 0 %   Eosinophils Absolute 0.0 0.0 - 0.5 K/uL   Basophils Relative 0 %   Basophils Absolute 0.0 0.0 - 0.1 K/uL   Immature Granulocytes 0 %   Abs Immature Granulocytes 0.01 0.00 - 0.07 K/uL    Comment: Performed at [facility], [address], [place], [place] [address]  Comprehensive metabolic panel     Status: Abnormal   Collection Time: 07/13/18  4:11 PM  Result Value Ref Range   Sodium 135 135 - 145 mmol/L   Potassium 4.3 3.5 - 5.1 mmol/L   Chloride 103 98 - 111 mmol/L   CO2 20 (L) 22 - 32 mmol/L   Glucose, Bld 208 (H) 70 - 99 mg/dL   BUN 19 8 - 23 mg/dL   Creatinine, Ser 1.91 (H) 0.61 - 1.24 mg/dL   Calcium 8.4 (L) 8.9 - 10.3 mg/dL   Total Protein 7.1 6.5 - 8.1 g/dL   Albumin 3.6 3.5 - 5.0 g/dL   AST 32 15 - 41 U/L   ALT 33 0 - 44 U/L   Alkaline Phosphatase 94 38 - 126 U/L   Total Bilirubin 0.6 0.3 - 1.2 mg/dL   GFR calc non Af Amer 33 (L) >60 mL/min   GFR calc Af Amer 38 (L) >60 mL/min   Anion gap 12 5 - 15    Comment: Performed at [facility] [address], [place], [place] [address]  Troponin I - ONCE - STAT     Status: None   Collection Time: 07/13/18  4:11 PM  Result Value Ref Range   Troponin I <0.03 <0.03 ng/mL    Comment: Performed at [facility], [place] [address] [place], [place] [address]  CBG monitoring, ED     Status: Abnormal   Collection Time: 07/13/18  5:00 PM  Result Value Ref Range   Glucose-Capillary 164 (H) 70 - 99  mg/dL  SARS Coronavirus 2 (CEPHEID - Performed in [place] hospital lab), Hosp Order     Status: Abnormal   Collection Time: 07/13/18  5:09 PM  Result Value Ref Range   SARS Coronavirus 2 POSITIVE (A) NEGATIVE    Comment: RESULT CALLED TO, READ BACK BY AND  VERIFIED WITH: M COFFEY,RN AT 1846 07/13/2018 BY L BENFIELD (NOTE) If result is NEGATIVE SARS-CoV-2 target nucleic acids are NOT DETECTED. The SARS-CoV-2 RNA is generally detectable in upper and lower  respiratory specimens during the acute phase of infection. The lowest  concentration of SARS-CoV-2 viral copies this assay can detect is 250  copies / mL. A negative result does not preclude SARS-CoV-2 infection  and should not be used as the sole basis for treatment or other  patient management decisions.  A negative result may occur with  improper specimen collection / handling, submission of specimen other  than nasopharyngeal swab, presence of viral mutation(s) within the  areas targeted by this assay, and inadequate number of viral copies  (<250 copies / mL). A negative result must be combined with clinical  observations, patient history, and epidemiological information. If result is POSITIVE SARS-CoV-2 target nucleic acids are DETE CTED. The SARS-CoV-2 RNA is generally detectable in upper and lower  respiratory specimens during the acute phase of infection.  Positive  results are indicative of active infection with SARS-CoV-2.  Clinical  correlation with patient history and other diagnostic information is  necessary to determine patient infection status.  Positive results do  not rule out bacterial infection or co-infection with other viruses. If result is PRESUMPTIVE POSTIVE SARS-CoV-2 nucleic acids MAY BE PRESENT.   A presumptive positive result was obtained on the submitted specimen  and confirmed on repeat testing.  While 2019 novel coronavirus  (SARS-CoV-2) nucleic acids may be present in the submitted sample   additional confirmatory testing may be necessary for epidemiological  and / or clinical management purposes  to differentiate between  SARS-CoV-2 and other Sarbecovirus currently known to infect humans.  If clinically indicated additional testing with an alternate test  methodology (LAB [phone]) is advised. The SARS-CoV-2 RNA is generally  detectable in upper and lower respiratory specimens during the acute  phase of infection. The expected result is Negative. Fact Sheet for Patients:  StrictlyIdeas.no Fact Sheet for Healthcare Providers: BankingDealers.co.za This test is not yet approved or cleared by the [place] FDA and has been authorized for detection and/or diagnosis of SARS-CoV-2 by FDA under an Emergency Use Authorization (EUA).  This EUA will remain in effect (meaning this test can be used) for the duration of the COVID-19 declaration under Section 564(b)(1) of the Act, 21 U.S.C. section 360bbb-3(b)(1), unless the authorization is terminated or revoked sooner. Performed at [facility], [place] [address] [place], [place] [address]   D-dimer, quantitative (not at [facility])     Status: Abnormal   Collection Time: 07/13/18  7:26 PM  Result Value Ref Range   D-Dimer, Quant 1.06 (H) 0.00 - 0.50 ug/mL-FEU    Comment: (NOTE) At the manufacturer cut-off of 0.50 ug/mL FEU, this assay has been documented to exclude PE with a sensitivity and negative predictive value of 97 to 99%.  At this time, this assay has not been approved by the FDA to exclude DVT/VTE. Results should be correlated with clinical presentation. Performed at [facility], [place] [address] [place], [place] [address]   C-reactive protein     Status: Abnormal   Collection Time: 07/13/18  7:26 PM  Result Value Ref Range   CRP 2.1 (H) <1.0 mg/dL    Comment: Performed at [facility], [place] [address] [place], [place] [address]   Dg Chest Portable 1  View  Result Date: 07/13/2018 CLINICAL DATA:  Shortness of breath.  Right-sided chest pain. EXAM: PORTABLE CHEST 1 VIEW COMPARISON:  May 11, 2017 FINDINGS: A small left pleural effusion is identified. Mild opacity seen left base. The heart, hila, and mediastinum are unremarkable. No overt edema. No other acute abnormalities. IMPRESSION: Small left effusion. Mild left basilar opacity could represent atelectasis or early infiltrate. Recommend clinical correlation and follow-up to resolution. Electronically Signed   By: Dorise Bullion III M.D   On: 07/13/2018 16:46    Pending Labs Unresulted Labs (From admission, onward)    Start     Ordered   07/14/18 0500  D-dimer, quantitative (not at [facility])  Daily,   R     07/13/18 2032   07/14/18 0500  C-reactive protein  Daily,   R     07/13/18 2032   07/14/18 0500  CBC with Differential/Platelet  Daily,   R     07/13/18 2032   07/14/18 0500  Comprehensive metabolic panel  Daily,   R     07/13/18 2032   07/13/18 2030  Troponin I - Now Then Q6H  Now then every 6 hours,   R     07/13/18 2032   07/13/18 2029  ABO/Rh  Once,   R     07/13/18 2032   07/13/18 1928  Procalcitonin - Baseline  ONCE - STAT,   STAT     07/13/18 1928   07/13/18 1612  Urinalysis, Routine w reflex microscopic  ONCE - STAT,   STAT     07/13/18 1612          Vitals/Pain Today's Vitals   07/13/18 1845 07/13/18 1900 07/13/18 1915 07/13/18 2000  BP: 116/67 113/61 125/72 131/70  Pulse: (!) 59 (!) 59 60 (!) 59  Resp: 19 16 15 18   Temp:      TempSrc:      SpO2: 95% 93% 95% 96%  Weight:      PainSc:        Isolation Precautions Droplet and Contact precautions  Medications Medications  enoxaparin (LOVENOX) injection 40 mg (has no administration in time range)  albuterol (VENTOLIN HFA) 108 (90 Base) MCG/ACT inhaler 2 puff (has no administration in time range)    Mobility walks High fall risk   Focused Assessments NA   R Recommendations: See Admitting Provider  Note  Report given to:   Additional Notes: Pt daughter Madelaine Etienne [phone] requests to be contacted by in-patient physician for updates. Daughter requests to be interpreter because of rare dialect.

## 2018-07-13 NOTE — ED Triage Notes (Signed)
Pt from home via EMS. Syncopal episode with chest pain. 1 hour ago, fainted and family assisted to ground. When falling pt felt center to right chest pain 5/10. More painful when laying down or bending over. Hx of HTN and MI. Pt had meds today. AO x 4 per family. Pt had 324 ASA. BP 98/60 manual, 18 l wrist pt had 200 ml fluid. Last BP 102/72, 60 HR, CBG 130, 96% room air. No fever SOB, cough

## 2018-07-13 NOTE — H&P (Signed)
History and Physical    Esley Brooking DGL:[id number] DOB: 1940/11/30 DOA: 07/13/2018  PCP: [name], MD  Patient coming from: Home  I have personally briefly reviewed patient's old medical records in [place]  Chief Complaint: Syncope  HPI: Norbert Malkin is a 78 y.o. male with medical history significant of CAD s/p balloon angioplasty in 2005, last cath was in March 2019 showing moderate nonocclusive disease.  HTN, DM2, HLD.  Patient presents to the ED after a syncopal episode that occurred while at home.  Was witnessed by family members.  Prior to syncopal episode he was feeling fine other than a few body aches and mild headache for the past couple of days, no fever, chills, SOB, cough, etc.  No known sick contacts but had been to store.  Daughter states that he was sitting at the table eating when they noticed that his lips started turning blue.  Patient then tried to stand up and when he did syncopized.  Daughter was able to guide him to the floor.  Patient did not hit his head.  Daughter states that he was unconscious for about 2 minutes.  No seizure-like activity.  He initially had CP and SOB following the episode, though these have resolved by the time I am admitting him.   ED Course: COVID positive, trop neg, EKG shows T wave flattening in lateral leads, D.Dimer pending, Creat 1.9 up from 1.2 a year ago.  WBC nl, A febrile, BP 145/74.  CXR shows Mild L basilar opacity atelectasis vs early infiltrate.   Review of Systems: As per HPI otherwise 10 point review of systems negative.   Past Medical History:  Diagnosis Date  . Coronary artery disease    balloon angioplasty of diagonal branch of LAD in 2005  . Dyslipidemia   . GERD (gastroesophageal reflux disease)   . GERD without esophagitis 03/31/2018  . History of nuclear stress test 10/21/2009   dipyridamole; mod perfusion defect due to infarct/scar with mild periinfarct ischemia in mid ant/apical ant/mid  anterolateral/apical lateral; no significant ischemia   . Hx of pulmonary embolus   . Hyperglycemia 03/31/2018  . Hypertension   . Ischemic cardiomyopathy    h/o  . MI (myocardial infarction) ([place]) 07/09/2003   anterior   . Mixed hyperlipidemia 03/31/2018  . OA (osteoarthritis)   . 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 Unstable angina ([place]) 05/11/2017    Past Surgical History:  Procedure Laterality Date  . CARDIAC CATHETERIZATION  07/08/2003   2.25x20 Maverick balloon angioplasty of diagonal 2 branch of LAD (Dr. Lowella Dell)  . CARDIAC CATHETERIZATION  03/25/2004   diagonal vessel patent w/40% restenosis, 50-60% mid LAD stenosis (Dr. Jackie Plum)  . CARDIAC CATHETERIZATION  07/23/2006   no significant change from 2006 cath (Dr. Domenic Moras)   . INTRAVASCULAR PRESSURE WIRE/FFR STUDY N/A 05/11/2017   Procedure: INTRAVASCULAR PRESSURE WIRE/FFR STUDY;  Surgeon: Nigel Mormon, MD;  Location: [facility];  Service: Cardiovascular;  Laterality: N/A;  . LEFT HEART CATH AND CORONARY ANGIOGRAPHY N/A 05/11/2017   Procedure: LEFT HEART CATH AND CORONARY ANGIOGRAPHY;  Surgeon: Nigel Mormon, MD;  Location: [facility];  Service: Cardiovascular;  Laterality: N/A;  . TRANSTHORACIC ECHOCARDIOGRAM  05/24/2011   EF 55-60%, mild LVH, grade 1 diastolic dysfunction; AV sclerosis; normal CVP     reports that he quit smoking about 23 years ago. He has never used smokeless tobacco. He reports that he does not drink alcohol or  use drugs.  Allergies  Allergen Reactions  . Ace Inhibitors Cough  . Lipitor [Atorvastatin Calcium] Other (See Comments)    Muscle aches  . Simvastatin Other (See Comments)    Muscle aches  . Advicor [Niacin-Lovastatin Er] Rash    No family history on file. No one else in family has been ill or had symptoms of COVID-19  Prior to Admission medications   Medication Sig Start Date End Date Taking? Authorizing Provider  amLODipine (NORVASC) 5 MG tablet TAKE 1 TABLET ONCE A  DAY AT BREAKFAST Patient taking differently: Take 5 mg by mouth daily.  04/22/18  Yes Adrian Prows, MD  aspirin 81 MG chewable tablet Chew 1 tablet (81 mg total) by mouth daily. 05/12/17  Yes Patwardhan, Manish J, MD  colchicine 0.6 MG tablet Take 0.6 mg by mouth continuous as needed. Take 0.6 mg by mouth every eight hours until acute gout flare subsides 02/23/17  Yes [provider]  glimepiride (AMARYL) 1 MG tablet Take 1 tablet by mouth daily. 02/05/18  Yes [provider]  glucosamine-chondroitin 500-400 MG tablet Take 1 tablet by mouth daily.    Yes [provider]  isosorbide mononitrate (IMDUR) 60 MG 24 hr tablet Take 60 mg by mouth daily.   Yes [provider]  labetalol (NORMODYNE) 200 MG tablet Take 1 tablet (200 mg total) by mouth 2 (two) times daily. 04/18/18  Yes Adrian Prows, MD  losartan (COZAAR) 100 MG tablet TAKE 1 TABLET ONCE A DAY AT DINNER Patient taking differently: Take 100 mg by mouth daily with supper.  04/22/18  Yes Adrian Prows, MD  Multiple Vitamins-Minerals (CENTRUM SILVER 50+MEN) TABS Take 1 tablet by mouth daily.   Yes [provider]  nitroGLYCERIN (NITROSTAT) 0.4 MG SL tablet Place 0.4 mg under the tongue every 5 (five) minutes as needed for chest pain.    Yes [provider]  ondansetron (ZOFRAN) 8 MG tablet Take 8 mg by mouth every 8 (eight) hours as needed for nausea or vomiting.    Yes [provider]  pantoprazole (PROTONIX) 40 MG tablet Take 40 mg by mouth daily.   Yes [provider]  rosuvastatin (CRESTOR) 40 MG tablet Take 20 mg by mouth daily.    Yes [provider]    Physical Exam: Vitals:   07/13/18 1845 07/13/18 1900 07/13/18 1915 07/13/18 2000  BP: 116/67 113/61 125/72 131/70  Pulse: (!) 59 (!) 59 60 (!) 59  Resp: 19 16 15 18   Temp:      TempSrc:      SpO2: 95% 93% 95% 96%  Weight:        Constitutional: NAD, calm, comfortable Eyes: PERRL, lids and conjunctivae normal ENMT:  Mucous membranes are moist. Posterior pharynx clear of any exudate or lesions.Normal dentition.  Neck: normal, supple, no masses, no thyromegaly Respiratory: clear to auscultation bilaterally, no wheezing, no crackles. Normal respiratory effort. No accessory muscle use.  Cardiovascular: Regular rate and rhythm, no murmurs / rubs / gallops. No extremity edema. 2+ pedal pulses. No carotid bruits.  Abdomen: no tenderness, no masses palpated. No hepatosplenomegaly. Bowel sounds positive.  Musculoskeletal: no clubbing / cyanosis. No joint deformity upper and lower extremities. Good ROM, no contractures. Normal muscle tone.  Skin: no rashes, lesions, ulcers. No induration Neurologic: CN 2-12 grossly intact. Sensation intact, DTR normal. Strength 5/5 in all 4.  Psychiatric: Normal judgment and insight. Alert and oriented x 3. Normal mood.    Labs on Admission: I have personally  reviewed following labs and imaging studies  CBC: Recent Labs  Lab 07/13/18 1611  WBC 5.3  NEUTROABS 3.2  HGB 11.8*  HCT 36.4*  MCV 80.0  PLT 201*   Basic Metabolic Panel: Recent Labs  Lab 07/13/18 1611  NA 135  K 4.3  CL 103  CO2 20*  GLUCOSE 208*  BUN 19  CREATININE 1.91*  CALCIUM 8.4*   GFR: Estimated Creatinine Clearance: 28.7 mL/min (A) (by C-G formula based on SCr of 1.91 mg/dL (H)). Liver Function Tests: Recent Labs  Lab 07/13/18 1611  AST 32  ALT 33  ALKPHOS 94  BILITOT 0.6  PROT 7.1  ALBUMIN 3.6   No results for input(s): LIPASE, AMYLASE in the last 168 hours. No results for input(s): AMMONIA in the last 168 hours. Coagulation Profile: No results for input(s): INR, PROTIME in the last 168 hours. Cardiac Enzymes: Recent Labs  Lab 07/13/18 1611  TROPONINI <0.03   BNP (last 3 results) No results for input(s): PROBNP in the last 8760 hours. HbA1C: No results for input(s): HGBA1C in the last 72 hours. CBG: Recent Labs  Lab 07/13/18 1700  GLUCAP 164*   Lipid Profile: No  results for input(s): CHOL, HDL, LDLCALC, TRIG, CHOLHDL, LDLDIRECT in the last 72 hours. Thyroid Function Tests: No results for input(s): TSH, T4TOTAL, FREET4, T3FREE, THYROIDAB in the last 72 hours. Anemia Panel: No results for input(s): VITAMINB12, FOLATE, FERRITIN, TIBC, IRON, RETICCTPCT in the last 72 hours. Urine analysis:    Component Value Date/Time   COLORURINE YELLOW 05/24/2011 0236   APPEARANCEUR CLEAR 05/24/2011 0236   LABSPEC 1.011 05/24/2011 0236   PHURINE 5.5 05/24/2011 0236   GLUCOSEU NEGATIVE 05/24/2011 0236   HGBUR NEGATIVE 05/24/2011 0236   BILIRUBINUR NEGATIVE 05/24/2011 0236   KETONESUR NEGATIVE 05/24/2011 0236   PROTEINUR NEGATIVE 05/24/2011 0236   UROBILINOGEN 0.2 05/24/2011 0236   NITRITE NEGATIVE 05/24/2011 0236   LEUKOCYTESUR NEGATIVE 05/24/2011 0236    Radiological Exams on Admission: Dg Chest Portable 1 View  Result Date: 07/13/2018 CLINICAL DATA:  Shortness of breath.  Right-sided chest pain. EXAM: PORTABLE CHEST 1 VIEW COMPARISON:  May 11, 2017 FINDINGS: A small left pleural effusion is identified. Mild opacity seen left base. The heart, hila, and mediastinum are unremarkable. No overt edema. No other acute abnormalities. IMPRESSION: Small left effusion. Mild left basilar opacity could represent atelectasis or early infiltrate. Recommend clinical correlation and follow-up to resolution. Electronically Signed   By: Dorise Bullion III M.D   On: 07/13/2018 16:46    EKG: Independently reviewed.  Assessment/Plan Principal Problem:   Syncope Active Problems:   CAD (coronary artery disease)   Hypertension   Angina pectoris ([place])   COVID-19 virus infection   Renal insufficiency   DM2 (diabetes mellitus, type 2) ([place])    1. Syncope - with CP following syncope 1. DDx includes ACS, PE, worsening COVID, among others 2. Tele monitor 3. Continuous pulse ox 4. Serial trops 5. D.Dimer, CRP, procalcitonin 6. 2d echo 7. BLE venous duplex 8. VQ scan 2.  Renal insufficiency - 1. AKI on CKD vs progression of CKD 2. Monitor intake and output 3. Hold losartan 4. Repeat BMP in AM 3. COVID -19 positive - 1. Labs as above 2. No O2 requirement 3. No fever 4. CAD - 1. Continue ASA, statin, BB 2. CP work up as above 5. HTN - 1. Continue home meds except losartan 6. DM2 - 1. Hold Amaryl 2. SSI sensitive AC  DVT prophylaxis: Lovenox Code Status:  Full Family Communication: Daughter at bedside, she acts as interpreter Disposition Plan: Home after admit Consults called: None Admission status: Place in 81    [place], [place] Hospitalists  How to contact the [facility] Attending or Consulting provider [place] or covering provider during after hours [place], for this patient?  1. Check the care team in [facility] and look for a) attending/consulting TR[facility] provider listed and b) the [facility] team listed 2. Log into [email or web address]  Amion Physician Scheduling and messaging for groups and whole hospitals  On call and physician scheduling software for group practices, residents, hospitalists and other medical providers for call, clinic, rotation and shift schedules. OnCall [employer] is a hospital-wide system for scheduling doctors and paging doctors on call. EasyPlot is for scientific plotting and data analysis.  [email or web address]  and use [facility]'s universal password to access. If you do not have the password, please contact the hospital operator.  3. Locate the [facility] provider you are looking for under [facility] and page to a number that you can be directly reached. 4. If you still have difficulty reaching the provider, please page the [facility] (Director on Call) for the Hospitalists listed on amion for assistance.  07/13/2018, 8:53 PM

## 2018-07-13 NOTE — ED Notes (Signed)
Lab called and pt Covid +

## 2018-07-13 NOTE — ED Notes (Signed)
Pt states he doesn't remember falling.  Family around him, lips turned blue and appeared he was going to pass out.  Family caught him. LOC lasted approx 25 minutes.

## 2018-07-13 NOTE — ED Provider Notes (Signed)
[facility] EMERGENCY DEPARTMENT Provider Note   CSN: [id number] Arrival date & time: 07/13/18  1554    History   Chief Complaint Chief Complaint  Patient presents with  . Loss of Consciousness  . Chest Pain    HPI [name] is a 78 y.o. male with a past medical history of CAD, GERD, hypertension, hyperlipidemia who presents to the ED after a witnessed syncopal episode.  Patient has endorsed a mild headache over the past 3 days.  Otherwise has been in his normal state of health.  Family states that he was sitting at the table eating when they noticed that his lips started turning blue.  Patient then tried to stand up and when he did syncopized.  Family was able to guide him to the floor.  Patient did not hit his head.  Family states that he was unconscious for about 2 minutes.  No seizure-like activity.  Patient is now complaining of chest pain that he describes as a dual aching pain as well as shortness of breath.  The symptoms are new since passing out.     HPI  Past Medical History:  Diagnosis Date  . Coronary artery disease    balloon angioplasty of diagonal branch of LAD in 2005  . DM2 (diabetes mellitus, type 2) ([place])   . Dyslipidemia   . GERD (gastroesophageal reflux disease)   . GERD without esophagitis 03/31/2018  . History of nuclear stress test 10/21/2009   dipyridamole; mod perfusion defect due to infarct/scar with mild periinfarct ischemia in mid ant/apical ant/mid anterolateral/apical lateral; no significant ischemia   . Hx of pulmonary embolus   . Hyperglycemia 03/31/2018  . Hypertension   . Ischemic cardiomyopathy    h/o  . MI (myocardial infarction) ([place]) 07/09/2003   anterior   . Mixed hyperlipidemia 03/31/2018  . OA (osteoarthritis)   . 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 Unstable angina ([place]) 05/11/2017    Patient Active Problem List   Diagnosis Date Noted  . Syncope 07/13/2018  . COVID-19 virus infection 07/13/2018  . Renal insufficiency  07/13/2018  . DM2 (diabetes mellitus, type 2) ([place]) 07/13/2018  . Mixed hyperlipidemia 03/31/2018  . GERD without esophagitis 03/31/2018  . Angina pectoris ([place]) 05/11/2017  . CAD (coronary artery disease) 05/23/2011  . Hypertension 05/23/2011    Past Surgical History:  Procedure Laterality Date  . CARDIAC CATHETERIZATION  07/08/2003   2.25x20 Maverick balloon angioplasty of diagonal 2 branch of LAD (Dr. Lowella Dell)  . CARDIAC CATHETERIZATION  03/25/2004   diagonal vessel patent w/40% restenosis, 50-60% mid LAD stenosis (Dr. Jackie Plum)  . CARDIAC CATHETERIZATION  07/23/2006   no significant change from 2006 cath (Dr. Domenic Moras)   . INTRAVASCULAR PRESSURE WIRE/FFR STUDY N/A 05/11/2017   Procedure: INTRAVASCULAR PRESSURE WIRE/FFR STUDY;  Surgeon: Nigel Mormon, MD;  Location: [facility];  Service: Cardiovascular;  Laterality: N/A;  . LEFT HEART CATH AND CORONARY ANGIOGRAPHY N/A 05/11/2017   Procedure: LEFT HEART CATH AND CORONARY ANGIOGRAPHY;  Surgeon: Nigel Mormon, MD;  Location: [facility];  Service: Cardiovascular;  Laterality: N/A;  . TRANSTHORACIC ECHOCARDIOGRAM  05/24/2011   EF 55-60%, mild LVH, grade 1 diastolic dysfunction; AV sclerosis; normal CVP        Home Medications    Prior to Admission medications   Medication Sig Start Date End Date Taking? Authorizing Provider  amLODipine (NORVASC) 5 MG tablet TAKE 1 TABLET ONCE A DAY AT BREAKFAST Patient  taking differently: Take 5 mg by mouth daily.  04/22/18  Yes Adrian Prows, MD  aspirin 81 MG chewable tablet Chew 1 tablet (81 mg total) by mouth daily. 05/12/17  Yes Patwardhan, Manish J, MD  colchicine 0.6 MG tablet Take 0.6 mg by mouth continuous as needed. Take 0.6 mg by mouth every eight hours until acute gout flare subsides 02/23/17  Yes [provider]  glimepiride (AMARYL) 1 MG tablet Take 1 tablet by mouth daily. 02/05/18  Yes [provider]  glucosamine-chondroitin 500-400 MG tablet  Take 1 tablet by mouth daily.    Yes [provider]  isosorbide mononitrate (IMDUR) 60 MG 24 hr tablet Take 60 mg by mouth daily.   Yes [provider]  labetalol (NORMODYNE) 200 MG tablet Take 1 tablet (200 mg total) by mouth 2 (two) times daily. 04/18/18  Yes Adrian Prows, MD  losartan (COZAAR) 100 MG tablet TAKE 1 TABLET ONCE A DAY AT DINNER Patient taking differently: Take 100 mg by mouth daily with supper.  04/22/18  Yes Adrian Prows, MD  Multiple Vitamins-Minerals (CENTRUM SILVER 50+MEN) TABS Take 1 tablet by mouth daily.   Yes [provider]  nitroGLYCERIN (NITROSTAT) 0.4 MG SL tablet Place 0.4 mg under the tongue every 5 (five) minutes as needed for chest pain.    Yes [provider]  ondansetron (ZOFRAN) 8 MG tablet Take 8 mg by mouth every 8 (eight) hours as needed for nausea or vomiting.    Yes [provider]  pantoprazole (PROTONIX) 40 MG tablet Take 40 mg by mouth daily.   Yes [provider]  rosuvastatin (CRESTOR) 40 MG tablet Take 20 mg by mouth daily.    Yes [provider]    Family History No family history on file.  Social History Social History   Tobacco Use  . Smoking status: Former Smoker    Last attempt to quit: 12/28/1994    Years since quitting: 23.5  . Smokeless tobacco: Never Used  Substance Use Topics  . Alcohol use: No  . Drug use: No     Allergies   Ace inhibitors; Lipitor [atorvastatin calcium]; Simvastatin; and Advicor [niacin-lovastatin er]   Review of Systems Review of Systems  Constitutional: Positive for appetite change (decreased appetite today). Negative for activity change, chills and fever.  Eyes: Negative for pain and visual disturbance.  Respiratory: Positive for shortness of breath. Negative for cough.   Cardiovascular: Positive for chest pain. Negative for palpitations and leg swelling.  Gastrointestinal: Positive for nausea (prior to the episode). Negative for abdominal pain  and vomiting.  Genitourinary: Negative for dysuria.  Musculoskeletal: Negative for arthralgias, back pain and neck pain.  Skin: Negative for color change and rash.  Neurological: Positive for syncope and headaches (for the past 3 days).  Psychiatric/Behavioral: Negative for confusion.  All other systems reviewed and are negative.    Physical Exam Updated Vital Signs BP (!) 145/74   Pulse (!) 58   Temp 98.8 F (37.1 C) (Oral)   Resp 17   Wt 72.6 kg   SpO2 94%   BMI 27.46 kg/m   Physical Exam Vitals signs and nursing note reviewed.  Constitutional:      General: He is not in acute distress.    Appearance: He is well-developed. He is not ill-appearing.  HENT:     Head: Normocephalic and atraumatic.  Eyes:     Conjunctiva/sclera: Conjunctivae normal.  Neck:     Musculoskeletal: Neck supple.  Cardiovascular:  Rate and Rhythm: Normal rate and regular rhythm.     Heart sounds: No murmur.  Pulmonary:     Effort: Pulmonary effort is normal. No tachypnea, accessory muscle usage or respiratory distress.     Breath sounds: Normal breath sounds. No decreased breath sounds, wheezing, rhonchi or rales.  Abdominal:     Palpations: Abdomen is soft.     Tenderness: There is no abdominal tenderness.  Musculoskeletal:     Right lower leg: He exhibits no tenderness. No edema.     Left lower leg: He exhibits no tenderness. No edema.  Skin:    General: Skin is warm and dry.  Neurological:     General: No focal deficit present.     Mental Status: He is alert and oriented to person, place, and time.     Cranial Nerves: Cranial nerves are intact. No cranial nerve deficit, dysarthria or facial asymmetry.     Sensory: Sensation is intact.     Motor: Motor function is intact. No weakness.     Coordination: Coordination is intact.      ED Treatments / Results  Labs (all labs ordered are listed, but only abnormal results are displayed) Labs Reviewed  SARS CORONAVIRUS 2 (HOSPITAL  ORDER, [facility]) - Abnormal; Notable for the following components:      Result Value   SARS Coronavirus 2 POSITIVE (*)    All other components within normal limits  CBC WITH DIFFERENTIAL/PLATELET - Abnormal; Notable for the following components:   Hemoglobin 11.8 (*)    HCT 36.4 (*)    MCH 25.9 (*)    Platelets 138 (*)    All other components within normal limits  COMPREHENSIVE METABOLIC PANEL - Abnormal; Notable for the following components:   CO2 20 (*)    Glucose, Bld 208 (*)    Creatinine, Ser 1.91 (*)    Calcium 8.4 (*)    GFR calc non Af Amer 33 (*)    GFR calc Af Amer 38 (*)    All other components within normal limits  D-DIMER, QUANTITATIVE (NOT AT [facility]) - Abnormal; Notable for the following components:   D-Dimer, Quant 1.06 (*)    All other components within normal limits  C-REACTIVE PROTEIN - Abnormal; Notable for the following components:   CRP 2.1 (*)    All other components within normal limits  CBG MONITORING, ED - Abnormal; Notable for the following components:   Glucose-Capillary 164 (*)    All other components within normal limits  TROPONIN I  PROCALCITONIN  TROPONIN I  URINALYSIS, ROUTINE W REFLEX MICROSCOPIC  TROPONIN I  TROPONIN I  D-DIMER, QUANTITATIVE (NOT AT [facility])  C-REACTIVE PROTEIN  CBC WITH DIFFERENTIAL/PLATELET  COMPREHENSIVE METABOLIC PANEL  ABO/RH    EKG EKG Interpretation  Date/Time:  Saturday Jul 13 2018 16:08:09 EDT Ventricular Rate:  68 PR Interval:    QRS Duration: 86 QT Interval:  391 QTC Calculation: 416 R Axis:   -15 Text Interpretation:  Sinus rhythm Borderline left axis deviation Nonspecific T wave abnormality No significant change since last tracing Confirmed by Lajean Saver [id number]) on 07/13/2018 4:18:17 PM   Radiology Dg Chest Portable 1 View  Result Date: 07/13/2018 CLINICAL DATA:  Shortness of breath.  Right-sided chest pain. EXAM: PORTABLE CHEST 1 VIEW COMPARISON:  May 11, 2017 FINDINGS:  A small left pleural effusion is identified. Mild opacity seen left base. The heart, hila, and mediastinum are unremarkable. No overt edema. No other acute abnormalities.  IMPRESSION: Small left effusion. Mild left basilar opacity could represent atelectasis or early infiltrate. Recommend clinical correlation and follow-up to resolution. Electronically Signed   By: Dorise Bullion III M.D   On: 07/13/2018 16:46    Procedures Procedures (including critical care time)  Medications Ordered in ED Medications  enoxaparin (LOVENOX) injection 40 mg (has no administration in time range)  albuterol (VENTOLIN HFA) 108 (90 Base) MCG/ACT inhaler 2 puff (has no administration in time range)  amLODipine (NORVASC) tablet 5 mg (has no administration in time range)  labetalol (NORMODYNE) tablet 200 mg (has no administration in time range)  isosorbide mononitrate (IMDUR) 24 hr tablet 60 mg (has no administration in time range)  aspirin chewable tablet 81 mg (has no administration in time range)  rosuvastatin (CRESTOR) tablet 20 mg (has no administration in time range)  pantoprazole (PROTONIX) EC tablet 40 mg (has no administration in time range)  ondansetron (ZOFRAN) tablet 8 mg (has no administration in time range)  multivitamin with minerals tablet 1 tablet (has no administration in time range)  insulin aspart (novoLOG) injection 0-9 Units (has no administration in time range)     Initial Impression / Assessment and Plan / ED Course  I have reviewed the triage vital signs and the nursing notes.  Pertinent labs & imaging results that were available during my care of the patient were reviewed by me and considered in my medical decision making (see chart for details).        [name] is a 78 y.o. male with a past medical history of CAD, GERD, hypertension, hyperlipidemia who presents to the ED after a witnessed syncopal episode now with chest pain and SOB.  On initial exam patient well appearing, not  in acute distress. VSS. Physical exam as above. CBG 130  Multiple labs obtained as well as EKG and CXR.  Patient will likely need admission. COVID swab obtained for admission as well as patient is endorsing some SOB. Currently satting 95% on RA.   EKG as above. First troponin negative.  Labs significant for an AKI. COVID +.  Informed patient of results.   Patient admitted to the hospitalist for further work up and management.  Final Clinical Impressions(s) / ED Diagnoses   Final diagnoses:  Syncope, unspecified syncope type  COVID-19 virus infection  AKI (acute kidney injury) [facility])    ED Discharge Orders    None       Doneta Public, MD 07/13/18 2308    Lajean Saver, MD 07/14/18 [phone]

## 2018-07-13 NOTE — ED Notes (Signed)
Pt daughter Madelaine Etienne [phone] requests to be contacted by in-patient physician for updates. Daughter requests to be interpreter because of rare dialect.

## 2018-07-14 ENCOUNTER — Observation Stay (HOSPITAL_COMMUNITY): Payer: Medicare Other

## 2018-07-14 DIAGNOSIS — U071 COVID-19: Secondary | ICD-10-CM | POA: Diagnosis not present

## 2018-07-14 DIAGNOSIS — E119 Type 2 diabetes mellitus without complications: Secondary | ICD-10-CM | POA: Diagnosis not present

## 2018-07-14 DIAGNOSIS — N179 Acute kidney failure, unspecified: Secondary | ICD-10-CM | POA: Diagnosis not present

## 2018-07-14 DIAGNOSIS — N183 Chronic kidney disease, stage 3 unspecified: Secondary | ICD-10-CM

## 2018-07-14 DIAGNOSIS — R55 Syncope and collapse: Secondary | ICD-10-CM | POA: Diagnosis not present

## 2018-07-14 LAB — URINALYSIS, ROUTINE W REFLEX MICROSCOPIC
Bilirubin Urine: NEGATIVE
Glucose, UA: NEGATIVE mg/dL
Hgb urine dipstick: NEGATIVE
Ketones, ur: NEGATIVE mg/dL
Leukocytes,Ua: NEGATIVE
Nitrite: NEGATIVE
Protein, ur: NEGATIVE mg/dL
Specific Gravity, Urine: 1.011 (ref 1.005–1.030)
pH: 5 (ref 5.0–8.0)

## 2018-07-14 LAB — CBC WITH DIFFERENTIAL/PLATELET
Abs Immature Granulocytes: 0.01 10*3/uL (ref 0.00–0.07)
Basophils Absolute: 0 10*3/uL (ref 0.0–0.1)
Basophils Relative: 0 %
Eosinophils Absolute: 0 10*3/uL (ref 0.0–0.5)
Eosinophils Relative: 0 %
HCT: 37.6 % — ABNORMAL LOW (ref 39.0–52.0)
Hemoglobin: 12.5 g/dL — ABNORMAL LOW (ref 13.0–17.0)
Immature Granulocytes: 0 %
Lymphocytes Relative: 34 %
Lymphs Abs: 1.6 10*3/uL (ref 0.7–4.0)
MCH: 26.2 pg (ref 26.0–34.0)
MCHC: 33.2 g/dL (ref 30.0–36.0)
MCV: 78.7 fL — ABNORMAL LOW (ref 80.0–100.0)
Monocytes Absolute: 0.7 10*3/uL (ref 0.1–1.0)
Monocytes Relative: 14 %
Neutro Abs: 2.4 10*3/uL (ref 1.7–7.7)
Neutrophils Relative %: 52 %
Platelets: 151 10*3/uL (ref 150–400)
RBC: 4.78 MIL/uL (ref 4.22–5.81)
RDW: 12.8 % (ref 11.5–15.5)
WBC: 4.6 10*3/uL (ref 4.0–10.5)
nRBC: 0 % (ref 0.0–0.2)

## 2018-07-14 LAB — GLUCOSE, CAPILLARY
Glucose-Capillary: 95 mg/dL (ref 70–99)
Glucose-Capillary: 140 mg/dL — ABNORMAL HIGH (ref 70–99)

## 2018-07-14 LAB — CBG MONITORING, ED: Glucose-Capillary: 89 mg/dL (ref 70–99)

## 2018-07-14 LAB — COMPREHENSIVE METABOLIC PANEL
ALT: 35 U/L (ref 0–44)
AST: 34 U/L (ref 15–41)
Albumin: 3.6 g/dL (ref 3.5–5.0)
Alkaline Phosphatase: 108 U/L (ref 38–126)
Anion gap: 14 (ref 5–15)
BUN: 17 mg/dL (ref 8–23)
CO2: 22 mmol/L (ref 22–32)
Calcium: 8.9 mg/dL (ref 8.9–10.3)
Chloride: 100 mmol/L (ref 98–111)
Creatinine, Ser: 1.55 mg/dL — ABNORMAL HIGH (ref 0.61–1.24)
GFR calc Af Amer: 49 mL/min — ABNORMAL LOW (ref >60)
GFR calc non Af Amer: 42 mL/min — ABNORMAL LOW (ref >60)
Glucose, Bld: 94 mg/dL (ref 70–99)
Potassium: 4.3 mmol/L (ref 3.5–5.1)
Sodium: 136 mmol/L (ref 135–145)
Total Bilirubin: 0.7 mg/dL (ref 0.3–1.2)
Total Protein: 7.5 g/dL (ref 6.5–8.1)

## 2018-07-14 LAB — ECHOCARDIOGRAM LIMITED
Height: 66 in
Weight: 2560 oz

## 2018-07-14 LAB — C-REACTIVE PROTEIN: CRP: 2.9 mg/dL — ABNORMAL HIGH (ref <1.0)

## 2018-07-14 LAB — D-DIMER, QUANTITATIVE: D-Dimer, Quant: 1.16 ug/mL-FEU — ABNORMAL HIGH (ref 0.00–0.50)

## 2018-07-14 LAB — TROPONIN I
Troponin I: <0.03 ng/mL (ref <0.03)
Troponin I: <0.03 ng/mL (ref <0.03)

## 2018-07-14 LAB — FERRITIN: Ferritin: 119 ng/mL (ref 24–336)

## 2018-07-14 LAB — MRSA PCR SCREENING: MRSA by PCR: NEGATIVE

## 2018-07-14 MED ORDER — LABETALOL HCL 5 MG/ML IV SOLN
5.0000 mg | Freq: Once | INTRAVENOUS | Status: AC
  Administered 2018-07-14: 5 mg via INTRAVENOUS
  Filled 2018-07-14: qty 4

## 2018-07-14 MED ORDER — ACETAMINOPHEN 325 MG PO TABS
650.0000 mg | ORAL_TABLET | Freq: Four times a day (QID) | ORAL | Status: DC | PRN
  Administered 2018-07-14: 650 mg via ORAL
  Filled 2018-07-14: qty 2

## 2018-07-14 MED ORDER — TECHNETIUM TO 99M ALBUMIN AGGREGATED
1.6550 | Freq: Once | INTRAVENOUS | Status: AC | PRN
  Administered 2018-07-14: 1.655 via INTRAVENOUS

## 2018-07-14 NOTE — Progress Notes (Signed)
  Echocardiogram 2D Echocardiogram has been performed.  [name] 07/14/2018, 9:22 AM

## 2018-07-14 NOTE — ED Notes (Signed)
Called to give report x1. 

## 2018-07-14 NOTE — Discharge Summary (Addendum)
Discharge Summary  Conley Delisle JJH:[id number] DOB: 20-Jun-1940  PCP: [name], MD  Admit date: 07/13/2018 Discharge date: 07/14/2018  Time spent: 50mins, more than 50% time spent on coordination of care.  Recommendations for Outpatient Follow-up:  1. F/u with PCP within a week  for hospital discharge follow up, repeat cbc/bmp at follow up.  2. Patient is advised to come back to ED for evaluation if develop SOB. 3. Infection prevention contacted regarding family member who works at [employer] 4. Other family members are aware that local health department will contact them.  Discharge Diagnoses:  Active Hospital Problems   Diagnosis Date Noted  . Syncope 07/13/2018  . COVID-19 virus infection 07/13/2018  . Renal insufficiency 07/13/2018  . DM2 (diabetes mellitus, type 2) ([place]) 07/13/2018  . Angina pectoris ([place]) 05/11/2017  . Hypertension 05/23/2011  . CAD (coronary artery disease) 05/23/2011    Resolved Hospital Problems  No resolved problems to display.    Discharge Condition: stable  Diet recommendation: heart healthy/carb modified  Filed Weights   07/13/18 1559  Weight: 72.6 kg    History of present illness: (per admitting MD Dr Alcario Drought) PCP: [name], MD  Patient coming from: Home  I have personally briefly reviewed patient's old medical records in [place]  Chief Complaint: Syncope  HPI: [name] is a 78 y.o. male with medical history significant of CAD s/p balloon angioplasty in 2005, last cath was in March 2019 showing moderate nonocclusive disease.  HTN, DM2, HLD.  Patient presents to the ED after a syncopal episode that occurred while at home.  Was witnessed by family members.  Prior to syncopal episode he was feeling fine other than a few body aches and mild headache for the past couple of days, no fever, chills, SOB, cough, etc.  No known sick contacts but had been to store.  Daughter states that he was sitting at the table eating  when they noticed that his lips started turning blue. Patient then tried to stand up and when he did syncopized. Daughter was able to guide him to the floor. Patient did not hit his head. Daughter states that he was unconscious for about 2 minutes. No seizure-like activity.  He initially had CP and SOB following the episode, though these have resolved by the time I am admitting him.   ED Course: COVID positive, trop neg, EKG shows T wave flattening in lateral leads, D.Dimer pending, Creat 1.9 up from 1.2 a year ago.  WBC nl, A febrile, BP 145/74.  CXR shows Mild L basilar opacity atelectasis vs early infiltrate.   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 Mild left basilar opacity could represent atelectasis or early infiltrate. Recommend clinical correlation and follow-up to resolution." -no lymphopenia, inflammatory markers ferritin wnl, crp mild elevated at 2.9, procalcitonin less than 0.1 -patient is advised to return to ED if symptom get worse. -infection prevention will contact wife who works at [employer] -  local health department will contact other family members   Other medical problems including HTN, CAD and noninsulin dependent dm2,  are stable at baseline, continue all home meds F/u with pcp  Procedures:  VQ scan  Consultations:  Infection prevention Dr Baxter Flattery   Code status:  full code Family contact: daughter over the phone  Discharge Exam: BP (!) 157/85   Pulse 69   Temp 99.1 F (37.3 C) (Oral)   Resp 20   Ht 5\' 6"  (1.676 m)   Wt 72.6 kg   SpO2 95%   BMI 25.82 kg/m   General: NAD Cardiovascular: RRR Respiratory: diminished at basis  Discharge Instructions You were cared for by a hospitalist during your hospital stay. If you have any questions about your discharge medications or the care you received while you were in the hospital after you are discharged, you can call the unit and asked to speak with the hospitalist on call if the hospitalist that took care of you is not available. Once you are discharged, your primary care physician will handle any further medical issues. Please note that NO REFILLS for any discharge medications will be authorized once you are discharged, as it is imperative that you return to your primary care physician (or establish a relationship with a primary care physician if you do not have one) for your aftercare needs so that they can reassess your need for medications and monitor your lab values.  Discharge Instructions    Diet - low sodium heart healthy   Complete by:  As directed    Increase activity slowly   Complete by:  As directed    MyChart COVID-19 home monitoring program   Complete by:  Jul 14, 2018    Is the patient willing to use the [place] for home monitoring?:  No     Allergies as of 07/14/2018      Reactions   Ace Inhibitors Cough   Lipitor [atorvastatin Calcium] Other (See Comments)   Muscle aches   Simvastatin Other (See Comments)   Muscle aches   Advicor [niacin-lovastatin Er] Rash      Medication List    TAKE these medications   amLODipine 5 MG tablet Commonly known as:  NORVASC TAKE 1 TABLET ONCE A DAY AT BREAKFAST What changed:  See the new instructions.   aspirin 81 MG chewable tablet Chew 1 tablet (81 mg total) by mouth daily.   Centrum Silver 50+Men Tabs Take 1 tablet by  mouth daily.   colchicine 0.6 MG tablet Take 0.6 mg by mouth continuous as needed. Take 0.6 mg by mouth every eight hours until acute gout flare subsides   glimepiride 1 MG tablet Commonly known as:  AMARYL Take 1 tablet by mouth daily.   glucosamine-chondroitin 500-400 MG tablet Take 1 tablet by mouth daily.   isosorbide mononitrate 60 MG 24 hr tablet Commonly known as:  IMDUR Take 60 mg by mouth daily.   labetalol 200 MG tablet Commonly known as:  NORMODYNE Take 1 tablet (200 mg total) by mouth 2 (two) times daily.   losartan 100 MG tablet Commonly known as:  COZAAR TAKE 1 TABLET ONCE A DAY AT DINNER What changed:  See the new instructions.   nitroGLYCERIN 0.4 MG SL tablet Commonly known as:  NITROSTAT Place 0.4 mg under the tongue every 5 (five) minutes as needed for chest pain.   ondansetron 8 MG tablet Commonly known as:  ZOFRAN Take 8 mg by mouth every 8 (eight)  hours as needed for nausea or vomiting.   pantoprazole 40 MG tablet Commonly known as:  PROTONIX Take 40 mg by mouth daily.   rosuvastatin 40 MG tablet Commonly known as:  CRESTOR Take 20 mg by mouth daily.      Allergies  Allergen Reactions  . Ace Inhibitors Cough  . Lipitor [Atorvastatin Calcium] Other (See Comments)    Muscle aches  . Simvastatin Other (See Comments)    Muscle aches  . Advicor [Niacin-Lovastatin Er] Rash   Follow-up Information    [name], MD Follow up in 1 week(s).   Specialty:  Family Medicine Why:  hospital discharge follow up, repeat cbc/bmp at follow up. Contact information: [address], [place] [address] [phone]        please return to ED for evaluation if you develope short of breath Follow up.            The results of significant diagnostics from this hospitalization (including imaging, microbiology, ancillary and laboratory) are listed below for reference.    Significant Diagnostic Studies: Nm Pulmonary Perfusion   Result Date: 07/14/2018 CLINICAL DATA:  Concern for pulmonary embolism. Syncope. Covid positive patient. EXAM: NUCLEAR MEDICINE PERFUSION LUNG SCAN TECHNIQUE: Perfusion images were obtained in multiple projections after intravenous injection of radiopharmaceutical. Ventilation portion of exam intentionally omitted if possible during the COVID-19 epidemic. RADIOPHARMACEUTICALS:  1.65 mCi Tc-75m MAA COMPARISON:  Chest radiograph 07/13/2018 FINDINGS: Normal perfusion pattern. No wedge-shaped peripheral perfusion defect. IMPRESSION: No evidence of pulmonary embolism.  Normal perfusion scan. Electronically Signed   By: Suzy Bouchard M.D.   On: 07/14/2018 11:59   Dg Chest Portable 1 View  Result Date: 07/13/2018 CLINICAL DATA:  Shortness of breath.  Right-sided chest pain. EXAM: PORTABLE CHEST 1 VIEW COMPARISON:  May 11, 2017 FINDINGS: A small left pleural effusion is identified. Mild opacity seen left base. The heart, hila, and mediastinum are unremarkable. No overt edema. No other acute abnormalities. IMPRESSION: Small left effusion. Mild left basilar opacity could represent atelectasis or early infiltrate. Recommend clinical correlation and follow-up to resolution. Electronically Signed   By: Dorise Bullion III M.D   On: 07/13/2018 16:46    Microbiology: Recent Results (from the past 240 hour(s))  SARS Coronavirus 2 (CEPHEID - Performed in [facility] hospital lab), Hosp Order     Status: Abnormal   Collection Time: 07/13/18  5:09 PM  Result Value Ref Range Status   SARS Coronavirus 2 POSITIVE (A) NEGATIVE Final    Comment: RESULT CALLED TO, READ BACK BY AND VERIFIED WITH: M COFFEY,RN AT 1846 07/13/2018 BY L BENFIELD (NOTE) If result is NEGATIVE SARS-CoV-2 target nucleic acids are NOT DETECTED. The SARS-CoV-2 RNA is generally detectable in upper and lower  respiratory specimens during the acute phase of infection. The lowest  concentration of SARS-CoV-2 viral copies this assay can detect is  250  copies / mL. A negative result does not preclude SARS-CoV-2 infection  and should not be used as the sole basis for treatment or other  patient management decisions.  A negative result may occur with  improper specimen collection / handling, submission of specimen other  than nasopharyngeal swab, presence of viral mutation(s) within the  areas targeted by this assay, and inadequate number of viral copies  (<250 copies / mL). A negative result must be combined with clinical  observations, patient history, and epidemiological information. If result is POSITIVE SARS-CoV-2 target nucleic acids are DETE CTED. The SARS-CoV-2 RNA is generally  detectable in upper and lower  respiratory specimens during the acute phase of infection.  Positive  results are indicative of active infection with SARS-CoV-2.  Clinical  correlation with patient history and other diagnostic information is  necessary to determine patient infection status.  Positive results do  not rule out bacterial infection or co-infection with other viruses. If result is PRESUMPTIVE POSTIVE SARS-CoV-2 nucleic acids MAY BE PRESENT.   A presumptive positive result was obtained on the submitted specimen  and confirmed on repeat testing.  While 2019 novel coronavirus  (SARS-CoV-2) nucleic acids may be present in the submitted sample  additional confirmatory testing may be necessary for epidemiological  and / or clinical management purposes  to differentiate between  SARS-CoV-2 and other Sarbecovirus currently known to infect humans.  If clinically indicated additional testing with an alternate test  methodology (LAB [phone]) is advised. The SARS-CoV-2 RNA is generally  detectable in upper and lower respiratory specimens during the acute  phase of infection. The expected result is Negative. Fact Sheet for Patients:  StrictlyIdeas.no Fact Sheet for Healthcare Providers:  BankingDealers.co.za This test is not yet approved or cleared by the [place] FDA and has been authorized for detection and/or diagnosis of SARS-CoV-2 by FDA under an Emergency Use Authorization (EUA).  This EUA will remain in effect (meaning this test can be used) for the duration of the COVID-19 declaration under Section 564(b)(1) of the Act, 21 U.S.C. section 360bbb-3(b)(1), unless the authorization is terminated or revoked sooner. Performed at [facility], [place] [address] [place], [place] [address]   MRSA PCR Screening     Status: None   Collection Time: 07/14/18  5:56 AM  Result Value Ref Range Status   MRSA by PCR NEGATIVE NEGATIVE Final    Comment:        The GeneXpert MRSA Assay (FDA approved for NASAL specimens only), is one component of a comprehensive MRSA colonization surveillance program. It is not intended to diagnose MRSA infection nor to guide or monitor treatment for MRSA infections. Performed at [facility], [place] [address], [place], [place] [address]      Labs: Basic Metabolic Panel: Recent Labs  Lab 07/13/18 1611 07/14/18 0653  NA 135 136  K 4.3 4.3  CL 103 100  CO2 20* 22  GLUCOSE 208* 94  BUN 19 17  CREATININE 1.91* 1.55*  CALCIUM 8.4* 8.9   Liver Function Tests: Recent Labs  Lab 07/13/18 1611 07/14/18 0653  AST 32 34  ALT 33 35  ALKPHOS 94 108  BILITOT 0.6 0.7  PROT 7.1 7.5  ALBUMIN 3.6 3.6   No results for input(s): LIPASE, AMYLASE in the last 168 hours. No results for input(s): AMMONIA in the last 168 hours. CBC: Recent Labs  Lab 07/13/18 1611 07/14/18 0653  WBC 5.3 4.6  NEUTROABS 3.2 2.4  HGB 11.8* 12.5*  HCT 36.4* 37.6*  MCV 80.0 78.7*  PLT 138* 151   Cardiac Enzymes: Recent Labs  Lab 07/13/18 1611 07/13/18 2059 07/14/18 0231 07/14/18 0653  TROPONINI <0.03 <0.03 <0.03 <0.03   BNP: BNP (last 3 results) No results for input(s): BNP in the last 8760 hours.  ProBNP (last 3  results) No results for input(s): PROBNP in the last 8760 hours.  CBG: Recent Labs  Lab 07/13/18 1700 07/14/18 0152 07/14/18 0851 07/14/18 1209  GLUCAP 164* 89 95 140*       Signed:  Florencia Reasons MD, PhD  [facility] 07/14/2018, 1:52 PM

## 2018-07-16 ENCOUNTER — Encounter (HOSPITAL_COMMUNITY): Payer: Self-pay

## 2018-07-16 ENCOUNTER — Other Ambulatory Visit: Payer: Self-pay

## 2018-07-16 ENCOUNTER — Emergency Department (HOSPITAL_COMMUNITY): Payer: Medicare Other

## 2018-07-16 ENCOUNTER — Inpatient Hospital Stay (HOSPITAL_COMMUNITY)
Admission: EM | Admit: 2018-07-16 | Discharge: 2018-07-18 | DRG: 177 | Disposition: A | Discharge status: Home / Self Care | Payer: Medicare Other | Attending: Internal Medicine | Admitting: Internal Medicine

## 2018-07-16 DIAGNOSIS — J189 Pneumonia, unspecified organism: Secondary | ICD-10-CM

## 2018-07-16 DIAGNOSIS — K219 Gastro-esophageal reflux disease without esophagitis: Secondary | ICD-10-CM | POA: Diagnosis present

## 2018-07-16 DIAGNOSIS — I255 Ischemic cardiomyopathy: Secondary | ICD-10-CM | POA: Diagnosis present

## 2018-07-16 DIAGNOSIS — U071 COVID-19: Secondary | ICD-10-CM

## 2018-07-16 DIAGNOSIS — I252 Old myocardial infarction: Secondary | ICD-10-CM

## 2018-07-16 DIAGNOSIS — E782 Mixed hyperlipidemia: Secondary | ICD-10-CM | POA: Diagnosis present

## 2018-07-16 DIAGNOSIS — Z79899 Other long term (current) drug therapy: Secondary | ICD-10-CM

## 2018-07-16 DIAGNOSIS — I2510 Atherosclerotic heart disease of native coronary artery without angina pectoris: Secondary | ICD-10-CM | POA: Diagnosis present

## 2018-07-16 DIAGNOSIS — D649 Anemia, unspecified: Secondary | ICD-10-CM | POA: Diagnosis present

## 2018-07-16 DIAGNOSIS — Z9861 Coronary angioplasty status: Secondary | ICD-10-CM

## 2018-07-16 DIAGNOSIS — Z7982 Long term (current) use of aspirin: Secondary | ICD-10-CM

## 2018-07-16 DIAGNOSIS — N183 Chronic kidney disease, stage 3 unspecified: Secondary | ICD-10-CM | POA: Diagnosis present

## 2018-07-16 DIAGNOSIS — M1990 Unspecified osteoarthritis, unspecified site: Secondary | ICD-10-CM | POA: Diagnosis present

## 2018-07-16 DIAGNOSIS — N179 Acute kidney failure, unspecified: Secondary | ICD-10-CM | POA: Diagnosis present

## 2018-07-16 DIAGNOSIS — I25119 Atherosclerotic heart disease of native coronary artery with unspecified angina pectoris: Secondary | ICD-10-CM | POA: Diagnosis present

## 2018-07-16 DIAGNOSIS — Z87891 Personal history of nicotine dependence: Secondary | ICD-10-CM

## 2018-07-16 DIAGNOSIS — E860 Dehydration: Secondary | ICD-10-CM | POA: Diagnosis present

## 2018-07-16 DIAGNOSIS — R55 Syncope and collapse: Secondary | ICD-10-CM | POA: Diagnosis present

## 2018-07-16 DIAGNOSIS — Z86711 Personal history of pulmonary embolism: Secondary | ICD-10-CM

## 2018-07-16 DIAGNOSIS — I129 Hypertensive chronic kidney disease with stage 1 through stage 4 chronic kidney disease, or unspecified chronic kidney disease: Secondary | ICD-10-CM | POA: Diagnosis present

## 2018-07-16 DIAGNOSIS — J1289 Other viral pneumonia: Secondary | ICD-10-CM | POA: Diagnosis present

## 2018-07-16 DIAGNOSIS — Z888 Allergy status to other drugs, medicaments and biological substances status: Secondary | ICD-10-CM

## 2018-07-16 DIAGNOSIS — E1122 Type 2 diabetes mellitus with diabetic chronic kidney disease: Secondary | ICD-10-CM | POA: Diagnosis present

## 2018-07-16 DIAGNOSIS — R7611 Nonspecific reaction to tuberculin skin test without active tuberculosis: Secondary | ICD-10-CM | POA: Diagnosis present

## 2018-07-16 LAB — CBC WITH DIFFERENTIAL/PLATELET
Abs Immature Granulocytes: 0.02 10*3/uL (ref 0.00–0.07)
Basophils Absolute: 0 10*3/uL (ref 0.0–0.1)
Basophils Relative: 0 %
Eosinophils Absolute: 0 10*3/uL (ref 0.0–0.5)
Eosinophils Relative: 1 %
HCT: 35 % — ABNORMAL LOW (ref 39.0–52.0)
Hemoglobin: 11.5 g/dL — ABNORMAL LOW (ref 13.0–17.0)
Immature Granulocytes: 0 %
Lymphocytes Relative: 21 %
Lymphs Abs: 1.1 10*3/uL (ref 0.7–4.0)
MCH: 26.2 pg (ref 26.0–34.0)
MCHC: 32.9 g/dL (ref 30.0–36.0)
MCV: 79.7 fL — ABNORMAL LOW (ref 80.0–100.0)
Monocytes Absolute: 0.9 10*3/uL (ref 0.1–1.0)
Monocytes Relative: 16 %
Neutro Abs: 3.3 10*3/uL (ref 1.7–7.7)
Neutrophils Relative %: 62 %
Platelets: 175 10*3/uL (ref 150–400)
RBC: 4.39 MIL/uL (ref 4.22–5.81)
RDW: 12.5 % (ref 11.5–15.5)
WBC: 5.4 10*3/uL (ref 4.0–10.5)
nRBC: 0 % (ref 0.0–0.2)

## 2018-07-16 LAB — BASIC METABOLIC PANEL
Anion gap: 8 (ref 5–15)
BUN: 28 mg/dL — ABNORMAL HIGH (ref 8–23)
CO2: 20 mmol/L — ABNORMAL LOW (ref 22–32)
Calcium: 7.8 mg/dL — ABNORMAL LOW (ref 8.9–10.3)
Chloride: 103 mmol/L (ref 98–111)
Creatinine, Ser: 1.76 mg/dL — ABNORMAL HIGH (ref 0.61–1.24)
GFR calc Af Amer: 42 mL/min — ABNORMAL LOW (ref >60)
GFR calc non Af Amer: 36 mL/min — ABNORMAL LOW (ref >60)
Glucose, Bld: 211 mg/dL — ABNORMAL HIGH (ref 70–99)
Potassium: 4.2 mmol/L (ref 3.5–5.1)
Sodium: 131 mmol/L — ABNORMAL LOW (ref 135–145)

## 2018-07-16 NOTE — ED Notes (Signed)
Bed: PZ96 Expected date:  Expected time:  Means of arrival:  Comments: COVID

## 2018-07-16 NOTE — ED Notes (Signed)
X-ray at bedside

## 2018-07-16 NOTE — ED Provider Notes (Signed)
[facility] DEPT Provider Note: Georgena Spurling, MD, FACEP  CSN: [id number] MRN: [id number] ARRIVAL: 07/16/18 at 2139 ROOM: WA18/WA18   CHIEF COMPLAINT  Dizziness and Fever   HISTORY OF PRESENT ILLNESS  07/16/18 11:09 PM [name] is a 78 y.o. male who was admitted on May 30 of this year after a syncopal episode along with chest pain and shortness of breath which were transient.  He is COVID-19 positive.  He was discharged home the next day after a reassuring work-up.  He returns with increasing lethargy, dizziness and fever.  He had a near syncopal episode at home earlier today.  EMS reports a temperature of 102 prior to arrival and he was given a gram of Tylenol by EMS.  His temperature on arrival is 99.3.  His oxygen saturation was noted to be as low as 90 % on room air with respirations of 22.   Past Medical History:  Diagnosis Date  . Coronary artery disease    balloon angioplasty of diagonal branch of LAD in 2005  . DM2 (diabetes mellitus, type 2) ([place])   . Dyslipidemia   . GERD (gastroesophageal reflux disease)   . History of nuclear stress test 10/21/2009   dipyridamole; mod perfusion defect due to infarct/scar with mild periinfarct ischemia in mid ant/apical ant/mid anterolateral/apical lateral; no significant ischemia   . Hx of pulmonary embolus   . Hyperglycemia 03/31/2018  . Hypertension   . Ischemic cardiomyopathy    h/o  . MI (myocardial infarction) ([place]) 07/09/2003   anterior   . Mixed hyperlipidemia 03/31/2018  . OA (osteoarthritis)   . 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 Unstable angina ([place]) 05/11/2017    Past Surgical History:  Procedure Laterality Date  . CARDIAC CATHETERIZATION  07/08/2003   2.25x20 [place] balloon angioplasty of diagonal 2 branch of LAD (Dr. Lowella Dell)  . CARDIAC CATHETERIZATION  03/25/2004   diagonal vessel patent w/40% restenosis, 50-60% mid LAD stenosis (Dr. Jackie Plum)  . CARDIAC CATHETERIZATION  07/23/2006   no significant change from 2006 cath  (Dr. Domenic Moras)   . INTRAVASCULAR PRESSURE WIRE/FFR STUDY N/A 05/11/2017   Procedure: INTRAVASCULAR PRESSURE WIRE/FFR STUDY;  Surgeon: Nigel Mormon, MD;  Location: [facility];  Service: Cardiovascular;  Laterality: N/A;  . LEFT HEART CATH AND CORONARY ANGIOGRAPHY N/A 05/11/2017   Procedure: LEFT HEART CATH AND CORONARY ANGIOGRAPHY;  Surgeon: Nigel Mormon, MD;  Location: [facility];  Service: Cardiovascular;  Laterality: N/A;  . TRANSTHORACIC ECHOCARDIOGRAM  05/24/2011   EF 55-60%, mild LVH, grade 1 diastolic dysfunction; AV sclerosis; normal CVP    History reviewed. No pertinent family history.  Social History   Tobacco Use  . Smoking status: Former Smoker    Last attempt to quit: 12/28/1994    Years since quitting: 23.5  . Smokeless tobacco: Never Used  Substance Use Topics  . Alcohol use: No  . Drug use: No    Prior to Admission medications   Medication Sig Start Date End Date Taking? Authorizing Provider  amLODipine (NORVASC) 5 MG tablet TAKE 1 TABLET ONCE A DAY AT BREAKFAST Patient taking differently: Take 5 mg by mouth daily.  04/22/18  Yes Adrian Prows, MD  aspirin 81 MG chewable tablet Chew 1 tablet (81 mg total) by mouth daily. 05/12/17  Yes Patwardhan, Manish J, MD  colchicine 0.6 MG tablet Take 0.6 mg by mouth continuous as needed. Take 0.6 mg by mouth every eight hours until acute gout  flare subsides 02/23/17  Yes [provider]  glimepiride (AMARYL) 1 MG tablet Take 1 tablet by mouth daily. 02/05/18  Yes [provider]  glucosamine-chondroitin 500-400 MG tablet Take 1 tablet by mouth daily.    Yes [provider]  isosorbide mononitrate (IMDUR) 60 MG 24 hr tablet Take 60 mg by mouth daily.   Yes [provider]  labetalol (NORMODYNE) 200 MG tablet Take 1 tablet (200 mg total) by mouth 2 (two) times daily. 04/18/18  Yes Adrian Prows, MD  losartan (COZAAR) 100 MG tablet TAKE 1 TABLET ONCE A DAY AT DINNER Patient  taking differently: Take 100 mg by mouth daily with supper.  04/22/18  Yes Adrian Prows, MD  Multiple Vitamins-Minerals (CENTRUM SILVER 50+MEN) TABS Take 1 tablet by mouth daily.   Yes [provider]  nitroGLYCERIN (NITROSTAT) 0.4 MG SL tablet Place 0.4 mg under the tongue every 5 (five) minutes as needed for chest pain.    Yes [provider]  ondansetron (ZOFRAN) 8 MG tablet Take 8 mg by mouth every 8 (eight) hours as needed for nausea or vomiting.    Yes [provider]  pantoprazole (PROTONIX) 40 MG tablet Take 40 mg by mouth daily.   Yes [provider]  rosuvastatin (CRESTOR) 40 MG tablet Take 20 mg by mouth daily.    Yes [provider]    Allergies Ace inhibitors; Lipitor [atorvastatin calcium]; Simvastatin; and Advicor [niacin-lovastatin er]   REVIEW OF SYSTEMS  Negative except as noted here or in the History of Present Illness.   PHYSICAL EXAMINATION  Initial Vital Signs Blood pressure 119/64, pulse 73, temperature 99.2 F (37.3 C), temperature source Oral, resp. rate (!) 22, SpO2 93 %.  Examination General: Well-developed, well-nourished male in no acute distress; appearance consistent with age of record HENT: normocephalic; atraumatic Eyes: Normal appearance Neck: supple Heart: regular rate and rhythm Lungs: No respiratory effort and excursion; no increased work of breathing Abdomen: soft; nondistended Extremities: No deformity; full range of motion Neurologic: Awake, alert; motor function intact in all extremities and symmetric; no facial droop Skin: Warm and dry Psychiatric: Normal mood and affect   RESULTS  Summary of this visit's results, reviewed by myself:   EKG Interpretation  Date/Time:    Ventricular Rate:    PR Interval:    QRS Duration:   QT Interval:    QTC Calculation:   R Axis:     Text Interpretation:        Laboratory Studies: Results for orders placed or performed during the hospital encounter  of 07/16/18 (from the past 24 hour(s))  CBC with Differential/Platelet     Status: Abnormal   Collection Time: 07/16/18 11:06 PM  Result Value Ref Range   WBC 5.4 4.0 - 10.5 K/uL   RBC 4.39 4.22 - 5.81 MIL/uL   Hemoglobin 11.5 (L) 13.0 - 17.0 g/dL   HCT 35.0 (L) 39.0 - 52.0 %   MCV 79.7 (L) 80.0 - 100.0 fL   MCH 26.2 26.0 - 34.0 pg   MCHC 32.9 30.0 - 36.0 g/dL   RDW 12.5 11.5 - 15.5 %   Platelets 175 150 - 400 K/uL   nRBC 0.0 0.0 - 0.2 %   Neutrophils Relative % 62 %   Neutro Abs 3.3 1.7 - 7.7 K/uL   Lymphocytes Relative 21 %   Lymphs Abs 1.1 0.7 - 4.0 K/uL   Monocytes Relative 16 %   Monocytes Absolute 0.9 0.1 - 1.0 K/uL  Eosinophils Relative 1 %   Eosinophils Absolute 0.0 0.0 - 0.5 K/uL   Basophils Relative 0 %   Basophils Absolute 0.0 0.0 - 0.1 K/uL   Immature Granulocytes 0 %   Abs Immature Granulocytes 0.02 0.00 - 0.07 K/uL  Basic metabolic panel     Status: Abnormal   Collection Time: 07/16/18 11:06 PM  Result Value Ref Range   Sodium 131 (L) 135 - 145 mmol/L   Potassium 4.2 3.5 - 5.1 mmol/L   Chloride 103 98 - 111 mmol/L   CO2 20 (L) 22 - 32 mmol/L   Glucose, Bld 211 (H) 70 - 99 mg/dL   BUN 28 (H) 8 - 23 mg/dL   Creatinine, Ser 1.76 (H) 0.61 - 1.24 mg/dL   Calcium 7.8 (L) 8.9 - 10.3 mg/dL   GFR calc non Af Amer 36 (L) >60 mL/min   GFR calc Af Amer 42 (L) >60 mL/min   Anion gap 8 5 - 15  Urinalysis, Routine w reflex microscopic     Status: None   Collection Time: 07/17/18 12:44 AM  Result Value Ref Range   Color, Urine YELLOW YELLOW   APPearance CLEAR CLEAR   Specific Gravity, Urine 1.012 1.005 - 1.030   pH 6.0 5.0 - 8.0   Glucose, UA NEGATIVE NEGATIVE mg/dL   Hgb urine dipstick NEGATIVE NEGATIVE   Bilirubin Urine NEGATIVE NEGATIVE   Ketones, ur NEGATIVE NEGATIVE mg/dL   Protein, ur NEGATIVE NEGATIVE mg/dL   Nitrite NEGATIVE NEGATIVE   Leukocytes,Ua NEGATIVE NEGATIVE   Imaging Studies: Dg Chest Port 1 View  Result Date: 07/17/2018 CLINICAL DATA:  36  28-year-old male with positive COVID and shortness of breath. EXAM: PORTABLE CHEST 1 VIEW COMPARISON:  Chest radiograph dated 07/13/2018 FINDINGS: Hazy areas of increased density predominantly involving the mid to lower lung fields bilaterally and new or progressed since the prior radiograph. These findings are nonspecific and may represent atelectasis or infiltrate or related toCOVID. Clinical correlation is recommended. There is a small left pleural effusion. No pneumothorax. Stable cardiac silhouette. No acute osseous pathology. IMPRESSION: 1. Bilateral mid to lower lung field densities, new or progressed since the prior radiograph. 2. Small left pleural effusion. Electronically Signed   By: Anner Crete M.D.   On: 07/17/2018 00:38    ED COURSE and MDM  Nursing notes and initial vitals signs, including pulse oximetry, reviewed.  Vitals:   07/17/18 0000 07/17/18 0016 07/17/18 0200 07/17/18 0300  BP: (!) 107/59  123/66 124/74  Pulse: 65  63 62  Resp: (!) 21  19 19   Temp:      TempSrc:      SpO2: 92%  92% 92%  Weight:  72.6 kg    Height:  5\' 6"  (1.676 m)     Will have patient admitted for worsening COVID-19 viral infection.  PROCEDURES    ED DIAGNOSES     ICD-10-CM   1. COVID-19 virus infection U07.1        Skiler Olden, MD 07/17/18 [phone]

## 2018-07-16 NOTE — ED Triage Notes (Signed)
Per EMS, patient coming from home with complaints of lethargy, dizziness and fever. Patient had a near syncopal episode earlier today and his daughter was concerned. He is positive for covid-19.   BP 138/62 HR 74 RR 18 SpO2 95% CBG 169  Temperature 102.0   Given 1000 mg of Tylenol by EMS   There is a language barrier and his daughter translates. Her phone number is [phone].

## 2018-07-17 DIAGNOSIS — R55 Syncope and collapse: Secondary | ICD-10-CM

## 2018-07-17 DIAGNOSIS — Z7982 Long term (current) use of aspirin: Secondary | ICD-10-CM | POA: Diagnosis not present

## 2018-07-17 DIAGNOSIS — Z888 Allergy status to other drugs, medicaments and biological substances status: Secondary | ICD-10-CM | POA: Diagnosis not present

## 2018-07-17 DIAGNOSIS — Z86711 Personal history of pulmonary embolism: Secondary | ICD-10-CM | POA: Diagnosis not present

## 2018-07-17 DIAGNOSIS — N183 Chronic kidney disease, stage 3 (moderate): Secondary | ICD-10-CM | POA: Diagnosis present

## 2018-07-17 DIAGNOSIS — U071 COVID-19: Principal | ICD-10-CM

## 2018-07-17 DIAGNOSIS — Z79899 Other long term (current) drug therapy: Secondary | ICD-10-CM | POA: Diagnosis not present

## 2018-07-17 DIAGNOSIS — J189 Pneumonia, unspecified organism: Secondary | ICD-10-CM | POA: Diagnosis not present

## 2018-07-17 DIAGNOSIS — Z9861 Coronary angioplasty status: Secondary | ICD-10-CM | POA: Diagnosis not present

## 2018-07-17 DIAGNOSIS — I25119 Atherosclerotic heart disease of native coronary artery with unspecified angina pectoris: Secondary | ICD-10-CM | POA: Diagnosis not present

## 2018-07-17 DIAGNOSIS — R7611 Nonspecific reaction to tuberculin skin test without active tuberculosis: Secondary | ICD-10-CM | POA: Diagnosis not present

## 2018-07-17 DIAGNOSIS — Z87891 Personal history of nicotine dependence: Secondary | ICD-10-CM | POA: Diagnosis not present

## 2018-07-17 DIAGNOSIS — I255 Ischemic cardiomyopathy: Secondary | ICD-10-CM | POA: Diagnosis not present

## 2018-07-17 DIAGNOSIS — E782 Mixed hyperlipidemia: Secondary | ICD-10-CM | POA: Diagnosis not present

## 2018-07-17 DIAGNOSIS — D649 Anemia, unspecified: Secondary | ICD-10-CM | POA: Insufficient documentation

## 2018-07-17 DIAGNOSIS — M1990 Unspecified osteoarthritis, unspecified site: Secondary | ICD-10-CM | POA: Diagnosis not present

## 2018-07-17 DIAGNOSIS — J1289 Other viral pneumonia: Secondary | ICD-10-CM | POA: Diagnosis not present

## 2018-07-17 DIAGNOSIS — K219 Gastro-esophageal reflux disease without esophagitis: Secondary | ICD-10-CM | POA: Diagnosis not present

## 2018-07-17 DIAGNOSIS — N179 Acute kidney failure, unspecified: Secondary | ICD-10-CM | POA: Diagnosis not present

## 2018-07-17 DIAGNOSIS — E1122 Type 2 diabetes mellitus with diabetic chronic kidney disease: Secondary | ICD-10-CM | POA: Diagnosis not present

## 2018-07-17 DIAGNOSIS — I129 Hypertensive chronic kidney disease with stage 1 through stage 4 chronic kidney disease, or unspecified chronic kidney disease: Secondary | ICD-10-CM | POA: Diagnosis not present

## 2018-07-17 DIAGNOSIS — I252 Old myocardial infarction: Secondary | ICD-10-CM | POA: Diagnosis not present

## 2018-07-17 DIAGNOSIS — E860 Dehydration: Secondary | ICD-10-CM | POA: Diagnosis not present

## 2018-07-17 LAB — URINALYSIS, ROUTINE W REFLEX MICROSCOPIC
Bilirubin Urine: NEGATIVE
Glucose, UA: NEGATIVE mg/dL
Hgb urine dipstick: NEGATIVE
Ketones, ur: NEGATIVE mg/dL
Leukocytes,Ua: NEGATIVE
Nitrite: NEGATIVE
Protein, ur: NEGATIVE mg/dL
Specific Gravity, Urine: 1.012 (ref 1.005–1.030)
pH: 6 (ref 5.0–8.0)

## 2018-07-17 LAB — LACTATE DEHYDROGENASE: LDH: 221 U/L — ABNORMAL HIGH (ref 98–192)

## 2018-07-17 LAB — GLUCOSE, CAPILLARY
Glucose-Capillary: 99 mg/dL (ref 70–99)
Glucose-Capillary: 169 mg/dL — ABNORMAL HIGH (ref 70–99)
Glucose-Capillary: 191 mg/dL — ABNORMAL HIGH (ref 70–99)
Glucose-Capillary: 275 mg/dL — ABNORMAL HIGH (ref 70–99)

## 2018-07-17 LAB — D-DIMER, QUANTITATIVE: D-Dimer, Quant: 1 ug/mL-FEU — ABNORMAL HIGH (ref 0.00–0.50)

## 2018-07-17 LAB — TROPONIN I: Troponin I: <0.03 ng/mL (ref <0.03)

## 2018-07-17 LAB — SEDIMENTATION RATE: Sed Rate: 70 mm/hr — ABNORMAL HIGH (ref 0–16)

## 2018-07-17 LAB — FIBRINOGEN: Fibrinogen: 601 mg/dL — ABNORMAL HIGH (ref 210–475)

## 2018-07-17 LAB — FERRITIN: Ferritin: 242 ng/mL (ref 24–336)

## 2018-07-17 LAB — HEMOGLOBIN A1C
Hgb A1c MFr Bld: 6.2 % — ABNORMAL HIGH (ref 4.8–5.6)
Mean Plasma Glucose: 131.24 mg/dL

## 2018-07-17 LAB — PROCALCITONIN: Procalcitonin: 0.2 ng/mL

## 2018-07-17 LAB — C-REACTIVE PROTEIN: CRP: 8.3 mg/dL — ABNORMAL HIGH (ref <1.0)

## 2018-07-17 MED ORDER — LABETALOL HCL 200 MG PO TABS
200.0000 mg | ORAL_TABLET | Freq: Two times a day (BID) | ORAL | Status: DC
  Administered 2018-07-17 – 2018-07-18 (×3): 200 mg via ORAL
  Filled 2018-07-17 (×6): qty 1

## 2018-07-17 MED ORDER — INSULIN ASPART 100 UNIT/ML ~~LOC~~ SOLN
0.0000 [IU] | Freq: Every day | SUBCUTANEOUS | Status: DC
  Administered 2018-07-17: 3 [IU] via SUBCUTANEOUS

## 2018-07-17 MED ORDER — ENOXAPARIN SODIUM 40 MG/0.4ML ~~LOC~~ SOLN
40.0000 mg | SUBCUTANEOUS | Status: DC
  Administered 2018-07-17 – 2018-07-18 (×2): 40 mg via SUBCUTANEOUS
  Filled 2018-07-17 (×3): qty 0.4

## 2018-07-17 MED ORDER — ACETAMINOPHEN 325 MG PO TABS: 650.0000 mg | ORAL_TABLET | Freq: Four times a day (QID) | ORAL | Status: DC | PRN

## 2018-07-17 MED ORDER — INSULIN ASPART 100 UNIT/ML ~~LOC~~ SOLN
0.0000 [IU] | Freq: Three times a day (TID) | SUBCUTANEOUS | Status: DC
  Administered 2018-07-17: 4 [IU] via SUBCUTANEOUS
  Administered 2018-07-18: 11 [IU] via SUBCUTANEOUS
  Administered 2018-07-18: 7 [IU] via SUBCUTANEOUS
  Administered 2018-07-18: 11 [IU] via SUBCUTANEOUS

## 2018-07-17 MED ORDER — ZINC SULFATE 220 (50 ZN) MG PO CAPS
220.0000 mg | ORAL_CAPSULE | Freq: Every day | ORAL | Status: DC
  Administered 2018-07-17 – 2018-07-18 (×2): 220 mg via ORAL
  Filled 2018-07-17 (×3): qty 1

## 2018-07-17 MED ORDER — PANTOPRAZOLE SODIUM 40 MG PO TBEC
40.0000 mg | DELAYED_RELEASE_TABLET | Freq: Every day | ORAL | Status: DC
  Administered 2018-07-17 – 2018-07-18 (×2): 40 mg via ORAL
  Filled 2018-07-17 (×2): qty 1

## 2018-07-17 MED ORDER — INSULIN ASPART 100 UNIT/ML ~~LOC~~ SOLN: 0.0000 [IU] | Freq: Every day | SUBCUTANEOUS | Status: DC

## 2018-07-17 MED ORDER — ASPIRIN 81 MG PO CHEW
81.0000 mg | CHEWABLE_TABLET | Freq: Every day | ORAL | Status: DC
  Administered 2018-07-17 – 2018-07-18 (×2): 81 mg via ORAL
  Filled 2018-07-17 (×2): qty 1

## 2018-07-17 MED ORDER — ISOSORBIDE MONONITRATE ER 60 MG PO TB24
60.0000 mg | ORAL_TABLET | Freq: Every day | ORAL | Status: DC
  Administered 2018-07-17 – 2018-07-18 (×2): 60 mg via ORAL
  Filled 2018-07-17 (×4): qty 1

## 2018-07-17 MED ORDER — HYDROCOD POLST-CPM POLST ER 10-8 MG/5ML PO SUER: 5.0000 mL | Freq: Two times a day (BID) | ORAL | Status: DC | PRN

## 2018-07-17 MED ORDER — ROSUVASTATIN CALCIUM 5 MG PO TABS
20.0000 mg | ORAL_TABLET | Freq: Every day | ORAL | Status: DC
  Administered 2018-07-17 – 2018-07-18 (×2): 20 mg via ORAL
  Filled 2018-07-17: qty 1
  Filled 2018-07-17 (×2): qty 4

## 2018-07-17 MED ORDER — INSULIN ASPART 100 UNIT/ML ~~LOC~~ SOLN
0.0000 [IU] | Freq: Three times a day (TID) | SUBCUTANEOUS | Status: DC
  Administered 2018-07-17: 2 [IU] via SUBCUTANEOUS

## 2018-07-17 MED ORDER — METHYLPREDNISOLONE SODIUM SUCC 125 MG IJ SOLR
60.0000 mg | Freq: Two times a day (BID) | INTRAMUSCULAR | Status: DC
  Administered 2018-07-17 – 2018-07-18 (×3): 60 mg via INTRAVENOUS
  Filled 2018-07-17 (×3): qty 2

## 2018-07-17 MED ORDER — GLUCOSAMINE-CHONDROITIN 500-400 MG PO TABS: 1.0000 | ORAL_TABLET | Freq: Every day | ORAL | Status: DC

## 2018-07-17 MED ORDER — VITAMIN C 500 MG PO TABS
500.0000 mg | ORAL_TABLET | Freq: Two times a day (BID) | ORAL | Status: DC
  Administered 2018-07-17 – 2018-07-18 (×3): 500 mg via ORAL
  Filled 2018-07-17 (×4): qty 1

## 2018-07-17 MED ORDER — GUAIFENESIN-DM 100-10 MG/5ML PO SYRP
10.0000 mL | ORAL_SOLUTION | ORAL | Status: DC | PRN
  Administered 2018-07-17: 10 mL via ORAL
  Filled 2018-07-17: qty 10

## 2018-07-17 NOTE — H&P (Signed)
History and Physical    [name] SWN:[id number] DOB: 1940/05/18 DOA: 07/16/2018  PCP: [name], MD Patient coming from: Home  Chief Complaint: Fever  HPI: [name] is a 78 y.o. male with medical history significant of CAD, CKD 3, type 2 diabetes, hyperlipidemia, GERD, history of PE, hypertension presenting to the hospital via EMS for evaluation of fever, lethargy, and dizziness.  Per EMS, family reported that patient had a near syncopal episode earlier today and that he is positive for COVID-19.  Temperature 102 F per EMS and patient was given 1000 mg Tylenol.  Patient was admitted to the hospital on May 30 for evaluation of syncope.  He was COVID-19 positive.  He was discharged home the next day after reassuring work-up.  Patient speaks [place]. No family available at this time to provide history.  I called [facility] interpreter services customer service who informed me that they do not have any interpreters for this language.  ED Course: Afebrile on arrival to the ED.  Slightly tachypneic.  SPO2 91 to 93% on room air.  No leukocytosis.  No lymphopenia.  Hemoglobin 11.5, baseline in the 11-12 range.  BUN 28.  Creatinine 1.7, baseline 1.3-1.5.  UA not suggestive of infection.  Chest x-ray (personally reviewed) showing bilateral mid to lower lung field densities which appear to have progressed since chest radiograph done on Jul 13, 2018.  Also showing a small left pleural effusion.  Review of Systems:  All systems reviewed and apart from history of presenting illness, are negative.  Past Medical History:  Diagnosis Date  . Coronary artery disease    balloon angioplasty of diagonal branch of LAD in 2005  . DM2 (diabetes mellitus, type 2) ([place])   . Dyslipidemia   . GERD (gastroesophageal reflux disease)   . History of nuclear stress test 10/21/2009   dipyridamole; mod perfusion defect due to infarct/scar with mild periinfarct ischemia in mid ant/apical ant/mid anterolateral/apical  lateral; no significant ischemia   . Hx of pulmonary embolus   . Hyperglycemia 03/31/2018  . Hypertension   . Ischemic cardiomyopathy    h/o  . MI (myocardial infarction) ([place]) 07/09/2003   anterior   . Mixed hyperlipidemia 03/31/2018  . OA (osteoarthritis)   . 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 Unstable angina ([place]) 05/11/2017    Past Surgical History:  Procedure Laterality Date  . CARDIAC CATHETERIZATION  07/08/2003   2.25x20 [place] balloon angioplasty of diagonal 2 branch of LAD (Dr. Lowella Dell)  . CARDIAC CATHETERIZATION  03/25/2004   diagonal vessel patent w/40% restenosis, 50-60% mid LAD stenosis (Dr. Jackie Plum)  . CARDIAC CATHETERIZATION  07/23/2006   no significant change from 2006 cath (Dr. Domenic Moras)   . INTRAVASCULAR PRESSURE WIRE/FFR STUDY N/A 05/11/2017   Procedure: INTRAVASCULAR PRESSURE WIRE/FFR STUDY;  Surgeon: Nigel Mormon, MD;  Location: [facility];  Service: Cardiovascular;  Laterality: N/A;  . LEFT HEART CATH AND CORONARY ANGIOGRAPHY N/A 05/11/2017   Procedure: LEFT HEART CATH AND CORONARY ANGIOGRAPHY;  Surgeon: Nigel Mormon, MD;  Location: [facility];  Service: Cardiovascular;  Laterality: N/A;  . TRANSTHORACIC ECHOCARDIOGRAM  05/24/2011   EF 55-60%, mild LVH, grade 1 diastolic dysfunction; AV sclerosis; normal CVP     reports that he quit smoking about 23 years ago. He has never used smokeless tobacco. He reports that he does not drink alcohol or use drugs.  Allergies  Allergen Reactions  . Ace Inhibitors Cough  . Lipitor [employer]  Calcium] Other (See Comments)    Muscle aches  . Simvastatin Other (See Comments)    Muscle aches  . Advicor [Niacin-Lovastatin Er] Rash    History reviewed. No pertinent family history.  Prior to Admission medications   Medication Sig Start Date End Date Taking? Authorizing Provider  amLODipine (NORVASC) 5 MG tablet TAKE 1 TABLET ONCE A DAY AT BREAKFAST Patient taking differently: Take 5 mg by mouth  daily.  04/22/18  Yes Adrian Prows, MD  aspirin 81 MG chewable tablet Chew 1 tablet (81 mg total) by mouth daily. 05/12/17  Yes Patwardhan, Manish J, MD  colchicine 0.6 MG tablet Take 0.6 mg by mouth continuous as needed. Take 0.6 mg by mouth every eight hours until acute gout flare subsides 02/23/17  Yes [provider]  glimepiride (AMARYL) 1 MG tablet Take 1 tablet by mouth daily. 02/05/18  Yes [provider]  glucosamine-chondroitin 500-400 MG tablet Take 1 tablet by mouth daily.    Yes [provider]  isosorbide mononitrate (IMDUR) 60 MG 24 hr tablet Take 60 mg by mouth daily.   Yes [provider]  labetalol (NORMODYNE) 200 MG tablet Take 1 tablet (200 mg total) by mouth 2 (two) times daily. 04/18/18  Yes Adrian Prows, MD  losartan (COZAAR) 100 MG tablet TAKE 1 TABLET ONCE A DAY AT DINNER Patient taking differently: Take 100 mg by mouth daily with supper.  04/22/18  Yes Adrian Prows, MD  Multiple Vitamins-Minerals (CENTRUM SILVER 50+MEN) TABS Take 1 tablet by mouth daily.   Yes [provider]  nitroGLYCERIN (NITROSTAT) 0.4 MG SL tablet Place 0.4 mg under the tongue every 5 (five) minutes as needed for chest pain.    Yes [provider]  ondansetron (ZOFRAN) 8 MG tablet Take 8 mg by mouth every 8 (eight) hours as needed for nausea or vomiting.    Yes [provider]  pantoprazole (PROTONIX) 40 MG tablet Take 40 mg by mouth daily.   Yes [provider]  rosuvastatin (CRESTOR) 40 MG tablet Take 20 mg by mouth daily.    Yes [provider]    Physical Exam: Vitals:   07/17/18 0200 07/17/18 0300 07/17/18 0430 07/17/18 0530  BP: 123/66 124/74 133/79 135/71  Pulse: 63 62 65 65  Resp: 19 19 14 20   Temp:      TempSrc:      SpO2: 92% 92% 92% 90%  Weight:      Height:        Physical Exam  Constitutional: He appears well-developed and well-nourished. No distress.  Resting comfortably in a stretcher  HENT:  Head:  Normocephalic.  Mouth/Throat: Oropharynx is clear and moist.  Eyes: Right eye exhibits no discharge. Left eye exhibits no discharge.  Neck: Neck supple.  Cardiovascular: Normal rate, regular rhythm and intact distal pulses.  Pulmonary/Chest: Effort normal. No respiratory distress. He has no wheezes. He has rales.  SPO2 92% on room air No increased work of breathing Bibasilar rales  Abdominal: Soft. Bowel sounds are normal. He exhibits no distension. There is no abdominal tenderness. There is no guarding.  Musculoskeletal:        General: No edema.  Neurological:  Awake and alert  Skin: Skin is warm and dry. He is not diaphoretic.     Labs on Admission: I have personally reviewed following labs and imaging studies  CBC: Recent Labs  Lab 07/13/18 1611 07/14/18 0653 07/16/18 2306  WBC 5.3 4.6 5.4  NEUTROABS 3.2 2.4 3.3  HGB 11.8* 12.5* 11.5*  HCT 36.4* 37.6* 35.0*  MCV 80.0 78.7* 79.7*  PLT 138* 151 248   Basic Metabolic Panel: Recent Labs  Lab 07/13/18 1611 07/14/18 0653 07/16/18 2306  NA 135 136 131*  K 4.3 4.3 4.2  CL 103 100 103  CO2 20* 22 20*  GLUCOSE 208* 94 211*  BUN 19 17 28*  CREATININE 1.91* 1.55* 1.76*  CALCIUM 8.4* 8.9 7.8*   GFR: Estimated Creatinine Clearance: 30.7 mL/min (A) (by C-G formula based on SCr of 1.76 mg/dL (H)). Liver Function Tests: Recent Labs  Lab 07/13/18 1611 07/14/18 0653  AST 32 34  ALT 33 35  ALKPHOS 94 108  BILITOT 0.6 0.7  PROT 7.1 7.5  ALBUMIN 3.6 3.6   No results for input(s): LIPASE, AMYLASE in the last 168 hours. No results for input(s): AMMONIA in the last 168 hours. Coagulation Profile: No results for input(s): INR, PROTIME in the last 168 hours. Cardiac Enzymes: Recent Labs  Lab 07/13/18 1611 07/13/18 2059 07/14/18 0231 07/14/18 0653  TROPONINI <0.03 <0.03 <0.03 <0.03   BNP (last 3 results) No results for input(s): PROBNP in the last 8760 hours. HbA1C: No results for input(s): HGBA1C in the last 72  hours. CBG: Recent Labs  Lab 07/13/18 1700 07/14/18 0152 07/14/18 0851 07/14/18 1209  GLUCAP 164* 89 95 140*   Lipid Profile: No results for input(s): CHOL, HDL, LDLCALC, TRIG, CHOLHDL, LDLDIRECT in the last 72 hours. Thyroid Function Tests: No results for input(s): TSH, T4TOTAL, FREET4, T3FREE, THYROIDAB in the last 72 hours. Anemia Panel: Recent Labs    07/14/18 0653 07/16/18 2259  FERRITIN 119 242   Urine analysis:    Component Value Date/Time   COLORURINE YELLOW 07/17/2018 0044   APPEARANCEUR CLEAR 07/17/2018 0044   LABSPEC 1.012 07/17/2018 0044   PHURINE 6.0 07/17/2018 0044   GLUCOSEU NEGATIVE 07/17/2018 0044   HGBUR NEGATIVE 07/17/2018 0044   BILIRUBINUR NEGATIVE 07/17/2018 0044   KETONESUR NEGATIVE 07/17/2018 0044   PROTEINUR NEGATIVE 07/17/2018 0044   UROBILINOGEN 0.2 05/24/2011 0236   NITRITE NEGATIVE 07/17/2018 0044   LEUKOCYTESUR NEGATIVE 07/17/2018 0044    Radiological Exams on Admission: Dg Chest Port 1 View  Result Date: 07/17/2018 CLINICAL DATA:  [age]-year-old male with positive COVID and shortness of breath. EXAM: PORTABLE CHEST 1 VIEW COMPARISON:  Chest radiograph dated 07/13/2018 FINDINGS: Hazy areas of increased density predominantly involving the mid to lower lung fields bilaterally and new or progressed since the prior radiograph. These findings are nonspecific and may represent atelectasis or infiltrate or related toCOVID. Clinical correlation is recommended. There is a small left pleural effusion. No pneumothorax. Stable cardiac silhouette. No acute osseous pathology. IMPRESSION: 1. Bilateral mid to lower lung field densities, new or progressed since the prior radiograph. 2. Small left pleural effusion. Electronically Signed   By: Anner Crete M.D.   On: 07/17/2018 00:38    EKG: Independently reviewed.  Sinus rhythm, no significant change since prior tracing.  Assessment/Plan Principal Problem:   Multifocal pneumonia Active Problems:    CAD (coronary artery disease)   COVID-19 virus infection   CKD (chronic kidney disease), stage III (HCC)   Near syncope   Multifocal pneumonia secondary to COVID-19 viral infection Tested positive for SARS-CoV-2 on Jul 13, 2018.  Temperature 102 F per EMS.  SPO2 91 to 93% on room air.  Currently no increased work of breathing and not tachypneic.  No leukocytosis.  No lymphopenia. Chest x-ray (personally reviewed) showing bilateral mid to  lower lung field densities which appear to have progressed since chest radiograph done on Jul 13, 2018.  Although currently not requiring supplemental oxygen, patient is at high risk for decompensation given imaging findings. -Vitamin C 500 mg twice daily -Zinc 220 mg daily -Do not give NSAIDs -Check ferritin, fibrinogen, d-dimer, LDH, ESR, CRP, procalcitonin, troponin -Droplet and contact precautions -Continuous pulse ox -Supplemental oxygen as needed -Tylenol PRN -Antitussives: Robitussin-DM PRN, Tussionex as needed  Near syncope Likely secondary to acute viral illness.  Reported by family to EMS.  Not hypotensive. -Cardiac monitoring -Check orthostatics -No family available at this time.  This needs to be discussed further with the family in the morning.  Chronic anemia -Stable.  Hemoglobin 11.5, baseline in the 11-12 range.    CKD 3 BUN 28.  Creatinine 1.7, baseline 1.3-1.5.   -Encourage oral hydration -Continue to monitor BMP  Type 2 diabetes CBG 211. -Check A1c.  Sliding scale insulin sensitive with bedtime coverage.  CBG checks.  Hypertension -Continue home Imdur and labetalol  GERD -Continue PPI  Hyperlipidemia -Continue home Crestor  DVT prophylaxis: Lovenox Code Status: Full code Family Communication: No family available at this time. Disposition Plan: Anticipate discharge after clinical improvement. Consults called: None Admission status: It is my clinical opinion that admission to [facility] is reasonable and necessary in  this 78 y.o. male . presenting with multifocal pneumonia secondary to COVID-19 viral infection. . with pertinent positives on physical exam including: Fever . and pertinent positives on radiographic and laboratory data including: Imaging with worsening airspace densities.  Recent lab test for COVID-19 positive. . Given imaging findings, patient is at high risk for decompensation and may require oxygen supplementation.  Given the aforementioned, the predictability of an adverse outcome is felt to be significant. I expect that the patient will require at least 2 midnights in the hospital to treat this condition.   The medical decision making on this patient was of high complexity and the patient is at high risk for clinical deterioration, therefore this is a level 3 visit.  Shela Leff MD [facility] Pager [phone]  If 7PM-7AM, please contact night-coverage [email or web address] Password [email or web address]  07/17/2018, 5:46 AM

## 2018-07-17 NOTE — ED Notes (Signed)
Patient transported to [facility] via [facility]. Patient A&O x4 and stable upon transport.

## 2018-07-17 NOTE — ED Notes (Signed)
Patient uses daughter as interpreter. Claiborne Billings is the daughter and her number is [phone].

## 2018-07-17 NOTE — ED Notes (Signed)
Urine culture sent down to lab with urinalysis. 

## 2018-07-17 NOTE — TOC Initial Note (Signed)
Transition of Care [facility]) - Initial/Assessment Note    Patient Details  Name: [name] MRN: [id number] Date of Birth: Jan 16, 1941  Transition of Care [facility]) CM/SW Contact:    Purcell Mouton, RN Phone Number: 07/17/2018, 1:06 PM  Clinical Narrative:     Pt from home admitted 6/2 positive with COVID.               Expected Discharge Plan: Home/Self Care Barriers to Discharge: (Speak [place])   Patient Goals and CMS Choice        Expected Discharge Plan and Services Expected Discharge Plan: Home/Self Care       Living arrangements for the past 2 months: Single Family Home                                      Prior Living Arrangements/Services Living arrangements for the past 2 months: Single Family Home Lives with:: Relatives Patient language and need for interpreter reviewed:: Yes(Speak [place])                 Activities of Daily Living Home Assistive Devices/Equipment: Eyeglasses(at home) ADL Screening (condition at time of admission) Patient's cognitive ability adequate to safely complete daily activities?: Yes Is the patient deaf or have difficulty hearing?: No Does the patient have difficulty seeing, even when wearing glasses/contacts?: No Does the patient have difficulty concentrating, remembering, or making decisions?: No Patient able to express need for assistance with ADLs?: Yes Does the patient have difficulty dressing or bathing?: No Independently performs ADLs?: Yes (appropriate for developmental age) Does the patient have difficulty walking or climbing stairs?: No Weakness of Legs: None Weakness of Arms/Hands: None  Permission Sought/Granted                  Emotional Assessment              Admission diagnosis:  COVID-19 virus infection [U07.1] Patient Active Problem List   Diagnosis Date Noted  . Multifocal pneumonia 07/17/2018  . Near syncope 07/17/2018  . Chronic anemia 07/17/2018  . CKD (chronic kidney  disease), stage III ([place])   . Syncope 07/13/2018  . COVID-19 virus infection 07/13/2018  . Renal insufficiency 07/13/2018  . Diabetes mellitus type 2, noninsulin dependent ([place]) 07/13/2018  . Mixed hyperlipidemia 03/31/2018  . GERD without esophagitis 03/31/2018  . Angina pectoris ([place]) 05/11/2017  . AKI (acute kidney injury) ([place]) 05/23/2011  . CAD (coronary artery disease) 05/23/2011  . Hypertension 05/23/2011   PCP:  [name], MD Pharmacy:   [facility] [place], [address] [place] [address] Phone: [phone] Fax: [phone]  [facility] - [place], [place] - [address] [place] [address] Phone: [phone] Fax: [phone]     Social Determinants of Health ([place]) Interventions    Readmission Risk Interventions No flowsheet data found.

## 2018-07-17 NOTE — Plan of Care (Signed)
  Problem: Coping: Goal: Psychosocial and spiritual needs will be supported Outcome: Progressing   Problem: Respiratory: Goal: Will maintain a patent airway Outcome: Progressing Goal: Complications related to the disease process, condition or treatment will be avoided or minimized Outcome: Progressing   Problem: Education: Goal: Knowledge of General Education information will improve Description Including pain rating scale, medication(s)/side effects and non-pharmacologic comfort measures Outcome: Progressing   Problem: Clinical Measurements: Goal: Ability to maintain clinical measurements within normal limits will improve Outcome: Progressing   Problem: Nutrition: Goal: Adequate nutrition will be maintained Outcome: Progressing   Problem: Coping: Goal: Level of anxiety will decrease Outcome: Progressing   Problem: Skin Integrity: Goal: Risk for impaired skin integrity will decrease Outcome: Progressing

## 2018-07-17 NOTE — Progress Notes (Signed)
PROGRESS NOTE    Helder Crisafulli  OBS:[id number] DOB: 08-13-1940 DOA: 07/16/2018 PCP: [name], MD    Brief Narrative:  78 year old male with a history of coronary artery disease, chronic kidney disease stage III, diabetes, presents to the hospital with fever, lethargy and dizziness.  He was found to have a temperature of 102 per EMS.  He tested positive for COVID-19.  He was recently in the hospital and discharged on May 30 after evaluation for syncope.  His work-up was unrevealing at that time.  He is readmitted to the hospital with chest x-ray evidence of pneumonia.   Assessment & Plan:   Principal Problem:   Multifocal pneumonia Active Problems:   CAD (coronary artery disease)   COVID-19 virus infection   CKD (chronic kidney disease), stage III (HCC)   Near syncope   1. COVID-19 pneumonia.  Chest x-ray indicates multifocal pneumonia.  He is positive for COVID-19.  He was initially febrile, but since arriving to [facility] has not had any further fevers.  Overall inflammatory markers appear to be rising.  We will start the patient on steroids.  He says his overall breathing is improving, although he still has episodes of shortness of breath.  He is currently on room air.  Continue to follow clinically and inflammatory markers.  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  Would avoid Actemra at this point. 2. Near syncope.  Likely related to volume depletion.  This appears to have improved. 3. Chronic kidney disease stage III.  Creatinine appears to be near baseline.  Recheck in a.m. 4. Hyperlipidemia.  Continue statin 5. Hypertension.  Continue on Imdur and labetalol. 6. GERD.  Continue PPI 7. Diabetes.  Continue on sliding scale insulin.  Monitor blood sugars in the setting of steroids.   DVT prophylaxis: Lovenox Code Status: Full code Family Communication: Discussed with daughter over the phone Disposition Plan: Discharge  home once improved   Consultants:     Procedures:     Antimicrobials:       Subjective: Patient states feeling better today.  He does have occasional shortness of breath.  Overall dizziness is doing better.  Objective: Vitals:   07/17/18 1033 07/17/18 1147 07/17/18 1148 07/17/18 1149  BP: 139/70 124/69 107/62 122/65  Pulse: 71 73 88 78  Resp:  (!) 21 (!) 23 (!) 23  Temp:      TempSrc:      SpO2:  91% 93% 91%  Weight:      Height:        Intake/Output Summary (Last 24 hours) at 07/17/2018 1411 Last data filed at 07/17/2018 1033 Gross per 24 hour  Intake 420 ml  Output 800 ml  Net -380 ml   Filed Weights   07/17/18 0016  Weight: 72.6 kg    Examination:  General exam: Appears calm and comfortable  Respiratory system: Clear to auscultation. Respiratory effort normal. Cardiovascular system: S1 & S2 heard, RRR. No JVD, murmurs, rubs, gallops or clicks. No pedal edema. Gastrointestinal system: Abdomen is nondistended, soft and nontender. No organomegaly or masses felt. Normal bowel sounds heard. Central nervous system: Alert and oriented. No focal neurological deficits. Extremities: Symmetric 5 x 5 power. Skin: No rashes, lesions or ulcers Psychiatry: Judgement and insight appear normal. Mood & affect appropriate.     Data Reviewed: I have personally reviewed following labs and imaging studies  CBC: Recent Labs  Lab 07/13/18 1611 07/14/18 0653 07/16/18 2306  WBC 5.3 4.6 5.4  NEUTROABS 3.2 2.4 3.3  HGB 11.8* 12.5* 11.5*  HCT 36.4* 37.6* 35.0*  MCV 80.0 78.7* 79.7*  PLT 138* 151 751   Basic Metabolic Panel: Recent Labs  Lab 07/13/18 1611 07/14/18 0653 07/16/18 2306  NA 135 136 131*  K 4.3 4.3 4.2  CL 103 100 103  CO2 20* 22 20*  GLUCOSE 208* 94 211*  BUN 19 17 28*  CREATININE 1.91* 1.55* 1.76*  CALCIUM 8.4* 8.9 7.8*   GFR: Estimated Creatinine Clearance: 30.7 mL/min (A) (by C-G formula based on SCr of 1.76 mg/dL (H)). Liver Function Tests:  Recent Labs  Lab 07/13/18 1611 07/14/18 0653  AST 32 34  ALT 33 35  ALKPHOS 94 108  BILITOT 0.6 0.7  PROT 7.1 7.5  ALBUMIN 3.6 3.6   No results for input(s): LIPASE, AMYLASE in the last 168 hours. No results for input(s): AMMONIA in the last 168 hours. Coagulation Profile: No results for input(s): INR, PROTIME in the last 168 hours. Cardiac Enzymes: Recent Labs  Lab 07/13/18 1611 07/13/18 2059 07/14/18 0231 07/14/18 0653 07/17/18 0449  TROPONINI <0.03 <0.03 <0.03 <0.03 <0.03   BNP (last 3 results) No results for input(s): PROBNP in the last 8760 hours. HbA1C: Recent Labs    07/16/18 2258  HGBA1C 6.2*   CBG: Recent Labs  Lab 07/14/18 0152 07/14/18 0851 07/14/18 1209 07/17/18 0810 07/17/18 1145  GLUCAP 89 95 140* 99 169*   Lipid Profile: No results for input(s): CHOL, HDL, LDLCALC, TRIG, CHOLHDL, LDLDIRECT in the last 72 hours. Thyroid Function Tests: No results for input(s): TSH, T4TOTAL, FREET4, T3FREE, THYROIDAB in the last 72 hours. Anemia Panel: Recent Labs    07/16/18 2259  FERRITIN 242   Sepsis Labs: Recent Labs  Lab 07/13/18 1926 07/17/18 0449  PROCALCITON <0.10 0.20    Recent Results (from the past 240 hour(s))  SARS Coronavirus 2 (CEPHEID - Performed in [facility] hospital lab), Hosp Order     Status: Abnormal   Collection Time: 07/13/18  5:09 PM  Result Value Ref Range Status   SARS Coronavirus 2 POSITIVE (A) NEGATIVE Final    Comment: RESULT CALLED TO, READ BACK BY AND VERIFIED WITH: M COFFEY,RN AT 1846 07/13/2018 BY L BENFIELD (NOTE) If result is NEGATIVE SARS-CoV-2 target nucleic acids are NOT DETECTED. The SARS-CoV-2 RNA is generally detectable in upper and lower  respiratory specimens during the acute phase of infection. The lowest  concentration of SARS-CoV-2 viral copies this assay can detect is 250  copies / mL. A negative result does not preclude SARS-CoV-2 infection  and should not be used as the sole basis for treatment  or other  patient management decisions.  A negative result may occur with  improper specimen collection / handling, submission of specimen other  than nasopharyngeal swab, presence of viral mutation(s) within the  areas targeted by this assay, and inadequate number of viral copies  (<250 copies / mL). A negative result must be combined with clinical  observations, patient history, and epidemiological information. If result is POSITIVE SARS-CoV-2 target nucleic acids are DETE CTED. The SARS-CoV-2 RNA is generally detectable in upper and lower  respiratory specimens during the acute phase of infection.  Positive  results are indicative of active infection with SARS-CoV-2.  Clinical  correlation with patient history and other diagnostic information is  necessary to determine patient infection status.  Positive results do  not rule out bacterial infection or co-infection with other viruses. If result is  PRESUMPTIVE POSTIVE SARS-CoV-2 nucleic acids MAY BE PRESENT.   A presumptive positive result was obtained on the submitted specimen  and confirmed on repeat testing.  While 2019 novel coronavirus  (SARS-CoV-2) nucleic acids may be present in the submitted sample  additional confirmatory testing may be necessary for epidemiological  and / or clinical management purposes  to differentiate between  SARS-CoV-2 and other Sarbecovirus currently known to infect humans.  If clinically indicated additional testing with an alternate test  methodology (LAB [phone]) is advised. The SARS-CoV-2 RNA is generally  detectable in upper and lower respiratory specimens during the acute  phase of infection. The expected result is Negative. Fact Sheet for Patients:  StrictlyIdeas.no Fact Sheet for Healthcare Providers: BankingDealers.co.za This test is not yet approved or cleared by the [place] FDA and has been authorized for detection and/or diagnosis of  SARS-CoV-2 by FDA under an Emergency Use Authorization (EUA).  This EUA will remain in effect (meaning this test can be used) for the duration of the COVID-19 declaration under Section 564(b)(1) of the Act, 21 U.S.C. section 360bbb-3(b)(1), unless the authorization is terminated or revoked sooner. Performed at [facility], [place] [address] [place], [place] [address]   MRSA PCR Screening     Status: None   Collection Time: 07/14/18  5:56 AM  Result Value Ref Range Status   MRSA by PCR NEGATIVE NEGATIVE Final    Comment:        The GeneXpert MRSA Assay (FDA approved for NASAL specimens only), is one component of a comprehensive MRSA colonization surveillance program. It is not intended to diagnose MRSA infection nor to guide or monitor treatment for MRSA infections. Performed at [facility], [place] [address] [place], [place] [address]          Radiology Studies: Dg Chest [place] 1 View  Result Date: 07/17/2018 CLINICAL DATA:  21 27-year-old male with positive COVID and shortness of breath. EXAM: PORTABLE CHEST 1 VIEW COMPARISON:  Chest radiograph dated 07/13/2018 FINDINGS: Hazy areas of increased density predominantly involving the mid to lower lung fields bilaterally and new or progressed since the prior radiograph. These findings are nonspecific and may represent atelectasis or infiltrate or related toCOVID. Clinical correlation is recommended. There is a small left pleural effusion. No pneumothorax. Stable cardiac silhouette. No acute osseous pathology. IMPRESSION: 1. Bilateral mid to lower lung field densities, new or progressed since the prior radiograph. 2. Small left pleural effusion. Electronically Signed   By: Anner Crete M.D.   On: 07/17/2018 00:38        Scheduled Meds: . aspirin  81 mg Oral Daily  . enoxaparin (LOVENOX) injection  40 mg Subcutaneous Q24H  . insulin aspart  0-20 Units Subcutaneous TID WC  . insulin aspart  0-5 Units Subcutaneous  QHS  . isosorbide mononitrate  60 mg Oral Daily  . labetalol  200 mg Oral BID  . methylPREDNISolone (SOLU-MEDROL) injection  60 mg Intravenous Q12H  . pantoprazole  40 mg Oral Daily  . rosuvastatin  20 mg Oral Daily  . vitamin C  500 mg Oral BID  . zinc sulfate  220 mg Oral Daily   Continuous Infusions:   LOS: 0 days    Time spent: 30 mins    Kathie Dike, MD [facility]   If 7PM-7AM, please contact night-coverage [email or web address]  07/17/2018, 2:11 PM

## 2018-07-17 NOTE — ED Notes (Signed)
[facility] called for transport 

## 2018-07-17 NOTE — Telephone Encounter (Signed)
6/2 pt being readmitted to hospital - texted mychart link

## 2018-07-18 LAB — C-REACTIVE PROTEIN: CRP: 8.9 mg/dL — ABNORMAL HIGH (ref <1.0)

## 2018-07-18 LAB — GLUCOSE, CAPILLARY
Glucose-Capillary: 245 mg/dL — ABNORMAL HIGH (ref 70–99)
Glucose-Capillary: 295 mg/dL — ABNORMAL HIGH (ref 70–99)
Glucose-Capillary: 294 mg/dL — ABNORMAL HIGH (ref 70–99)

## 2018-07-18 LAB — BASIC METABOLIC PANEL
Anion gap: 10 (ref 5–15)
BUN: 22 mg/dL (ref 8–23)
CO2: 21 mmol/L — ABNORMAL LOW (ref 22–32)
Calcium: 9 mg/dL (ref 8.9–10.3)
Chloride: 105 mmol/L (ref 98–111)
Creatinine, Ser: 1.39 mg/dL — ABNORMAL HIGH (ref 0.61–1.24)
GFR calc Af Amer: 55 mL/min — ABNORMAL LOW (ref >60)
GFR calc non Af Amer: 48 mL/min — ABNORMAL LOW (ref >60)
Glucose, Bld: 256 mg/dL — ABNORMAL HIGH (ref 70–99)
Potassium: 4.8 mmol/L (ref 3.5–5.1)
Sodium: 136 mmol/L (ref 135–145)

## 2018-07-18 LAB — FERRITIN: Ferritin: 256 ng/mL (ref 24–336)

## 2018-07-18 LAB — D-DIMER, QUANTITATIVE: D-Dimer, Quant: 0.77 ug/mL-FEU — ABNORMAL HIGH (ref 0.00–0.50)

## 2018-07-18 LAB — LACTATE DEHYDROGENASE: LDH: 211 U/L — ABNORMAL HIGH (ref 98–192)

## 2018-07-18 MED ORDER — PREDNISONE 10 MG PO TABS: ORAL_TABLET | ORAL | 0 refills | Status: DC

## 2018-07-18 MED ORDER — ASCORBIC ACID 500 MG PO TABS: 500.0000 mg | ORAL_TABLET | Freq: Two times a day (BID) | ORAL | 0 refills | Status: DC

## 2018-07-18 MED ORDER — ZINC SULFATE 220 (50 ZN) MG PO CAPS: 220.0000 mg | ORAL_CAPSULE | Freq: Every day | ORAL | 0 refills | Status: DC

## 2018-07-18 NOTE — Discharge Instructions (Signed)
Person Under Monitoring Name: [name]  Location: [address] [place] [address]   Infection Prevention Recommendations for Individuals Confirmed to have, or Being Evaluated for, 2019 Novel Coronavirus (COVID-19) Infection Who Receive Care at Home  Individuals who are confirmed to have, or are being evaluated for, COVID-19 should follow the prevention steps below until a healthcare provider or local or state health department says they can return to normal activities.  Stay home except to get medical care You should restrict activities outside your home, except for getting medical care. Do not go to work, school, or public areas, and do not use public transportation or taxis.  Call ahead before visiting your doctor Before your medical appointment, call the healthcare provider and tell them that you have, or are being evaluated for, COVID-19 infection. This will help the healthcare providers office take steps to keep other people from getting infected. Ask your healthcare provider to call the local or state health department.  Monitor your symptoms Seek prompt medical attention if your illness is worsening (e.g., difficulty breathing). Before going to your medical appointment, call the healthcare provider and tell them that you have, or are being evaluated for, COVID-19 infection. Ask your healthcare provider to call the local or state health department.  Wear a facemask You should wear a facemask that covers your nose and mouth when you are in the same room with other people and when you visit a healthcare provider. People who live with or visit you should also wear a facemask while they are in the same room with you.  Separate yourself from other people in your home As much as possible, you should stay in a different room from other people in your home. Also, you should use a separate bathroom, if available.  Avoid sharing household items You should not share  dishes, drinking glasses, cups, eating utensils, towels, bedding, or other items with other people in your home. After using these items, you should wash them thoroughly with soap and water.  Cover your coughs and sneezes Cover your mouth and nose with a tissue when you cough or sneeze, or you can cough or sneeze into your sleeve. Throw used tissues in a lined trash can, and immediately wash your hands with soap and water for at least 20 seconds or use an alcohol-based hand rub.  Wash your [facility] your hands often and thoroughly with soap and water for at least 20 seconds. You can use an alcohol-based hand sanitizer if soap and water are not available and if your hands are not visibly dirty. Avoid touching your eyes, nose, and mouth with unwashed hands.   Prevention Steps for Caregivers and Household Members of Individuals Confirmed to have, or Being Evaluated for, COVID-19 Infection Being Cared for in the Home  If you live with, or provide care at home for, a person confirmed to have, or being evaluated for, COVID-19 infection please follow these guidelines to prevent infection:  Follow healthcare providers instructions Make sure that you understand and can help the patient follow any healthcare provider instructions for all care.  Provide for the patients basic needs You should help the patient with basic needs in the home and provide support for getting groceries, prescriptions, and other personal needs.  Monitor the patients symptoms If they are getting sicker, call his or her medical provider and tell them that the patient has, or is being evaluated for, COVID-19 infection. This will help the healthcare providers office  take steps to keep other people from getting infected. Ask the healthcare provider to call the local or state health department.  Limit the number of people who have contact with the patient  If possible, have only one caregiver for the patient.  Other  household members should stay in another home or place of residence. If this is not possible, they should stay  in another room, or be separated from the patient as much as possible. Use a separate bathroom, if available.  Restrict visitors who do not have an essential need to be in the home.  Keep older adults, very young children, and other sick people away from the patient Keep older adults, very young children, and those who have compromised immune systems or chronic health conditions away from the patient. This includes people with chronic heart, lung, or kidney conditions, diabetes, and cancer.  Ensure good ventilation Make sure that shared spaces in the home have good air flow, such as from an air conditioner or an opened window, weather permitting.  Wash your hands often  Wash your hands often and thoroughly with soap and water for at least 20 seconds. You can use an alcohol based hand sanitizer if soap and water are not available and if your hands are not visibly dirty.  Avoid touching your eyes, nose, and mouth with unwashed hands.  Use disposable paper towels to dry your hands. If not available, use dedicated cloth towels and replace them when they become wet.  Wear a facemask and gloves  Wear a disposable facemask at all times in the room and gloves when you touch or have contact with the patients blood, body fluids, and/or secretions or excretions, such as sweat, saliva, sputum, nasal mucus, vomit, urine, or feces.  Ensure the mask fits over your nose and mouth tightly, and do not touch it during use.  Throw out disposable facemasks and gloves after using them. Do not reuse.  Wash your hands immediately after removing your facemask and gloves.  If your personal clothing becomes contaminated, carefully remove clothing and launder. Wash your hands after handling contaminated clothing.  Place all used disposable facemasks, gloves, and other waste in a lined container before  disposing them with other household waste.  Remove gloves and wash your hands immediately after handling these items.  Do not share dishes, glasses, or other household items with the patient  Avoid sharing household items. You should not share dishes, drinking glasses, cups, eating utensils, towels, bedding, or other items with a patient who is confirmed to have, or being evaluated for, COVID-19 infection.  After the person uses these items, you should wash them thoroughly with soap and water.  Wash laundry thoroughly  Immediately remove and wash clothes or bedding that have blood, body fluids, and/or secretions or excretions, such as sweat, saliva, sputum, nasal mucus, vomit, urine, or feces, on them.  Wear gloves when handling laundry from the patient.  Read and follow directions on labels of laundry or clothing items and detergent. In general, wash and dry with the warmest temperatures recommended on the label.  Clean all areas the individual has used often  Clean all touchable surfaces, such as counters, tabletops, doorknobs, bathroom fixtures, toilets, phones, keyboards, tablets, and bedside tables, every day. Also, clean any surfaces that may have blood, body fluids, and/or secretions or excretions on them.  Wear gloves when cleaning surfaces the patient has come in contact with.  Use a diluted bleach solution (e.g., dilute bleach with 1 part  bleach and 10 parts water) or a household disinfectant with a label that says EPA-registered for coronaviruses. To make a bleach solution at home, add 1 tablespoon of bleach to 1 quart (4 cups) of water. For a larger supply, add  cup of bleach to 1 gallon (16 cups) of water.  Read labels of cleaning products and follow recommendations provided on product labels. Labels contain instructions for safe and effective use of the cleaning product including precautions you should take when applying the product, such as wearing gloves or eye protection  and making sure you have good ventilation during use of the product.  Remove gloves and wash hands immediately after cleaning.  Monitor yourself for signs and symptoms of illness Caregivers and household members are considered close contacts, should monitor their health, and will be asked to limit movement outside of the home to the extent possible. Follow the monitoring steps for close contacts listed on the symptom monitoring form.   ? If you have additional questions, contact your local health department or call the epidemiologist on call at [phone] (available 24/7). ? This guidance is subject to change. For the most up-to-date guidance from [facility], please refer to their website: YouBlogs.pl

## 2018-07-18 NOTE — Progress Notes (Signed)
Patient being discharged home with family, lives with his daughter.  AVS reviewed with patient and his daughter Hnhue via patient's iphone/ FaceTime. Patient and family deny questions following review of medications, follow up appointments, and recommendations from [facility] regarding COVID-19.  Patient being transported to lobby via wheelchair and transported by family via private vehicle.

## 2018-07-18 NOTE — Progress Notes (Signed)
Inpatient Diabetes Program Recommendations  AACE/ADA: New Consensus Statement on Inpatient Glycemic Control (2015)  Target Ranges:  Prepandial:   less than 140 mg/dL      Peak postprandial:   less than 180 mg/dL (1-2 hours)      Critically ill patients:  140 - 180 mg/dL   Lab Results  Component Value Date   GLUCAP 245 (H) 07/18/2018   HGBA1C 6.2 (H) 07/16/2018    Review of Glycemic Control Results for [name], [name] (MRN [id number]) as of 07/18/2018 11:43  Ref. Range 07/17/2018 08:10 07/17/2018 11:45 07/17/2018 16:20 07/17/2018 22:01 07/18/2018 07:43  Glucose-Capillary Latest Ref Range: 70 - 99 mg/dL 99 169 (H) 191 (H) 275 (H) 245 (H)   Diabetes history: DM 2 Outpatient Diabetes medications:  Amaryl 1 mg daily Current orders for Inpatient glycemic control:  Novolog resistant tid with meals and HS Solumedrol 60 mg IV q 12 hours Inpatient Diabetes Program Recommendations:    May consider adding Levemir 5 units bid while patient is on steroids.   Thanks  Adah Perl, RN, BC-ADM Inpatient Diabetes Coordinator Pager [phone] (8a-5p)

## 2018-07-18 NOTE — Discharge Summary (Signed)
Physician Discharge Summary  [name] WUJ:[id number] DOB: February 23, 1940 DOA: 07/16/2018  PCP: [name], MD  Admit date: 07/16/2018 Discharge date: 07/18/2018  Admitted From: Home Disposition: Home  Recommendations for Outpatient Follow-up:  1. Follow up with PCP in 1-2 weeks 2. Please obtain BMP/CBC in one week 3. Patient has been advised to isolate at home per state board of health guidelines  Discharge Condition: Stable CODE STATUS: Full code Diet recommendation: Heart healthy  Brief/Interim Summary: 78 year old male with history of coronary artery disease, chronic kidney disease stage III, diabetes, presented to the hospital with fever, lethargy and dizziness.  He was found to be febrile with a temperature of 102.  He tested positive for COVID-19.  He was recently discharged from the hospital on 07/13/2018 after evaluation for syncope.  At that time, his work-up has been unrevealing.  The patient was readmitted to the hospital with chest x-ray evidence of pneumonia.  He had a near syncopal episode.  It was noted that he had elevated creatinine and had evidence of dehydration.  He received IV fluids which improved his renal function.  The patient did not have any significant hypoxia.  His chest x-ray did indicate multifocal pneumonia and he was treated with intravenous steroids.  Overall fevers have resolved.  Inflammatory markers have stabilized.  He is currently on room air and able to ambulate without any dyspnea or hypoxia.  His dizziness has resolved with hydration.  Patient is feeling back to his baseline level of functioning and he feels ready for discharge home.  He has been advised to continue isolation at home per  state Board of Health guidelines.  Discharge Diagnoses:  Principal Problem:   Multifocal pneumonia Active Problems:   CAD (coronary artery disease)   COVID-19 virus infection   CKD (chronic kidney disease), stage III ([place])   Near syncope    Discharge  Instructions  Discharge Instructions    Diet - low sodium heart healthy   Complete by:  As directed    Increase activity slowly   Complete by:  As directed      Allergies as of 07/18/2018      Reactions   Ace Inhibitors Cough   Lipitor [atorvastatin Calcium] Other (See Comments)   Muscle aches   Simvastatin Other (See Comments)   Muscle aches   Advicor [niacin-lovastatin Er] Rash      Medication List    STOP taking these medications   losartan 100 MG tablet Commonly known as:  COZAAR     TAKE these medications   amLODipine 5 MG tablet Commonly known as:  NORVASC TAKE 1 TABLET ONCE A DAY AT BREAKFAST What changed:  See the new instructions.   ascorbic acid 500 MG tablet Commonly known as:  VITAMIN C Take 1 tablet (500 mg total) by mouth 2 (two) times daily.   aspirin 81 MG chewable tablet Chew 1 tablet (81 mg total) by mouth daily.   Centrum Silver 50+Men Tabs Take 1 tablet by mouth daily.   colchicine 0.6 MG tablet Take 0.6 mg by mouth continuous as needed. Take 0.6 mg by mouth every eight hours until acute gout flare subsides   glimepiride 1 MG tablet Commonly known as:  AMARYL Take 1 tablet by mouth daily.   glucosamine-chondroitin 500-400 MG tablet Take 1 tablet by mouth daily.   isosorbide mononitrate 60 MG 24 hr tablet Commonly known as:  IMDUR Take 60 mg by mouth daily.   labetalol 200 MG tablet Commonly known as:  NORMODYNE Take 1 tablet (200 mg total) by mouth 2 (two) times daily.   nitroGLYCERIN 0.4 MG SL tablet Commonly known as:  NITROSTAT Place 0.4 mg under the tongue every 5 (five) minutes as needed for chest pain.   ondansetron 8 MG tablet Commonly known as:  ZOFRAN Take 8 mg by mouth every 8 (eight) hours as needed for nausea or vomiting.   pantoprazole 40 MG tablet Commonly known as:  PROTONIX Take 40 mg by mouth daily.   predniSONE 10 MG tablet Commonly known as:  DELTASONE Take 40mg  po daily for 2 days then 30mg  daily for 2  days then 20mg  daily for 2 days then 10mg  daily for 2 days then stop   rosuvastatin 40 MG tablet Commonly known as:  CRESTOR Take 20 mg by mouth daily.   zinc sulfate 220 (50 Zn) MG capsule Take 1 capsule (220 mg total) by mouth daily. Start taking on:  July 19, 2018       Allergies  Allergen Reactions  . Ace Inhibitors Cough  . Lipitor [Atorvastatin Calcium] Other (See Comments)    Muscle aches  . Simvastatin Other (See Comments)    Muscle aches  . Advicor [Niacin-Lovastatin Er] Rash    Consultations:     Procedures/Studies: Nm Pulmonary Perfusion  Result Date: 07/14/2018 CLINICAL DATA:  Concern for pulmonary embolism. Syncope. Covid positive patient. EXAM: NUCLEAR MEDICINE PERFUSION LUNG SCAN TECHNIQUE: Perfusion images were obtained in multiple projections after intravenous injection of radiopharmaceutical. Ventilation portion of exam intentionally omitted if possible during the COVID-19 epidemic. RADIOPHARMACEUTICALS:  1.65 mCi Tc-6m MAA COMPARISON:  Chest radiograph 07/13/2018 FINDINGS: Normal perfusion pattern. No wedge-shaped peripheral perfusion defect. IMPRESSION: No evidence of pulmonary embolism.  Normal perfusion scan. Electronically Signed   By: Suzy Bouchard M.D.   On: 07/14/2018 11:59   Dg Chest Port 1 View  Result Date: 07/17/2018 CLINICAL DATA:  [age]-year-old male with positive COVID and shortness of breath. EXAM: PORTABLE CHEST 1 VIEW COMPARISON:  Chest radiograph dated 07/13/2018 FINDINGS: Hazy areas of increased density predominantly involving the mid to lower lung fields bilaterally and new or progressed since the prior radiograph. These findings are nonspecific and may represent atelectasis or infiltrate or related toCOVID. Clinical correlation is recommended. There is a small left pleural effusion. No pneumothorax. Stable cardiac silhouette. No acute osseous pathology. IMPRESSION: 1. Bilateral mid to lower lung field densities, new or progressed since  the prior radiograph. 2. Small left pleural effusion. Electronically Signed   By: Anner Crete M.D.   On: 07/17/2018 00:38   Dg Chest Portable 1 View  Result Date: 07/13/2018 CLINICAL DATA:  Shortness of breath.  Right-sided chest pain. EXAM: PORTABLE CHEST 1 VIEW COMPARISON:  May 11, 2017 FINDINGS: A small left pleural effusion is identified. Mild opacity seen left base. The heart, hila, and mediastinum are unremarkable. No overt edema. No other acute abnormalities. IMPRESSION: Small left effusion. Mild left basilar opacity could represent atelectasis or early infiltrate. Recommend clinical correlation and follow-up to resolution. Electronically Signed   By: Dorise Bullion III M.D   On: 07/13/2018 16:46       Subjective: Feels better.  No dizziness or shortness of breath.  Discharge Exam: Vitals:   07/18/18 1600 07/18/18 1645 07/18/18 1649 07/18/18 1655  BP: 126/70     Pulse: 70 73 86   Resp: 18 16 19    Temp:    98.4 F (36.9 C)  TempSrc:    Oral  SpO2: 94% 94% 92%  Weight:      Height:        General: Pt is alert, awake, not in acute distress Cardiovascular: RRR, S1/S2 +, no rubs, no gallops Respiratory: CTA bilaterally, no wheezing, no rhonchi Abdominal: Soft, NT, ND, bowel sounds + Extremities: no edema, no cyanosis    The results of significant diagnostics from this hospitalization (including imaging, microbiology, ancillary and laboratory) are listed below for reference.     Microbiology: Recent Results (from the past 240 hour(s))  SARS Coronavirus 2 (CEPHEID - Performed in [place] hospital lab), Hosp Order     Status: Abnormal   Collection Time: 07/13/18  5:09 PM  Result Value Ref Range Status   SARS Coronavirus 2 POSITIVE (A) NEGATIVE Final    Comment: RESULT CALLED TO, READ BACK BY AND VERIFIED WITH: M COFFEY,RN AT 1846 07/13/2018 BY L BENFIELD (NOTE) If result is NEGATIVE SARS-CoV-2 target nucleic acids are NOT DETECTED. The SARS-CoV-2 RNA is  generally detectable in upper and lower  respiratory specimens during the acute phase of infection. The lowest  concentration of SARS-CoV-2 viral copies this assay can detect is 250  copies / mL. A negative result does not preclude SARS-CoV-2 infection  and should not be used as the sole basis for treatment or other  patient management decisions.  A negative result may occur with  improper specimen collection / handling, submission of specimen other  than nasopharyngeal swab, presence of viral mutation(s) within the  areas targeted by this assay, and inadequate number of viral copies  (<250 copies / mL). A negative result must be combined with clinical  observations, patient history, and epidemiological information. If result is POSITIVE SARS-CoV-2 target nucleic acids are DETE CTED. The SARS-CoV-2 RNA is generally detectable in upper and lower  respiratory specimens during the acute phase of infection.  Positive  results are indicative of active infection with SARS-CoV-2.  Clinical  correlation with patient history and other diagnostic information is  necessary to determine patient infection status.  Positive results do  not rule out bacterial infection or co-infection with other viruses. If result is PRESUMPTIVE POSTIVE SARS-CoV-2 nucleic acids MAY BE PRESENT.   A presumptive positive result was obtained on the submitted specimen  and confirmed on repeat testing.  While 2019 novel coronavirus  (SARS-CoV-2) nucleic acids may be present in the submitted sample  additional confirmatory testing may be necessary for epidemiological  and / or clinical management purposes  to differentiate between  SARS-CoV-2 and other Sarbecovirus currently known to infect humans.  If clinically indicated additional testing with an alternate test  methodology (LAB [phone]) is advised. The SARS-CoV-2 RNA is generally  detectable in upper and lower respiratory specimens during the acute  phase of  infection. The expected result is Negative. Fact Sheet for Patients:  StrictlyIdeas.no Fact Sheet for Healthcare Providers: BankingDealers.co.za This test is not yet approved or cleared by the [place] FDA and has been authorized for detection and/or diagnosis of SARS-CoV-2 by FDA under an Emergency Use Authorization (EUA).  This EUA will remain in effect (meaning this test can be used) for the duration of the COVID-19 declaration under Section 564(b)(1) of the Act, 21 U.S.C. section 360bbb-3(b)(1), unless the authorization is terminated or revoked sooner. Performed at [facility], [place] [address] [place], [place] [address]   MRSA PCR Screening     Status: None   Collection Time: 07/14/18  5:56 AM  Result Value Ref Range Status   MRSA by PCR NEGATIVE  NEGATIVE Final    Comment:        The GeneXpert MRSA Assay (FDA approved for NASAL specimens only), is one component of a comprehensive MRSA colonization surveillance program. It is not intended to diagnose MRSA infection nor to guide or monitor treatment for MRSA infections. Performed at [facility], [place] [address], [place], [place] [address]      Labs: BNP (last 3 results) No results for input(s): BNP in the last 8760 hours. Basic Metabolic Panel: Recent Labs  Lab 07/13/18 1611 07/14/18 0653 07/16/18 2306 07/18/18 0500  NA 135 136 131* 136  K 4.3 4.3 4.2 4.8  CL 103 100 103 105  CO2 20* 22 20* 21*  GLUCOSE 208* 94 211* 256*  BUN 19 17 28* 22  CREATININE 1.91* 1.55* 1.76* 1.39*  CALCIUM 8.4* 8.9 7.8* 9.0   Liver Function Tests: Recent Labs  Lab 07/13/18 1611 07/14/18 0653  AST 32 34  ALT 33 35  ALKPHOS 94 108  BILITOT 0.6 0.7  PROT 7.1 7.5  ALBUMIN 3.6 3.6   No results for input(s): LIPASE, AMYLASE in the last 168 hours. No results for input(s): AMMONIA in the last 168 hours. CBC: Recent Labs  Lab 07/13/18 1611 07/14/18 0653  07/16/18 2306  WBC 5.3 4.6 5.4  NEUTROABS 3.2 2.4 3.3  HGB 11.8* 12.5* 11.5*  HCT 36.4* 37.6* 35.0*  MCV 80.0 78.7* 79.7*  PLT 138* 151 175   Cardiac Enzymes: Recent Labs  Lab 07/13/18 1611 07/13/18 2059 07/14/18 0231 07/14/18 0653 07/17/18 0449  TROPONINI <0.03 <0.03 <0.03 <0.03 <0.03   BNP: Invalid input(s): POCBNP CBG: Recent Labs  Lab 07/17/18 1145 07/17/18 1620 07/17/18 2201 07/18/18 0743 07/18/18 1151  GLUCAP 169* 191* 275* 245* 295*   D-Dimer Recent Labs    07/17/18 0449 07/18/18 0500  DDIMER 1.00* 0.77*   Hgb A1c Recent Labs    07/16/18 2258  HGBA1C 6.2*   Lipid Profile No results for input(s): CHOL, HDL, LDLCALC, TRIG, CHOLHDL, LDLDIRECT in the last 72 hours. Thyroid function studies No results for input(s): TSH, T4TOTAL, T3FREE, THYROIDAB in the last 72 hours.  Invalid input(s): FREET3 Anemia work up Recent Labs    07/16/18 2259 07/18/18 0500  FERRITIN 242 256   Urinalysis    Component Value Date/Time   COLORURINE YELLOW 07/17/2018 [place] 07/17/2018 0044   LABSPEC 1.012 07/17/2018 0044   PHURINE 6.0 07/17/2018 0044   GLUCOSEU NEGATIVE 07/17/2018 0044   HGBUR NEGATIVE 07/17/2018 0044   BILIRUBINUR NEGATIVE 07/17/2018 0044   KETONESUR NEGATIVE 07/17/2018 0044   PROTEINUR NEGATIVE 07/17/2018 0044   UROBILINOGEN 0.2 05/24/2011 0236   NITRITE NEGATIVE 07/17/2018 0044   LEUKOCYTESUR NEGATIVE 07/17/2018 0044   Sepsis Labs Invalid input(s): PROCALCITONIN,  WBC,  LACTICIDVEN Microbiology Recent Results (from the past 240 hour(s))  SARS Coronavirus 2 (CEPHEID - Performed in [place] hospital lab), Hosp Order     Status: Abnormal   Collection Time: 07/13/18  5:09 PM  Result Value Ref Range Status   SARS Coronavirus 2 POSITIVE (A) NEGATIVE Final    Comment: RESULT CALLED TO, READ BACK BY AND VERIFIED WITH: M COFFEY,RN AT 1846 07/13/2018 BY L BENFIELD (NOTE) If result is NEGATIVE SARS-CoV-2 target nucleic acids are  NOT DETECTED. The SARS-CoV-2 RNA is generally detectable in upper and lower  respiratory specimens during the acute phase of infection. The lowest  concentration of SARS-CoV-2 viral copies this assay can detect is 250  copies / mL.  A negative result does not preclude SARS-CoV-2 infection  and should not be used as the sole basis for treatment or other  patient management decisions.  A negative result may occur with  improper specimen collection / handling, submission of specimen other  than nasopharyngeal swab, presence of viral mutation(s) within the  areas targeted by this assay, and inadequate number of viral copies  (<250 copies / mL). A negative result must be combined with clinical  observations, patient history, and epidemiological information. If result is POSITIVE SARS-CoV-2 target nucleic acids are DETE CTED. The SARS-CoV-2 RNA is generally detectable in upper and lower  respiratory specimens during the acute phase of infection.  Positive  results are indicative of active infection with SARS-CoV-2.  Clinical  correlation with patient history and other diagnostic information is  necessary to determine patient infection status.  Positive results do  not rule out bacterial infection or co-infection with other viruses. If result is PRESUMPTIVE POSTIVE SARS-CoV-2 nucleic acids MAY BE PRESENT.   A presumptive positive result was obtained on the submitted specimen  and confirmed on repeat testing.  While 2019 novel coronavirus  (SARS-CoV-2) nucleic acids may be present in the submitted sample  additional confirmatory testing may be necessary for epidemiological  and / or clinical management purposes  to differentiate between  SARS-CoV-2 and other Sarbecovirus currently known to infect humans.  If clinically indicated additional testing with an alternate test  methodology (LAB [phone]) is advised. The SARS-CoV-2 RNA is generally  detectable in upper and lower respiratory specimens  during the acute  phase of infection. The expected result is Negative. Fact Sheet for Patients:  StrictlyIdeas.no Fact Sheet for Healthcare Providers: BankingDealers.co.za This test is not yet approved or cleared by the [place] FDA and has been authorized for detection and/or diagnosis of SARS-CoV-2 by FDA under an Emergency Use Authorization (EUA).  This EUA will remain in effect (meaning this test can be used) for the duration of the COVID-19 declaration under Section 564(b)(1) of the Act, 21 U.S.C. section 360bbb-3(b)(1), unless the authorization is terminated or revoked sooner. Performed at [facility], [place] [address], [place], [place] [address]   MRSA PCR Screening     Status: None   Collection Time: 07/14/18  5:56 AM  Result Value Ref Range Status   MRSA by PCR NEGATIVE NEGATIVE Final    Comment:        The GeneXpert MRSA Assay (FDA approved for NASAL specimens only), is one component of a comprehensive MRSA colonization surveillance program. It is not intended to diagnose MRSA infection nor to guide or monitor treatment for MRSA infections. Performed at [facility], [place] [address] [place], [place] [address]      Time coordinating discharge: 39mins  SIGNED:   Kathie Dike, MD  [facility] 07/18/2018, 5:03 PM   If 7PM-7AM, please contact night-coverage [email or web address]

## 2018-07-18 NOTE — Progress Notes (Signed)
Around 2200, ask patient to call his daughter, [name], for interpretation and to give update. We stayed on phone for questions, assessment and giving medications. [name] really appreciated the update and the call.

## 2018-07-18 NOTE — Progress Notes (Signed)
At rest in bed, Spo2 94% on room air.  Patient successfully able to walk ~263ft around nurses station without shortness of breath or any reported dyspnea.  With activity he maintained Spo2 >92% on room air.     07/18/18 1649  Vitals  Pulse Rate 86  ECG Heart Rate 86  Cardiac Rhythm NSR  Resp 19  Oxygen Therapy  SpO2 92 %  O2 Device Room Air  Patient Activity (if Appropriate) Ambulating (215ft around nurses station)  Pulse Oximetry Type Continuous  MEWS Score  MEWS RR 0  MEWS Pulse 0  MEWS Systolic 0  MEWS LOC 0  MEWS Temp 0  MEWS Score 0  MEWS Score Color Green

## 2018-08-20 ENCOUNTER — Other Ambulatory Visit: Payer: Self-pay | Admitting: Cardiology

## 2018-10-25 ENCOUNTER — Other Ambulatory Visit: Payer: Self-pay | Admitting: Cardiology

## 2018-10-28 ENCOUNTER — Ambulatory Visit: Payer: Medicare Other | Admitting: Cardiology

## 2018-12-20 ENCOUNTER — Other Ambulatory Visit: Payer: Self-pay

## 2018-12-20 DIAGNOSIS — I10 Essential (primary) hypertension: Secondary | ICD-10-CM

## 2018-12-20 DIAGNOSIS — I25118 Atherosclerotic heart disease of native coronary artery with other forms of angina pectoris: Secondary | ICD-10-CM

## 2018-12-20 MED ORDER — NITROGLYCERIN 0.4 MG SL SUBL: 0.4000 mg | SUBLINGUAL_TABLET | SUBLINGUAL | 2 refills | Status: DC | PRN

## 2018-12-20 MED ORDER — LABETALOL HCL 200 MG PO TABS: 200.0000 mg | ORAL_TABLET | Freq: Two times a day (BID) | ORAL | 2 refills | Status: DC

## 2018-12-20 MED ORDER — AMLODIPINE BESYLATE 5 MG PO TABS: ORAL_TABLET | ORAL | 3 refills | Status: DC

## 2019-01-24 ENCOUNTER — Encounter: Payer: Self-pay | Admitting: Cardiology

## 2019-01-24 ENCOUNTER — Ambulatory Visit: Payer: Medicare Other | Admitting: Cardiology

## 2019-01-24 ENCOUNTER — Other Ambulatory Visit: Payer: Self-pay

## 2019-01-24 VITALS — BP 122/64 | HR 65 | Ht 64.0 in | Wt 165.0 lb

## 2019-01-24 DIAGNOSIS — I25118 Atherosclerotic heart disease of native coronary artery with other forms of angina pectoris: Secondary | ICD-10-CM | POA: Diagnosis not present

## 2019-01-24 DIAGNOSIS — I10 Essential (primary) hypertension: Secondary | ICD-10-CM

## 2019-01-24 DIAGNOSIS — E782 Mixed hyperlipidemia: Secondary | ICD-10-CM | POA: Diagnosis not present

## 2019-01-24 MED ORDER — NITROGLYCERIN 0.4 MG SL SUBL: 0.4000 mg | SUBLINGUAL_TABLET | SUBLINGUAL | 2 refills | Status: DC | PRN

## 2019-01-24 NOTE — Progress Notes (Signed)
Primary Physician/Referring:  [name], MD  Patient ID: [name], male    DOB: 07-16-1940, 78 y.o.   MRN: [id number]  Chief Complaint  Patient presents with  . Coronary Artery Disease  . Hypertension   HPI:    [name]  is an 78 y.o. [place] male with coronary artery disease status post PTCA to diagonal 2 for acute MI in 2005, hypertension, hyperlipidemia, diabetes mellitus who  angiography on underwent03/29/2019, showed mild proximal RCA disease, moderate disease in true LAD and circumflex with negative FFR. It was felt the chest pain was likely due to esophageal spasm or GERD.  This is a 80-monthoffice visit.  He is presently doing well and has not had recurrence of angina pectoris. Is presently doing well, essentially remains asymptomatic.  Past Medical History:  Diagnosis Date  . Coronary artery disease    balloon angioplasty of diagonal branch of LAD in 2005  . DM2 (diabetes mellitus, type 2) ([place]   . Dyslipidemia   . GERD (gastroesophageal reflux disease)   . History of nuclear stress test 10/21/2009   dipyridamole; mod perfusion defect due to infarct/scar with mild periinfarct ischemia in mid ant/apical ant/mid anterolateral/apical lateral; no significant ischemia   . Hx of pulmonary embolus   . Hyperglycemia 03/31/2018  . Hypertension   . Ischemic cardiomyopathy    h/o  . [place]   anterior   . Mixed hyperlipidemia 03/31/2018  . OA (osteoarthritis)   . 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 [place] 05/[place]   Past Surgical History:  Procedure Laterality Date  . CARDIAC CATHETERIZATION  07/08/2003   2.25x20 Maverick balloon angioplasty of diagonal 2 branch of LAD (Dr. TLowella Dell  . CARDIAC CATHETERIZATION  03/25/2004   diagonal vessel patent w/40% restenosis, 50-60% mid LAD stenosis (Dr. JJackie Plum  . CARDIAC CATHETERIZATION  07/23/2006   no significant change from 2006 cath (Dr. WDomenic Moras   . INTRAVASCULAR PRESSURE  WIRE/FFR STUDY N/A 05/11/2017   Procedure: INTRAVASCULAR PRESSURE WIRE/FFR STUDY;  Surgeon: PNigel Mormon MD;  [facility];  Service: Cardiovascular;  Laterality: N/A;  . LEFT HEART CATH AND CORONARY ANGIOGRAPHY N/A 05/11/2017   Procedure: LEFT HEART CATH AND CORONARY ANGIOGRAPHY;  Surgeon: PNigel Mormon MD;  [facility];  Service: Cardiovascular;  Laterality: N/A;  . TRANSTHORACIC ECHOCARDIOGRAM  05/24/2011   EF 55-60%, mild LVH, grade 1 diastolic dysfunction; AV sclerosis; normal CVP   Social History   Socioeconomic History  . Marital status: Married    Spouse name: Not on file  . Number of children: 6  . Years of education: Not on file  . Highest education level: Not on file  Occupational History    Employer: [employer]  Tobacco Use  . Smoking status: Former Smoker    Quit date: 12/28/1994    Years since quitting: 24.0  . Smokeless tobacco: Never Used  Substance and Sexual Activity  . Alcohol use: No  . Drug use: No  . Sexual activity: Not on file  Other Topics Concern  . Not on file  Social History Narrative  . Not on file   Social Determinants of Health   Financial Resource Strain:   . Difficulty of Paying Living Expenses: Not on file  Food Insecurity:   . Worried About RCharity fundraiserin the Last Year: Not on file  . Ran Out of Food in the Last Year: Not  on file  Transportation Needs:   . Lack of Transportation (Medical): Not on file  . Lack of Transportation (Non-Medical): Not on file  Physical Activity:   . Days of Exercise per Week: Not on file  . Minutes of Exercise per Session: Not on file  Stress:   . Feeling of Stress : Not on file  Social Connections:   . Frequency of Communication with Friends and Family: Not on file  . Frequency of Social Gatherings with Friends and Family: Not on file  . Attends Religious Services: Not on file  . Active Member of Clubs or Organizations: Not on file  . Attends English as a second language teacher Meetings: Not on file  . Marital Status: Not on file  Intimate Partner Violence:   . Fear of Current or Ex-Partner: Not on file  . Emotionally Abused: Not on file  . Physically Abused: Not on file  . Sexually Abused: Not on file   ROS  Review of Systems  Constitution: Negative for chills, decreased appetite, malaise/fatigue and weight gain.  Cardiovascular: Negative for dyspnea on exertion, leg swelling and syncope.  Endocrine: Negative for cold intolerance.  Hematologic/Lymphatic: Does not bruise/bleed easily.  Musculoskeletal: Negative for joint swelling.  Gastrointestinal: Negative for abdominal pain, anorexia, change in bowel habit, hematochezia and melena.  Neurological: Negative for headaches and light-headedness.  Psychiatric/Behavioral: Negative for depression and substance abuse.  All other systems reviewed and are negative.  Objective  Blood pressure (!) 144/76, pulse 65, height 5' 4"  (1.626 m), weight 165 lb (74.8 kg), SpO2 98 %.  Vitals with BMI 01/24/2019 07/18/2018 07/18/2018  Height 5' 4"  - -  Weight 165 lbs - -  BMI 41.66 - -  Systolic 063 - -  Diastolic 76 - -  Pulse 65 86 73      Physical Exam  Constitutional:  Moderately built and well-nourished in no acute distress.  Appears younger than 78 stated age.  HENT:  Head: Atraumatic.  Eyes: Conjunctivae are normal.  Neck: No JVD present. No thyromegaly present.  Cardiovascular: Normal rate, regular rhythm, normal heart sounds and intact distal pulses. Exam reveals no gallop.  No murmur heard. No leg edema, no JVD.  Pulmonary/Chest: Effort normal and breath sounds normal.  Abdominal: Soft. Bowel sounds are normal.  Musculoskeletal:        General: Normal range of motion.     Cervical back: Neck supple.  Neurological: He is alert.  Skin: Skin is warm and dry.  Psychiatric: He has a normal mood and affect.   Laboratory examination:    Recent Labs    07/14/18 0653 07/16/18 2306 07/18/18 0500   NA 136 131* 136  K 4.3 4.2 4.8  CL 100 103 105  CO2 22 20* 21*  GLUCOSE 94 211* 256*  BUN 17 28* 22  CREATININE 1.55* 1.76* 1.39*  CALCIUM 8.9 7.8* 9.0  GFRNONAA 42* 36* 48*  GFRAA 49* 42* 55*   CrCl cannot be calculated (Patient's most recent lab result is older than the maximum 21 days allowed.).  CMP Latest Ref Rng & Units 07/18/2018 07/16/2018 07/14/2018  Glucose 70 - 99 mg/dL 256(H) 211(H) 94  BUN 8 - 23 mg/dL 22 28(H) 17  Creatinine 0.61 - 1.24 mg/dL 1.39(H) 1.76(H) 1.55(H)  Sodium 135 - 145 mmol/L 136 131(L) 136  Potassium 3.5 - 5.1 mmol/L 4.8 4.2 4.3  Chloride 98 - 111 mmol/L 105 103 100  CO2 22 - 32 mmol/L 21(L) 20(L) 22  Calcium 8.9 - 10.3 mg/dL  9.0 7.8(L) 8.9  Total Protein 6.5 - 8.1 g/dL - - 7.5  Total Bilirubin 0.3 - 1.2 mg/dL - - 0.7  Alkaline Phos 38 - 126 U/L - - 108  AST 15 - 41 U/L - - 34  ALT 0 - 44 U/L - - 35   CBC Latest Ref Rng & Units 07/16/2018 07/14/2018 07/13/2018  WBC 4.0 - 10.5 K/uL 5.4 4.6 5.3  Hemoglobin 13.0 - 17.0 g/dL 11.5(L) 12.5(L) 11.8(L)  Hematocrit 39.0 - 52.0 % 35.0(L) 37.6(L) 36.4(L)  Platelets 150 - 400 K/uL 175 151 138(L)   Lipid Panel     Component Value Date/Time   CHOL  07/21/2006 0200    186        ATP III CLASSIFICATION:  <200     mg/dL   Desirable  200-239  mg/dL   Borderline High  >=240    mg/dL   High   TRIG 168 (H) 07/21/2006 0200   HDL 26 (L) 07/21/2006 0200   CHOLHDL 7.2 07/21/2006 0200   VLDL 34 07/21/2006 0200   LDLCALC (H) 07/21/2006 0200    126        Total Cholesterol/HDL:CHD Risk Coronary Heart Disease Risk Table                     Men   Women  1/2 Average Risk   3.4   3.3   HEMOGLOBIN A1C Lab Results  Component Value Date   HGBA1C 6.2 (H) 07/16/2018   MPG 131.24 07/16/2018   TSH No results for input(s): TSH in the last 8760 hours.   02/01/2018: Hemoglobin A1c 7.6%. Creatinine 1.42, EGFR 48/58, potassium 4.3, CMP otherwise normal. Cholesterol 165, triglycerides 144, HDL 47, LDL 89.  Medications and  allergies   Allergies  Allergen Reactions  . Ace Inhibitors Cough  . Lipitor [Atorvastatin Calcium] Other (See Comments)    Muscle aches  . Simvastatin Other (See Comments)    Muscle aches  . Advicor [Niacin-Lovastatin Er] Rash     Current Outpatient Medications  Medication Instructions  . amLODipine (NORVASC) 5 MG tablet TAKE 1 TABLET BY MOUTH EVERY DAY AT BREAKFAST FOR BLOOD PRESSURE  . ascorbic acid (VITAMIN C) 500 mg, Oral, 2 times daily  . aspirin 81 mg, Oral, Daily  . colchicine 0.6 mg, Oral, Continuous PRN, Take 0.6 mg by mouth every eight hours until acute gout flare subsides  . glimepiride (AMARYL) 1 MG tablet 1 tablet, Oral, Daily, On hold  . glucosamine-chondroitin 500-400 MG tablet 1 tablet, Oral, Daily  . isosorbide mononitrate (IMDUR) 60 mg, Oral, Daily  . labetalol (NORMODYNE) 200 mg, Oral, 2 times daily  . Multiple Vitamins-Minerals (CENTRUM SILVER 50+MEN) TABS 1 tablet, Oral, Daily  . nitroGLYCERIN (NITROSTAT) 0.4 mg, Sublingual, Every 5 min PRN  . ondansetron (ZOFRAN) 8 mg, Oral, Every 8 hours PRN  . pantoprazole (PROTONIX) 40 mg, Oral, Daily  . rosuvastatin (CRESTOR) 20 mg, Oral, Daily    Radiology:   Chest X-Ray 01/07/2014: Normal chest x-ray.  Mild cardiomegaly.  No active cardiopulmonary disease.  Cardiac Studies:   Nuclear Stress Test, exercise myoview 10/18/2012: 1. Resting EKG showed normal sinus rhythm, poor R wave progression. Stress EKG was negative for ischemia. Occasional PVC noted in recovery. Patient exercised on Bruce protocol for 7 minutes. The maximum work level achieved was 7.8 MET's. Hypertensive at rest and stress. The test was terminated due to achievement of the target heart rate and fatigue.  2. Perfusion images reveal scar in  anterior, antero apical and apical lateral wall. There is no significant difference in comparison of stress and rest images on polar display. Dynamic gated images reveal anterior wall hypokinesis and endocardial  thickening. Left ventricular ejection fraction was estimated to be 33%. This is a low risk study.  Coronary angiogram 05/11/2017: Moderate nonobstructive coronary artery disease. No true left main. Ostial 50%, mid 30-40% stenoses. FFR 0.83. Medium sized diagonal with diffuse 60% disease Ostial LCx 40% with resting Pd/Pa 0.98. Mid 20-30% disease Prox RCA 40% stenosis. Normal LVEDP, LVEF 55-65%.  Echocardiogram 01/25/2018: Left ventricle cavity is normal in size. Mild concentric hypertrophy of the left ventricle. Normal global wall motion. Doppler evidence of grade II (pseudonormal) diastolic dysfunction. Diastolic dysfunction findings suggests elevated LA/LV end diastolic pressure. Calculated EF 55%. Trace tricuspid regurgitation. Mild to moderate pulmonic regurgitation. Compared to 01/16/2013, no significant change. Diastolic dysfunction was grade I.  Assessment     ICD-10-CM   1. Coronary artery disease of native artery of native heart with stable angina pectoris ([place])  I25.118 EKG 12-Lead  2. Essential hypertension  I10   3. Mixed hyperlipidemia  E78.2     EKG 01/24/2019: Normal sinus rhythm at rate of 61 bpm, normal axis.  Poor R-wave progression, probably normal variant.  No evidence of ischemia, nonspecific high lateral T abnormality. No significant change from  EKG 01/04/2018: Sinus bradycardia at rate of 56 bpm, normal axis, LVH with repolarization abnormality, cannot exclude lateral ischemia.  Recommendations:  No orders of the defined types were placed in this encounter.   [name]  is a 78 y.o.  [place] male with coronary artery disease status post PTCA to diagonal 2 for acute MI in 2005, hypertension, hyperlipidemia, diabetes mellitus who  angiography on underwent03/29/2019, showed mild proximal RCA disease, moderate disease in true LAD and circumflex with negative FFR. It was felt the chest pain was likely due to esophageal spasm or GERD.   He has not had any recurrence of chest  pain, presently doing well and essentially remains asymptomatic.  No change in his physical exam.  Blood pressure is also well controlled.  Lipids are at goal, he has recently had blood work done for this year and I'll try to obtain the copy of the same.  Otherwise he is stable from cardiac standpoint, I have refilled his nitroglycerin, I'll see him back in one year.  Adrian Prows, MD, [facility] 01/24/2019, 2:57 PM [facility] [place] Pager: [phone] Office: [phone] If no answer Cell [phone]

## 2019-04-26 ENCOUNTER — Ambulatory Visit: Payer: Medicare Other

## 2019-04-28 ENCOUNTER — Ambulatory Visit: Payer: Medicare Other

## 2019-10-07 ENCOUNTER — Other Ambulatory Visit: Payer: Self-pay | Admitting: Cardiology

## 2019-10-07 DIAGNOSIS — I10 Essential (primary) hypertension: Secondary | ICD-10-CM

## 2019-10-07 DIAGNOSIS — I25118 Atherosclerotic heart disease of native coronary artery with other forms of angina pectoris: Secondary | ICD-10-CM

## 2020-01-14 ENCOUNTER — Other Ambulatory Visit: Payer: Self-pay

## 2020-01-14 DIAGNOSIS — I25118 Atherosclerotic heart disease of native coronary artery with other forms of angina pectoris: Secondary | ICD-10-CM

## 2020-01-14 MED ORDER — AMLODIPINE BESYLATE 5 MG PO TABS: ORAL_TABLET | ORAL | 0 refills | Status: DC

## 2020-01-26 ENCOUNTER — Other Ambulatory Visit: Payer: Self-pay

## 2020-01-26 ENCOUNTER — Ambulatory Visit: Payer: Medicare Other | Admitting: Cardiology

## 2020-01-26 ENCOUNTER — Encounter: Payer: Self-pay | Admitting: Cardiology

## 2020-01-26 VITALS — BP 150/83 | HR 55 | Resp 16 | Ht 64.0 in | Wt 175.0 lb

## 2020-01-26 DIAGNOSIS — E782 Mixed hyperlipidemia: Secondary | ICD-10-CM

## 2020-01-26 DIAGNOSIS — I25118 Atherosclerotic heart disease of native coronary artery with other forms of angina pectoris: Secondary | ICD-10-CM

## 2020-01-26 DIAGNOSIS — R739 Hyperglycemia, unspecified: Secondary | ICD-10-CM

## 2020-01-26 DIAGNOSIS — N1832 Chronic kidney disease, stage 3b: Secondary | ICD-10-CM

## 2020-01-26 DIAGNOSIS — R0609 Other forms of dyspnea: Secondary | ICD-10-CM

## 2020-01-26 DIAGNOSIS — I10 Essential (primary) hypertension: Secondary | ICD-10-CM

## 2020-01-26 MED ORDER — AMLODIPINE BESYLATE 10 MG PO TABS: ORAL_TABLET | ORAL | 3 refills | Status: DC

## 2020-01-26 NOTE — Progress Notes (Signed)
Primary Physician/Referring:  [name], MD  Patient ID: [name], male    DOB: [birth year], 79 y.o.   MRN: [id number]  Chief Complaint  Patient presents with  . Coronary Artery Disease  . Hyperlipidemia  . Follow-up   HPI:    [name]  is a 79 y.o. [place] male with coronary artery disease status post PTCA to diagonal 2 for acute MI in 2005, hypertension, hyperlipidemia, diabetes mellitus who  angiography on 05/11/2017, showed mild proximal RCA disease, moderate disease in true LAD and circumflex with negative FFR, patent D2 angioplasty site. It was felt the chest pain was likely due to esophageal spasm or GERD.   He presents for annual visit, states that for the past few months he has noticed exertional dyspnea and elevated heart rate when he exerts himself.  He has not had any chest pain or chest tightness.  He is also noticed his blood pressure to be elevated.  His daughter who translates for me is present.  Past Medical History:  Diagnosis Date  . Coronary artery disease    balloon angioplasty of diagonal branch of LAD in 2005  . DM2 (diabetes mellitus, type 2) ([place])   . Dyslipidemia   . GERD (gastroesophageal reflux disease)   . History of nuclear stress test 10/21/2009   dipyridamole; mod perfusion defect due to infarct/scar with mild periinfarct ischemia in mid ant/apical ant/mid anterolateral/apical lateral; no significant ischemia   . Hx of pulmonary embolus   . Hyperglycemia 03/31/2018  . Hypertension   . Ischemic cardiomyopathy    h/o  . MI (myocardial infarction) ([place]) 07/09/2003   anterior   . Mixed hyperlipidemia 03/31/2018  . OA (osteoarthritis)   . 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 Unstable angina ([place]) 05/11/2017   Past Surgical History:  Procedure Laterality Date  . CARDIAC CATHETERIZATION  07/08/2003   2.25x20 Maverick balloon angioplasty of diagonal 2 branch of LAD (Dr. Lowella Dell)  . CARDIAC CATHETERIZATION  03/25/2004   diagonal vessel patent w/40%  restenosis, 50-60% mid LAD stenosis (Dr. Jackie Plum)  . CARDIAC CATHETERIZATION  07/23/2006   no significant change from 2006 cath (Dr. Domenic Moras)   . INTRAVASCULAR PRESSURE WIRE/FFR STUDY N/A 05/11/2017   Procedure: INTRAVASCULAR PRESSURE WIRE/FFR STUDY;  Surgeon: Nigel Mormon, MD;  Location: [place] [facility];  Service: Cardiovascular;  Laterality: N/A;  . LEFT HEART CATH AND CORONARY ANGIOGRAPHY N/A 05/11/2017   Procedure: LEFT HEART CATH AND CORONARY ANGIOGRAPHY;  Surgeon: Nigel Mormon, MD;  Location: [place] [facility];  Service: Cardiovascular;  Laterality: N/A;  . TRANSTHORACIC ECHOCARDIOGRAM  05/24/2011   EF 55-60%, mild LVH, grade 1 diastolic dysfunction; AV sclerosis; normal CVP    Social History   Tobacco Use  . Smoking status: Former Smoker    Quit date: 12/28/1994    Years since quitting: 25.0  . Smokeless tobacco: Never Used  Substance Use Topics  . Alcohol use: No  Marital Status: Married    ROS  Review of Systems  Cardiovascular: Positive for dyspnea on exertion and palpitations. Negative for chest pain and leg swelling.  Gastrointestinal: Negative for melena.   Objective  Blood pressure (!) 150/83, pulse (!) 55, resp. rate 16, height 5' 4"  (1.626 m), weight 175 lb (79.4 kg), SpO2 97 %.  Vitals with BMI 01/26/2020 01/26/2020 01/24/2019  Height - 5' 4"  5' 4"   Weight - 175 lbs 165 lbs  BMI - 02.11 17.35  Systolic 670 141 030  Diastolic 83 80 64  Pulse 55 64 65      Physical Exam Constitutional:      Comments: Moderately built and well-nourished in no acute distress.  Appears younger than stated age.  HENT:     Head: Atraumatic.  Neck:     Thyroid: No thyromegaly.     Vascular: No JVD.  Cardiovascular:     Rate and Rhythm: Normal rate and regular rhythm.     Pulses: Intact distal pulses.     Heart sounds: Normal heart sounds. No murmur heard. No gallop.      Comments: No leg edema, no JVD. Pulmonary:     Effort: Pulmonary effort is normal.      Breath sounds: Normal breath sounds.  Abdominal:     General: Bowel sounds are normal.     Palpations: Abdomen is soft.  Musculoskeletal:        General: Normal range of motion.     Cervical back: Neck supple.  Skin:    General: Skin is warm and dry.  Neurological:     Mental Status: He is alert.    Laboratory examination:    No results for input(s): NA, K, CL, CO2, GLUCOSE, BUN, CREATININE, CALCIUM, GFRNONAA, GFRAA in the last 8760 hours. CrCl cannot be calculated (Patient's most recent lab result is older than the maximum 21 days allowed.).  CMP Latest Ref Rng & Units 07/18/2018 07/16/2018 07/14/2018  Glucose 70 - 99 mg/dL 256(H) 211(H) 94  BUN 8 - 23 mg/dL 22 28(H) 17  Creatinine 0.61 - 1.24 mg/dL 1.39(H) 1.76(H) 1.55(H)  Sodium 135 - 145 mmol/L 136 131(L) 136  Potassium 3.5 - 5.1 mmol/L 4.8 4.2 4.3  Chloride 98 - 111 mmol/L 105 103 100  CO2 22 - 32 mmol/L 21(L) 20(L) 22  Calcium 8.9 - 10.3 mg/dL 9.0 7.8(L) 8.9  Total Protein 6.5 - 8.1 g/dL - - 7.5  Total Bilirubin 0.3 - 1.2 mg/dL - - 0.7  Alkaline Phos 38 - 126 U/L - - 108  AST 15 - 41 U/L - - 34  ALT 0 - 44 U/L - - 35   CBC Latest Ref Rng & Units 07/16/2018 07/14/2018 07/13/2018  WBC 4.0 - 10.5 K/uL 5.4 4.6 5.3  Hemoglobin 13.0 - 17.0 g/dL 11.5(L) 12.5(L) 11.8(L)  Hematocrit 39.0 - 52.0 % 35.0(L) 37.6(L) 36.4(L)  Platelets 150 - 400 K/uL 175 151 138(L)   HEMOGLOBIN A1C Lab Results  Component Value Date   HGBA1C 6.2 (H) 07/16/2018   MPG 131.24 07/16/2018   TSH No results for input(s): TSH in the last 8760 hours.   External labs:   Cholesterol, total 151.000 m 01/23/2020 HDL 51.000 mg 01/23/2020 LDL-C 69.000 mg 01/23/2020 Triglycerides 188.000 m 01/23/2020  A1C 7.100 % 01/23/2020  Hemoglobin 13.500 g/d 08/08/2019  Creatinine, Serum 1.600 mg/ 01/23/2020 Potassium 4.300 mm 01/23/2020 ALT (SGPT) 22.000 U/L 01/23/2020   02/01/2018: Hemoglobin A1c 7.6%. Creatinine 1.42, EGFR 48/58, potassium 4.3, CMP otherwise  normal. Cholesterol 165, triglycerides 144, HDL 47, LDL 89.  Medications and allergies   Allergies  Allergen Reactions  . Ace Inhibitors Cough  . Lipitor [Atorvastatin Calcium] Other (See Comments)    Muscle aches  . Simvastatin Other (See Comments)    Muscle aches  . Advicor [Niacin-Lovastatin Er] Rash    Current Outpatient Medications on File Prior to Visit  Medication Sig Dispense Refill  . aspirin 81 MG chewable tablet Chew 1 tablet (81 mg total) by mouth daily. 60 tablet 6  .  colchicine 0.6 MG tablet Take 0.6 mg by mouth continuous as needed. Take 0.6 mg by mouth every eight hours until acute gout flare subsides  0  . glimepiride (AMARYL) 1 MG tablet Take 1 tablet by mouth daily. On hold    . glucosamine-chondroitin 500-400 MG tablet Take 1 tablet by mouth daily.     . isosorbide mononitrate (IMDUR) 60 MG 24 hr tablet Take 60 mg by mouth daily.    Marland Kitchen labetalol (NORMODYNE) 200 MG tablet TAKE ONE TABLET BY MOUTH AT BREAKFAST AND AT BEDTIME 180 tablet 2  . Multiple Vitamins-Minerals (CENTRUM SILVER 50+MEN) TABS Take 1 tablet by mouth daily.    . nitroGLYCERIN (NITROSTAT) 0.4 MG SL tablet Place 1 tablet (0.4 mg total) under the tongue every 5 (five) minutes as needed for chest pain. 25 tablet 2  . ondansetron (ZOFRAN) 8 MG tablet Take 8 mg by mouth every 8 (eight) hours as needed for nausea or vomiting.     . pantoprazole (PROTONIX) 40 MG tablet Take 40 mg by mouth daily.    . rosuvastatin (CRESTOR) 40 MG tablet Take 20 mg by mouth daily.     . vitamin C (VITAMIN C) 500 MG tablet Take 1 tablet (500 mg total) by mouth 2 (two) times daily. 60 tablet 0  . losartan (COZAAR) 50 MG tablet Take 50 mg by mouth at bedtime.     No current facility-administered medications on file prior to visit.     Radiology:   Chest X-Ray 01/07/2014: Normal chest x-ray.  Mild cardiomegaly.  No active cardiopulmonary disease.  Cardiac Studies:   Nuclear Stress Test, exercise myoview 10/18/2012: 1. Resting  EKG showed normal sinus rhythm, poor R wave progression. Stress EKG was negative for ischemia. Occasional PVC noted in recovery. Patient exercised on Bruce protocol for 7 minutes. The maximum work level achieved was 7.8 MET's. Hypertensive at rest and stress. The test was terminated due to achievement of the target heart rate and fatigue.  2. Perfusion images reveal scar in anterior, antero apical and apical lateral wall. There is no significant difference in comparison of stress and rest images on polar display. Dynamic gated images reveal anterior wall hypokinesis and endocardial thickening. Left ventricular ejection fraction was estimated to be 33%. This is a low risk study.  Coronary angiogram 05/11/2017: Moderate nonobstructive coronary artery disease. No true left main. Ostial 50%, mid 30-40% stenoses. FFR 0.83. Medium sized diagonal with diffuse 60% disease Ostial LCx 40% with resting Pd/Pa 0.98. Mid 20-30% disease Prox RCA 40% stenosis. Normal LVEDP, LVEF 55-65%.  Echocardiogram 01/25/2018: Left ventricle cavity is normal in size. Mild concentric hypertrophy of the left ventricle. Normal global wall motion. Doppler evidence of grade II (pseudonormal) diastolic dysfunction. Diastolic dysfunction findings suggests elevated LA/LV end diastolic pressure. Calculated EF 55%. Trace tricuspid regurgitation. Mild to moderate pulmonic regurgitation. Compared to 01/16/2013, no significant change. Diastolic dysfunction was grade I.  EKG:   EKG 01/26/2020: Normal sinus rhythm at rate of 55 bpm, left axis deviation, left intrafascicular block.  Nonspecific T abnormality.  Normal QT interval.  No significant change from 01/24/2019, sinus bradycardia new, previously heart rate 61 bpm.   Assessment     ICD-10-CM   1. Coronary artery disease of native artery of native heart with stable angina pectoris ([place])  I25.118 EKG 12-Lead  2. Essential hypertension  I10 amLODipine (NORVASC) 10 MG tablet  3. Mixed  hyperlipidemia  E78.2   4. Hyperglycemia  R73.9   5. Stage 3b chronic kidney  disease ([place])  N18.32   6. Dyspnea on exertion  R06.00 PCV ECHOCARDIOGRAM COMPLETE    Meds ordered this encounter  Medications  . amLODipine (NORVASC) 10 MG tablet    Sig: TAKE 1 TABLET BY MOUTH EVERY DAY AT BREAKFAST FOR BLOOD PRESSURE    Dispense:  90 tablet    Refill:  3    No refills remaining  10/25/2018 1:16:20 PM    Medications Discontinued During This Encounter  Medication Reason  . amLODipine (NORVASC) 5 MG tablet Reorder      Recommendations:   Meds ordered this encounter  Medications  . amLODipine (NORVASC) 10 MG tablet    Sig: TAKE 1 TABLET BY MOUTH EVERY DAY AT BREAKFAST FOR BLOOD PRESSURE    Dispense:  90 tablet    Refill:  3    No refills remaining  10/25/2018 1:16:20 PM    [name]  is a 79 y.o.  [place] male with coronary artery disease status post PTCA to diagonal 2 for acute MI in 2005, hypertension, hyperlipidemia, diabetes mellitus who  angiography on 05/11/2017, showed mild proximal RCA disease, moderate disease in true LAD and circumflex with negative FFR, patent D2 angioplasty site. It was felt the chest pain was likely due to esophageal spasm or GERD.   This is annual visit, he has noticed worsening dyspnea on exertion.  Suspect hypertension hypertensive heart disease to be the etiology, I will increase his amlodipine to 10 mg daily. In view of renal insufficiency will not increase losartan.  I will repeat an echocardiogram.  I have discussed with him regarding weight loss, 5 pound weight loss goal given to the patient.  I also discussed with the patient and his daughter regarding excessive intake of salt,Patient being [place] does admit to eating excessive amounts of salt.  Otherwise his labs are stable, lipids are well controlled, he has not had recurrence of angina pectoris and there is no clinical evidence of heart failure.  If symptoms do not resolve, I may consider  repeating a stress test.   Adrian Prows, MD, [facility] 01/26/2020, 3:40 PM Office: [phone] Pager: [phone]

## 2020-02-09 ENCOUNTER — Other Ambulatory Visit: Payer: Medicare Other

## 2020-02-12 ENCOUNTER — Other Ambulatory Visit: Payer: Self-pay

## 2020-02-12 ENCOUNTER — Ambulatory Visit: Payer: Medicare Other

## 2020-02-12 DIAGNOSIS — R0609 Other forms of dyspnea: Secondary | ICD-10-CM

## 2020-02-12 DIAGNOSIS — R0600 Dyspnea, unspecified: Secondary | ICD-10-CM

## 2020-02-16 DIAGNOSIS — I25119 Atherosclerotic heart disease of native coronary artery with unspecified angina pectoris: Secondary | ICD-10-CM | POA: Diagnosis not present

## 2020-02-16 DIAGNOSIS — E782 Mixed hyperlipidemia: Secondary | ICD-10-CM | POA: Diagnosis not present

## 2020-02-16 DIAGNOSIS — E1129 Type 2 diabetes mellitus with other diabetic kidney complication: Secondary | ICD-10-CM | POA: Diagnosis not present

## 2020-02-16 DIAGNOSIS — I209 Angina pectoris, unspecified: Secondary | ICD-10-CM | POA: Diagnosis not present

## 2020-02-16 DIAGNOSIS — I129 Hypertensive chronic kidney disease with stage 1 through stage 4 chronic kidney disease, or unspecified chronic kidney disease: Secondary | ICD-10-CM | POA: Diagnosis not present

## 2020-02-16 DIAGNOSIS — I2510 Atherosclerotic heart disease of native coronary artery without angina pectoris: Secondary | ICD-10-CM | POA: Diagnosis not present

## 2020-02-16 DIAGNOSIS — N183 Chronic kidney disease, stage 3 unspecified: Secondary | ICD-10-CM | POA: Diagnosis not present

## 2020-02-16 DIAGNOSIS — K219 Gastro-esophageal reflux disease without esophagitis: Secondary | ICD-10-CM | POA: Diagnosis not present

## 2020-02-29 NOTE — Progress Notes (Deleted)
Primary Physician/Referring:  [name], MD  Patient ID: [name], male    DOB: [birth year], 80 y.o.   MRN: [id number]  No chief complaint on file.  HPI:    [name]  is a 80 y.o. [place] male with coronary artery disease status post PTCA to diagonal 2 for acute MI in 2005, hypertension, hyperlipidemia, diabetes mellitus who  angiography on 05/11/2017, showed mild proximal RCA disease, moderate disease in true LAD and circumflex with negative FFR, patent D2 angioplasty site. It was felt the chest pain was likely due to esophageal spasm or GERD.   He presents for annual visit, states that for the past few months he has noticed exertional dyspnea and elevated heart rate when he exerts himself.  He has not had any chest pain or chest tightness.  He is also noticed his blood pressure to be elevated.  His daughter who translates for me is present.  ***Patient presents for 82 week follow up of hypertension. At last visit (annual visit) increased amlodipine to 10 mg daily and obtained echocardiogram in view of worsening dyspnea on exertion.   Past Medical History:  Diagnosis Date  . Coronary artery disease    balloon angioplasty of diagonal branch of LAD in 2005  . DM2 (diabetes mellitus, type 2) ([place])   . Dyslipidemia   . GERD (gastroesophageal reflux disease)   . History of nuclear stress test 10/21/2009   dipyridamole; mod perfusion defect due to infarct/scar with mild periinfarct ischemia in mid ant/apical ant/mid anterolateral/apical lateral; no significant ischemia   . Hx of pulmonary embolus   . Hyperglycemia 03/31/2018  . Hypertension   . Ischemic cardiomyopathy    h/o  . MI (myocardial infarction) ([place]) 07/09/2003   anterior   . Mixed hyperlipidemia 03/31/2018  . OA (osteoarthritis)   . 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 Unstable angina ([place]) 05/11/2017   Past Surgical History:  Procedure Laterality Date  . CARDIAC CATHETERIZATION  07/08/2003   2.25x20 [place] balloon  angioplasty of diagonal 2 branch of LAD (Dr. Lowella Dell)  . CARDIAC CATHETERIZATION  03/25/2004   diagonal vessel patent w/40% restenosis, 50-60% mid LAD stenosis (Dr. Jackie Plum)  . CARDIAC CATHETERIZATION  07/23/2006   no significant change from 2006 cath (Dr. Domenic Moras)   . INTRAVASCULAR PRESSURE WIRE/FFR STUDY N/A 05/11/2017   Procedure: INTRAVASCULAR PRESSURE WIRE/FFR STUDY;  Surgeon: Nigel Mormon, MD;  Location: [facility];  Service: Cardiovascular;  Laterality: N/A;  . LEFT HEART CATH AND CORONARY ANGIOGRAPHY N/A 05/11/2017   Procedure: LEFT HEART CATH AND CORONARY ANGIOGRAPHY;  Surgeon: Nigel Mormon, MD;  Location: [facility];  Service: Cardiovascular;  Laterality: N/A;  . TRANSTHORACIC ECHOCARDIOGRAM  05/24/2011   EF 55-60%, mild LVH, grade 1 diastolic dysfunction; AV sclerosis; normal CVP    Social History   Tobacco Use  . Smoking status: Former Smoker    Quit date: 12/28/1994    Years since quitting: 25.1  . Smokeless tobacco: Never Used  Substance Use Topics  . Alcohol use: No  Marital Status: Married    ROS  Review of Systems  Cardiovascular: Positive for dyspnea on exertion and palpitations. Negative for chest pain and leg swelling.  Gastrointestinal: Negative for melena.   Objective  There were no vitals taken for this visit.  Vitals with BMI 01/26/2020 01/26/2020 01/24/2019  Height - $Remove'5\' 4"'PagNUfm$  $RemoveB'5\' 4"'pwROAcBq$   Weight - 175 lbs 165 lbs  BMI - 57.26 20.35  Systolic  150 164 122  Diastolic 83 80 64  Pulse 55 64 65      Physical Exam Constitutional:      Comments: Moderately built and well-nourished in no acute distress.  Appears younger than stated age.  HENT:     Head: Atraumatic.  Neck:     Thyroid: No thyromegaly.     Vascular: No JVD.  Cardiovascular:     Rate and Rhythm: Normal rate and regular rhythm.     Pulses: Intact distal pulses.     Heart sounds: Normal heart sounds. No murmur heard. No gallop.      Comments: No leg edema, no  JVD. Pulmonary:     Effort: Pulmonary effort is normal.     Breath sounds: Normal breath sounds.  Abdominal:     General: Bowel sounds are normal.     Palpations: Abdomen is soft.  Musculoskeletal:        General: Normal range of motion.     Cervical back: Neck supple.  Skin:    General: Skin is warm and dry.  Neurological:     Mental Status: He is alert.    Laboratory examination:    No results for input(s): NA, K, CL, CO2, GLUCOSE, BUN, CREATININE, CALCIUM, GFRNONAA, GFRAA in the last 8760 hours. CrCl cannot be calculated (Patient's most recent lab result is older than the maximum 21 days allowed.).  CMP Latest Ref Rng & Units 07/18/2018 07/16/2018 07/14/2018  Glucose 70 - 99 mg/dL 201(A) 046(M) 94  BUN 8 - 23 mg/dL 22 98(N) 17  Creatinine 0.61 - 1.24 mg/dL 2.98(B) 9.34(U) 5.57(H)  Sodium 135 - 145 mmol/L 136 131(L) 136  Potassium 3.5 - 5.1 mmol/L 4.8 4.2 4.3  Chloride 98 - 111 mmol/L 105 103 100  CO2 22 - 32 mmol/L 21(L) 20(L) 22  Calcium 8.9 - 10.3 mg/dL 9.0 1.4(N) 8.9  Total Protein 6.5 - 8.1 g/dL - - 7.5  Total Bilirubin 0.3 - 1.2 mg/dL - - 0.7  Alkaline Phos 38 - 126 U/L - - 108  AST 15 - 41 U/L - - 34  ALT 0 - 44 U/L - - 35   CBC Latest Ref Rng & Units 07/16/2018 07/14/2018 07/13/2018  WBC 4.0 - 10.5 K/uL 5.4 4.6 5.3  Hemoglobin 13.0 - 17.0 g/dL 11.5(L) 12.5(L) 11.8(L)  Hematocrit 39.0 - 52.0 % 35.0(L) 37.6(L) 36.4(L)  Platelets 150 - 400 K/uL 175 151 138(L)   HEMOGLOBIN A1C Lab Results  Component Value Date   HGBA1C 6.2 (H) 07/16/2018   MPG 131.24 07/16/2018   TSH No results for input(s): TSH in the last 8760 hours.   External labs:   Cholesterol, total 151.000 m 01/23/2020 HDL 51.000 mg 01/23/2020 LDL-C 69.000 mg 01/23/2020 Triglycerides 188.000 m 01/23/2020  A1C 7.100 % 01/23/2020  Hemoglobin 13.500 g/d 08/08/2019  Creatinine, Serum 1.600 mg/ 01/23/2020 Potassium 4.300 mm 01/23/2020 ALT (SGPT) 22.000 U/L 01/23/2020   02/01/2018: Hemoglobin A1c 7.6%.  Creatinine 1.42, EGFR 48/58, potassium 4.3, CMP otherwise normal. Cholesterol 165, triglycerides 144, HDL 47, LDL 89.  Medications and allergies   Allergies  Allergen Reactions  . Ace Inhibitors Cough  . Lipitor [Atorvastatin Calcium] Other (See Comments)    Muscle aches  . Simvastatin Other (See Comments)    Muscle aches  . Advicor [Niacin-Lovastatin Er] Rash    Current Outpatient Medications on File Prior to Visit  Medication Sig Dispense Refill  . amLODipine (NORVASC) 10 MG tablet TAKE 1 TABLET BY MOUTH EVERY DAY AT BREAKFAST FOR  BLOOD PRESSURE 90 tablet 3  . aspirin 81 MG chewable tablet Chew 1 tablet (81 mg total) by mouth daily. 60 tablet 6  . colchicine 0.6 MG tablet Take 0.6 mg by mouth continuous as needed. Take 0.6 mg by mouth every eight hours until acute gout flare subsides  0  . glimepiride (AMARYL) 1 MG tablet Take 1 tablet by mouth daily. On hold    . glucosamine-chondroitin 500-400 MG tablet Take 1 tablet by mouth daily.     . isosorbide mononitrate (IMDUR) 60 MG 24 hr tablet Take 60 mg by mouth daily.    Marland Kitchen labetalol (NORMODYNE) 200 MG tablet TAKE ONE TABLET BY MOUTH AT BREAKFAST AND AT BEDTIME 180 tablet 2  . losartan (COZAAR) 50 MG tablet Take 50 mg by mouth at bedtime.    . Multiple Vitamins-Minerals (CENTRUM SILVER 50+MEN) TABS Take 1 tablet by mouth daily.    . nitroGLYCERIN (NITROSTAT) 0.4 MG SL tablet Place 1 tablet (0.4 mg total) under the tongue every 5 (five) minutes as needed for chest pain. 25 tablet 2  . ondansetron (ZOFRAN) 8 MG tablet Take 8 mg by mouth every 8 (eight) hours as needed for nausea or vomiting.     . pantoprazole (PROTONIX) 40 MG tablet Take 40 mg by mouth daily.    . rosuvastatin (CRESTOR) 40 MG tablet Take 20 mg by mouth daily.     . vitamin C (VITAMIN C) 500 MG tablet Take 1 tablet (500 mg total) by mouth 2 (two) times daily. 60 tablet 0   No current facility-administered medications on file prior to visit.     Radiology:   Chest  X-Ray 01/07/2014: Normal chest x-ray.  Mild cardiomegaly.  No active cardiopulmonary disease.  Cardiac Studies:   Nuclear Stress Test, exercise myoview 10/18/2012: 1. Resting EKG showed normal sinus rhythm, poor R wave progression. Stress EKG was negative for ischemia. Occasional PVC noted in recovery. Patient exercised on Bruce protocol for 7 minutes. The maximum work level achieved was 7.8 MET's. Hypertensive at rest and stress. The test was terminated due to achievement of the target heart rate and fatigue.  2. Perfusion images reveal scar in anterior, antero apical and apical lateral wall. There is no significant difference in comparison of stress and rest images on polar display. Dynamic gated images reveal anterior wall hypokinesis and endocardial thickening. Left ventricular ejection fraction was estimated to be 33%. This is a low risk study.  Coronary angiogram 05/11/2017: Moderate nonobstructive coronary artery disease. No true left main. Ostial 50%, mid 30-40% stenoses. FFR 0.83. Medium sized diagonal with diffuse 60% disease Ostial LCx 40% with resting Pd/Pa 0.98. Mid 20-30% disease Prox RCA 40% stenosis. Normal LVEDP, LVEF 55-65%.  Echocardiogram 01/25/2018: Left ventricle cavity is normal in size. Mild concentric hypertrophy of the left ventricle. Normal global wall motion. Doppler evidence of grade II (pseudonormal) diastolic dysfunction. Diastolic dysfunction findings suggests elevated LA/LV end diastolic pressure. Calculated EF 55%. Trace tricuspid regurgitation. Mild to moderate pulmonic regurgitation. Compared to 01/16/2013, no significant change. Diastolic dysfunction was grade I.  PCV ECHOCARDIOGRAM COMPLETE 95/18/8416 Normal LV systolic function with visual EF 55-60%. Left ventricle cavity is normal in size. Normal global wall motion. Normal diastolic filling pattern, indeterminate LAP. Mild (Grade I) aortic regurgitation. Mild (Grade I) mitral regurgitation. Moderate pulmonic  regurgitation. Compared to prior study dated 01/25/2018 no significant change.  EKG   EKG 01/26/2020: Normal sinus rhythm at rate of 55 bpm, left axis deviation, left intrafascicular block.  Nonspecific T  abnormality.  Normal QT interval.  No significant change from 01/24/2019, sinus bradycardia new, previously heart rate 61 bpm.   Assessment     ICD-10-CM   1. Essential hypertension  I10   2. Dyspnea on exertion  R06.00   3. Coronary artery disease of native artery of native heart with stable angina pectoris ([place])  I25.118     No orders of the defined types were placed in this encounter.   There are no discontinued medications.    Recommendations:   No orders of the defined types were placed in this encounter.   [name]  is a 80 y.o.  [place] male with coronary artery disease status post PTCA to diagonal 2 for acute MI in 2005, hypertension, hyperlipidemia, diabetes mellitus who  angiography on 05/11/2017, showed mild proximal RCA disease, moderate disease in true LAD and circumflex with negative FFR, patent D2 angioplasty site. It was felt the chest pain was likely due to esophageal spasm or GERD.   This is annual visit, he has noticed worsening dyspnea on exertion.  Suspect hypertension hypertensive heart disease to be the etiology, I will increase his amlodipine to 10 mg daily. In view of renal insufficiency will not increase losartan.  I will repeat an echocardiogram.  I have discussed with him regarding weight loss, 5 pound weight loss goal given to the patient.  I also discussed with the patient and his daughter regarding excessive intake of salt,Patient being [place] does admit to eating excessive amounts of salt.  Otherwise his labs are stable, lipids are well controlled, he has not had recurrence of angina pectoris and there is no clinical evidence of heart failure.  If symptoms do not resolve, I may consider repeating a stress test.  ***Echo unchanged. BP  controlled?

## 2020-03-01 ENCOUNTER — Ambulatory Visit: Payer: Medicare Other | Admitting: Student

## 2020-03-01 DIAGNOSIS — R0600 Dyspnea, unspecified: Secondary | ICD-10-CM

## 2020-03-01 DIAGNOSIS — I10 Essential (primary) hypertension: Secondary | ICD-10-CM

## 2020-03-01 DIAGNOSIS — I25118 Atherosclerotic heart disease of native coronary artery with other forms of angina pectoris: Secondary | ICD-10-CM

## 2020-03-03 ENCOUNTER — Telehealth: Payer: Self-pay

## 2020-03-10 ENCOUNTER — Ambulatory Visit: Payer: Medicare Other | Admitting: Student

## 2020-03-10 NOTE — Progress Notes (Signed)
Primary Physician/Referring:  [name], MD  Patient ID: [name], male    DOB: 24-Sep-1940, 80 y.o.   MRN: [id number]  Chief Complaint  Patient presents with  . Hypertension  . Follow-up    6 weeks   HPI:    [name]  is a 80 y.o. [place] male with coronary artery disease status post PTCA to diagonal 2 for acute MI in 2005, hypertension, hyperlipidemia, diabetes mellitus who  angiography on 05/11/2017, showed mild proximal RCA disease, moderate disease in true LAD and circumflex with negative FFR, patent D2 angioplasty site. It was felt the chest pain was likely due to esophageal spasm or GERD.   Patient presents for follow up of hypertension.  Patient's daughter is present at the bedside, she acts as Optometrist.  At last visit (annual visit) increased amlodipine to 10 mg daily and obtained echocardiogram in view of worsening dyspnea on exertion.  Patient reports mild improvement of dyspnea on exertion, however continues to experience this particularly when outside in cold weather.  Denies chest pain, palpitations, syncope, near syncope, leg swelling.  Patient has been monitoring his blood pressure regularly at home, and it remains elevated above goal despite increased amlodipine dose.  Past Medical History:  Diagnosis Date  . Coronary artery disease    balloon angioplasty of diagonal branch of LAD in 2005  . DM2 (diabetes mellitus, type 2) ([place])   . Dyslipidemia   . GERD (gastroesophageal reflux disease)   . History of nuclear stress test 10/21/2009   dipyridamole; mod perfusion defect due to infarct/scar with mild periinfarct ischemia in mid ant/apical ant/mid anterolateral/apical lateral; no significant ischemia   . Hx of pulmonary embolus   . Hyperglycemia 03/31/2018  . Hypertension   . Ischemic cardiomyopathy    h/o  . MI (myocardial infarction) ([place]) 07/09/2003   anterior   . Mixed hyperlipidemia 03/31/2018  . OA (osteoarthritis)   . 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  Unstable angina ([place]) 05/11/2017   Past Surgical History:  Procedure Laterality Date  . CARDIAC CATHETERIZATION  07/08/2003   2.25x20 [place] balloon angioplasty of diagonal 2 branch of LAD (Dr. Lowella Dell)  . CARDIAC CATHETERIZATION  03/25/2004   diagonal vessel patent w/40% restenosis, 50-60% mid LAD stenosis (Dr. Jackie Plum)  . CARDIAC CATHETERIZATION  07/23/2006   no significant change from 2006 cath (Dr. Domenic Moras)   . INTRAVASCULAR PRESSURE WIRE/FFR STUDY N/A 05/11/2017   Procedure: INTRAVASCULAR PRESSURE WIRE/FFR STUDY;  Surgeon: Nigel Mormon, MD;  Location: [facility];  Service: Cardiovascular;  Laterality: N/A;  . LEFT HEART CATH AND CORONARY ANGIOGRAPHY N/A 05/11/2017   Procedure: LEFT HEART CATH AND CORONARY ANGIOGRAPHY;  Surgeon: Nigel Mormon, MD;  Location: [facility];  Service: Cardiovascular;  Laterality: N/A;  . TRANSTHORACIC ECHOCARDIOGRAM  05/24/2011   EF 55-60%, mild LVH, grade 1 diastolic dysfunction; AV sclerosis; normal CVP    Social History   Tobacco Use  . Smoking status: Former Smoker    Quit date: 12/28/1994    Years since quitting: 25.2  . Smokeless tobacco: Never Used  Substance Use Topics  . Alcohol use: No  Marital Status: Married    ROS  Review of Systems  Constitutional: Negative for malaise/fatigue and weight gain.  Cardiovascular: Positive for dyspnea on exertion. Negative for chest pain, claudication, leg swelling, near-syncope, orthopnea, palpitations, paroxysmal nocturnal dyspnea and syncope.  Respiratory: Negative for shortness of breath.   Hematologic/Lymphatic: Does not bruise/bleed easily.  Gastrointestinal: Negative for melena.  Neurological: Negative for dizziness and weakness.   Objective  Blood pressure 131/69, pulse (!) 58, temperature 97.8 F (36.6 C), temperature source Temporal, resp. rate 16, height 5' 4"  (1.626 m), weight 169 lb (76.7 kg), SpO2 97 %.  Vitals with BMI 03/12/2020 01/26/2020 01/26/2020   Height 5' 4"  - 5' 4"   Weight 169 lbs - 175 lbs  BMI 30.16 - 01.09  Systolic 323 557 322  Diastolic 69 83 80  Pulse 58 55 64    Orthostatic VS for the past 72 hrs (Last 3 readings):  Orthostatic BP Patient Position BP Location Cuff Size Orthostatic Pulse  03/12/20 1332 140/68 Standing Left Arm Normal 66  03/12/20 1331 141/71 Sitting Left Arm Normal 61  03/12/20 1330 131/72 Supine Left Arm Normal 67    Physical Exam Vitals reviewed.  Constitutional:      Comments: Moderately built and well-nourished in no acute distress.  Appears younger than stated age.  HENT:     Head: Normocephalic and atraumatic.  Neck:     Thyroid: No thyromegaly.     Vascular: No JVD.  Cardiovascular:     Rate and Rhythm: Normal rate and regular rhythm.     Pulses: Intact distal pulses.     Heart sounds: Normal heart sounds, S1 normal and S2 normal. No murmur heard. No gallop.      Comments: No leg edema, no JVD. Pulmonary:     Effort: Pulmonary effort is normal. No respiratory distress.     Breath sounds: Normal breath sounds. No wheezing, rhonchi or rales.  Abdominal:     General: Bowel sounds are normal.     Palpations: Abdomen is soft.  Musculoskeletal:        General: Normal range of motion.     Cervical back: Neck supple.     Right lower leg: No edema.     Left lower leg: No edema.  Skin:    General: Skin is warm and dry.  Neurological:     Mental Status: He is alert.    Laboratory examination:    No results for input(s): NA, K, CL, CO2, GLUCOSE, BUN, CREATININE, CALCIUM, GFRNONAA, GFRAA in the last 8760 hours. CrCl cannot be calculated (Patient's most recent lab result is older than the maximum 21 days allowed.).  CMP Latest Ref Rng & Units 07/18/2018 07/16/2018 07/14/2018  Glucose 70 - 99 mg/dL 256(H) 211(H) 94  BUN 8 - 23 mg/dL 22 28(H) 17  Creatinine 0.61 - 1.24 mg/dL 1.39(H) 1.76(H) 1.55(H)  Sodium 135 - 145 mmol/L 136 131(L) 136  Potassium 3.5 - 5.1 mmol/L 4.8 4.2 4.3  Chloride 98  - 111 mmol/L 105 103 100  CO2 22 - 32 mmol/L 21(L) 20(L) 22  Calcium 8.9 - 10.3 mg/dL 9.0 7.8(L) 8.9  Total Protein 6.5 - 8.1 g/dL - - 7.5  Total Bilirubin 0.3 - 1.2 mg/dL - - 0.7  Alkaline Phos 38 - 126 U/L - - 108  AST 15 - 41 U/L - - 34  ALT 0 - 44 U/L - - 35   CBC Latest Ref Rng & Units 07/16/2018 07/14/2018 07/13/2018  WBC 4.0 - 10.5 K/uL 5.4 4.6 5.3  Hemoglobin 13.0 - 17.0 g/dL 11.5(L) 12.5(L) 11.8(L)  Hematocrit 39.0 - 52.0 % 35.0(L) 37.6(L) 36.4(L)  Platelets 150 - 400 K/uL 175 151 138(L)   HEMOGLOBIN A1C Lab Results  Component Value Date   HGBA1C 6.2 (H) 07/16/2018   MPG 131.24 07/16/2018   TSH No  results for input(s): TSH in the last 8760 hours.   External labs:   Cholesterol, total 151.000 m 01/23/2020 HDL 51.000 mg 01/23/2020 LDL-C 69.000 mg 01/23/2020 Triglycerides 188.000 m 01/23/2020  A1C 7.100 % 01/23/2020  Hemoglobin 13.500 g/d 08/08/2019  Creatinine, Serum 1.600 mg/ 01/23/2020 Potassium 4.300 mm 01/23/2020 ALT (SGPT) 22.000 U/L 01/23/2020   02/01/2018: Hemoglobin A1c 7.6%. Creatinine 1.42, EGFR 48/58, potassium 4.3, CMP otherwise normal. Cholesterol 165, triglycerides 144, HDL 47, LDL 89.  Medications and allergies   Allergies  Allergen Reactions  . Ace Inhibitors Cough  . Lipitor [Atorvastatin Calcium] Other (See Comments)    Muscle aches  . Simvastatin Other (See Comments)    Muscle aches  . Advicor [Niacin-Lovastatin Er] Rash    Current Outpatient Medications on File Prior to Visit  Medication Sig Dispense Refill  . amLODipine (NORVASC) 10 MG tablet TAKE 1 TABLET BY MOUTH EVERY DAY AT BREAKFAST FOR BLOOD PRESSURE 90 tablet 3  . aspirin 81 MG chewable tablet Chew 1 tablet (81 mg total) by mouth daily. 60 tablet 6  . colchicine 0.6 MG tablet Take 0.6 mg by mouth continuous as needed. Take 0.6 mg by mouth every eight hours until acute gout flare subsides  0  . glimepiride (AMARYL) 1 MG tablet Take 1 tablet by mouth daily. On hold    .  glucosamine-chondroitin 500-400 MG tablet Take 1 tablet by mouth daily.     . isosorbide mononitrate (IMDUR) 60 MG 24 hr tablet Take 60 mg by mouth daily.    Marland Kitchen labetalol (NORMODYNE) 200 MG tablet TAKE ONE TABLET BY MOUTH AT BREAKFAST AND AT BEDTIME 180 tablet 2  . losartan (COZAAR) 50 MG tablet Take 50 mg by mouth at bedtime.    . Multiple Vitamins-Minerals (CENTRUM SILVER 50+MEN) TABS Take 1 tablet by mouth daily.    . nitroGLYCERIN (NITROSTAT) 0.4 MG SL tablet Place 1 tablet (0.4 mg total) under the tongue every 5 (five) minutes as needed for chest pain. 25 tablet 2  . ondansetron (ZOFRAN) 8 MG tablet Take 8 mg by mouth every 8 (eight) hours as needed for nausea or vomiting.     . pantoprazole (PROTONIX) 40 MG tablet Take 40 mg by mouth daily.    . polyethylene glycol (MIRALAX / GLYCOLAX) 17 g packet 1 packet mixed with 8 ounces of fluid    . rosuvastatin (CRESTOR) 40 MG tablet Take 20 mg by mouth daily.     . simethicone (MYLICON) 80 MG chewable tablet 1 tablet after meals and at bedtime as needed    . vitamin C (VITAMIN C) 500 MG tablet Take 1 tablet (500 mg total) by mouth 2 (two) times daily. 60 tablet 0  . Multiple Vitamins-Minerals (CENTRUM SILVER 50+MEN PO) 1 tablet     No current facility-administered medications on file prior to visit.     Radiology:   Chest X-Ray 01/07/2014: Normal chest x-ray.  Mild cardiomegaly.  No active cardiopulmonary disease.  Cardiac Studies:   Nuclear Stress Test, exercise myoview 10/18/2012: 1. Resting EKG showed normal sinus rhythm, poor R wave progression. Stress EKG was negative for ischemia. Occasional PVC noted in recovery. Patient exercised on Bruce protocol for 7 minutes. The maximum work level achieved was 7.8 MET's. Hypertensive at rest and stress. The test was terminated due to achievement of the target heart rate and fatigue.  2. Perfusion images reveal scar in anterior, antero apical and apical lateral wall. There is no significant difference  in comparison of stress and rest  images on polar display. Dynamic gated images reveal anterior wall hypokinesis and endocardial thickening. Left ventricular ejection fraction was estimated to be 33%. This is a low risk study.  Coronary angiogram 05/11/2017: Moderate nonobstructive coronary artery disease. No true left main. Ostial 50%, mid 30-40% stenoses. FFR 0.83. Medium sized diagonal with diffuse 60% disease Ostial LCx 40% with resting Pd/Pa 0.98. Mid 20-30% disease Prox RCA 40% stenosis. Normal LVEDP, LVEF 55-65%.  Echocardiogram 01/25/2018: Left ventricle cavity is normal in size. Mild concentric hypertrophy of the left ventricle. Normal global wall motion. Doppler evidence of grade II (pseudonormal) diastolic dysfunction. Diastolic dysfunction findings suggests elevated LA/LV end diastolic pressure. Calculated EF 55%. Trace tricuspid regurgitation. Mild to moderate pulmonic regurgitation. Compared to 01/16/2013, no significant change. Diastolic dysfunction was grade I.  PCV ECHOCARDIOGRAM COMPLETE 83/38/2505 Normal LV systolic function with visual EF 55-60%. Left ventricle cavity is normal in size. Normal global wall motion. Normal diastolic filling pattern, indeterminate LAP. Mild (Grade I) aortic regurgitation. Mild (Grade I) mitral regurgitation. Moderate pulmonic regurgitation. Compared to prior study dated 01/25/2018 no significant change.  EKG   EKG 01/26/2020: Normal sinus rhythm at rate of 55 bpm, left axis deviation, left intrafascicular block.  Nonspecific T abnormality.  Normal QT interval.  No significant change from 01/24/2019, sinus bradycardia new, previously heart rate 61 bpm.   Assessment     ICD-10-CM   1. Dyspnea on exertion  R06.00 PCV MYOCARDIAL PERFUSION WO LEXISCAN  2. Coronary artery disease of native artery of native heart with stable angina pectoris ([place])  I25.118 PCV MYOCARDIAL PERFUSION WO LEXISCAN  3. Essential hypertension  I10     Meds ordered this  encounter  Medications  . hydrALAZINE (APRESOLINE) 25 MG tablet    Sig: Take 1 tablet (25 mg total) by mouth 2 (two) times daily. Take additional dose as needed for blood pressure >140/90 mmHg    Dispense:  90 tablet    Refill:  3    There are no discontinued medications.    Recommendations:   [name]  is a 80 y.o.  [place] male with coronary artery disease status post PTCA to diagonal 2 for acute MI in 2005, hypertension, hyperlipidemia, diabetes mellitus who  angiography on 05/11/2017, showed mild proximal RCA disease, moderate disease in true LAD and circumflex with negative FFR, patent D2 angioplasty site. It was felt the chest pain was likely due to esophageal spasm or GERD.   Patient presents for 6-week follow-up of hypertension and dyspnea on exertion.  Patient's blood pressure remains elevated above goal despite increased dose of amlodipine at last visit.  Therefore will start patient on hydralazine 25 mg twice daily.  Advised patient to check his blood pressure and if it is >140/90 mmHg he may take additional dose of hydralazine.  Patient and his daughter both verbalized understanding.  Discussed and reviewed with patient results of echocardiogram which revealed normal LVEF, normal diastolic filling pattern and mild MR, mild AR, and moderate pulmonic regurgitation, no significant change compared to study in 2019.  As patient continues to have dyspnea on exertion shared decision with patient and his daughter was to proceed with nuclear stress testing to evaluate for underlying ischemia.  Discussed again regarding importance of low-salt diet for blood pressure control.  Follow-up in 6 weeks for hypertension and results of cardiac testing.     Alethia Berthold, PA-C 03/14/2020, 7:14 PM Office: [phone]

## 2020-03-12 ENCOUNTER — Encounter: Payer: Self-pay | Admitting: Student

## 2020-03-12 ENCOUNTER — Ambulatory Visit: Payer: Medicare Other | Admitting: Student

## 2020-03-12 ENCOUNTER — Other Ambulatory Visit: Payer: Self-pay

## 2020-03-12 VITALS — BP 131/69 | HR 58 | Temp 97.8°F | Resp 16 | Ht 64.0 in | Wt 169.0 lb

## 2020-03-12 DIAGNOSIS — I10 Essential (primary) hypertension: Secondary | ICD-10-CM

## 2020-03-12 DIAGNOSIS — R0600 Dyspnea, unspecified: Secondary | ICD-10-CM

## 2020-03-12 DIAGNOSIS — I25118 Atherosclerotic heart disease of native coronary artery with other forms of angina pectoris: Secondary | ICD-10-CM | POA: Diagnosis not present

## 2020-03-12 DIAGNOSIS — R0609 Other forms of dyspnea: Secondary | ICD-10-CM

## 2020-03-12 MED ORDER — HYDRALAZINE HCL 25 MG PO TABS: 25.0000 mg | ORAL_TABLET | Freq: Two times a day (BID) | ORAL | 3 refills | Status: DC

## 2020-03-16 DIAGNOSIS — I209 Angina pectoris, unspecified: Secondary | ICD-10-CM | POA: Diagnosis not present

## 2020-03-16 DIAGNOSIS — I129 Hypertensive chronic kidney disease with stage 1 through stage 4 chronic kidney disease, or unspecified chronic kidney disease: Secondary | ICD-10-CM | POA: Diagnosis not present

## 2020-03-16 DIAGNOSIS — E782 Mixed hyperlipidemia: Secondary | ICD-10-CM | POA: Diagnosis not present

## 2020-03-16 DIAGNOSIS — N183 Chronic kidney disease, stage 3 unspecified: Secondary | ICD-10-CM | POA: Diagnosis not present

## 2020-03-16 DIAGNOSIS — I25119 Atherosclerotic heart disease of native coronary artery with unspecified angina pectoris: Secondary | ICD-10-CM | POA: Diagnosis not present

## 2020-03-16 DIAGNOSIS — K219 Gastro-esophageal reflux disease without esophagitis: Secondary | ICD-10-CM | POA: Diagnosis not present

## 2020-03-16 DIAGNOSIS — E1129 Type 2 diabetes mellitus with other diabetic kidney complication: Secondary | ICD-10-CM | POA: Diagnosis not present

## 2020-03-16 DIAGNOSIS — I2510 Atherosclerotic heart disease of native coronary artery without angina pectoris: Secondary | ICD-10-CM | POA: Diagnosis not present

## 2020-03-24 ENCOUNTER — Ambulatory Visit: Payer: Medicare Other

## 2020-03-24 ENCOUNTER — Other Ambulatory Visit: Payer: Self-pay

## 2020-03-24 DIAGNOSIS — R0609 Other forms of dyspnea: Secondary | ICD-10-CM

## 2020-03-24 DIAGNOSIS — R0600 Dyspnea, unspecified: Secondary | ICD-10-CM

## 2020-03-24 DIAGNOSIS — I25118 Atherosclerotic heart disease of native coronary artery with other forms of angina pectoris: Secondary | ICD-10-CM

## 2020-03-31 ENCOUNTER — Other Ambulatory Visit: Payer: Self-pay | Admitting: Student

## 2020-03-31 NOTE — Progress Notes (Signed)
Please inform patient his stress test showed previously noted anterior scar to the muscle of the heart as well as some ischemia around that scar.  No immediate intervention necessary, recommend continued medical therapy.  We will discuss further at upcoming office visit.

## 2020-03-31 NOTE — Progress Notes (Signed)
Called patient, daughter wanted to speak to provider.

## 2020-03-31 NOTE — Progress Notes (Signed)
Spoke to patient's daughter she verbalized understanding of results.

## 2020-04-07 NOTE — Telephone Encounter (Signed)
done

## 2020-04-13 DIAGNOSIS — I25119 Atherosclerotic heart disease of native coronary artery with unspecified angina pectoris: Secondary | ICD-10-CM | POA: Diagnosis not present

## 2020-04-13 DIAGNOSIS — I129 Hypertensive chronic kidney disease with stage 1 through stage 4 chronic kidney disease, or unspecified chronic kidney disease: Secondary | ICD-10-CM | POA: Diagnosis not present

## 2020-04-13 DIAGNOSIS — K219 Gastro-esophageal reflux disease without esophagitis: Secondary | ICD-10-CM | POA: Diagnosis not present

## 2020-04-13 DIAGNOSIS — E782 Mixed hyperlipidemia: Secondary | ICD-10-CM | POA: Diagnosis not present

## 2020-04-13 DIAGNOSIS — N183 Chronic kidney disease, stage 3 unspecified: Secondary | ICD-10-CM | POA: Diagnosis not present

## 2020-04-13 DIAGNOSIS — E1129 Type 2 diabetes mellitus with other diabetic kidney complication: Secondary | ICD-10-CM | POA: Diagnosis not present

## 2020-04-13 DIAGNOSIS — I209 Angina pectoris, unspecified: Secondary | ICD-10-CM | POA: Diagnosis not present

## 2020-04-13 DIAGNOSIS — I2510 Atherosclerotic heart disease of native coronary artery without angina pectoris: Secondary | ICD-10-CM | POA: Diagnosis not present

## 2020-04-22 NOTE — Progress Notes (Deleted)
Primary Physician/Referring:  [name], MD  Patient ID: [name], male    DOB: 24-Jul-1940, 80 y.o.   MRN: [id number]  No chief complaint on file.  HPI:    [name]  is a 80 y.o. [place] male with coronary artery disease status post PTCA to diagonal 2 for acute MI in 2005, hypertension, hyperlipidemia, diabetes mellitus who  angiography on 05/11/2017, showed mild proximal RCA disease, moderate disease in true LAD and circumflex with negative FFR, patent D2 angioplasty site. It was felt the chest pain was likely due to esophageal spasm or GERD.   ***Patient presents for 6 week follow up of hypertension. At last visit added hydralazine 25 mg BID with additional dose if needed. Since last visit patient underwent nuclear stress test revealing moderate peri-infarct ischemia, I have discussed this with Dr. Einar Gip who recommends continued medical management.   ***  Patient presents for follow up of hypertension.  Patient's daughter is present at the bedside, she acts as Optometrist.  At last visit (annual visit) increased amlodipine to 10 mg daily and obtained echocardiogram in view of worsening dyspnea on exertion.  Patient reports mild improvement of dyspnea on exertion, however continues to experience this particularly when outside in cold weather.  Denies chest pain, palpitations, syncope, near syncope, leg swelling.  Patient has been monitoring his blood pressure regularly at home, and it remains elevated above goal despite increased amlodipine dose.  Past Medical History:  Diagnosis Date  . Coronary artery disease    balloon angioplasty of diagonal branch of LAD in 2005  . DM2 (diabetes mellitus, type 2) ([place])   . Dyslipidemia   . GERD (gastroesophageal reflux disease)   . History of nuclear stress test 10/21/2009   dipyridamole; mod perfusion defect due to infarct/scar with mild periinfarct ischemia in mid ant/apical ant/mid anterolateral/apical lateral; no significant ischemia    . Hx of pulmonary embolus   . Hyperglycemia 03/31/2018  . Hypertension   . Ischemic cardiomyopathy    h/o  . MI (myocardial infarction) ([place]) 07/09/2003   anterior   . Mixed hyperlipidemia 03/31/2018  . OA (osteoarthritis)   . 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 Unstable angina ([place]) 05/11/2017   Past Surgical History:  Procedure Laterality Date  . CARDIAC CATHETERIZATION  07/08/2003   2.25x20 [place] balloon angioplasty of diagonal 2 branch of LAD (Dr. Lowella Dell)  . CARDIAC CATHETERIZATION  03/25/2004   diagonal vessel patent w/40% restenosis, 50-60% mid LAD stenosis (Dr. Jackie Plum)  . CARDIAC CATHETERIZATION  07/23/2006   no significant change from 2006 cath (Dr. Domenic Moras)   . INTRAVASCULAR PRESSURE WIRE/FFR STUDY N/A 05/11/2017   Procedure: INTRAVASCULAR PRESSURE WIRE/FFR STUDY;  Surgeon: Nigel Mormon, MD;  Location: [facility];  Service: Cardiovascular;  Laterality: N/A;  . LEFT HEART CATH AND CORONARY ANGIOGRAPHY N/A 05/11/2017   Procedure: LEFT HEART CATH AND CORONARY ANGIOGRAPHY;  Surgeon: Nigel Mormon, MD;  Location: [facility];  Service: Cardiovascular;  Laterality: N/A;  . TRANSTHORACIC ECHOCARDIOGRAM  05/24/2011   EF 55-60%, mild LVH, grade 1 diastolic dysfunction; AV sclerosis; normal CVP    Social History   Tobacco Use  . Smoking status: Former Smoker    Quit date: 12/28/1994    Years since quitting: 25.3  . Smokeless tobacco: Never Used  Substance Use Topics  . Alcohol use: No  Marital Status: Married    ROS  Review of Systems  Constitutional: Negative for malaise/fatigue and weight  gain.  Cardiovascular: Positive for dyspnea on exertion. Negative for chest pain, claudication, leg swelling, near-syncope, orthopnea, palpitations, paroxysmal nocturnal dyspnea and syncope.  Respiratory: Negative for shortness of breath.   Hematologic/Lymphatic: Does not bruise/bleed easily.  Gastrointestinal: Negative for melena.  Neurological: Negative for  dizziness and weakness.   Objective  There were no vitals taken for this visit.  Vitals with BMI 03/12/2020 01/26/2020 01/26/2020  Height 5' 4" - 5' 4"  Weight 169 lbs - 175 lbs  BMI 26.83 - 41.96  Systolic 222 979 892  Diastolic 69 83 80  Pulse 58 55 64    No data found.  Physical Exam Vitals reviewed.  Constitutional:      Comments: Moderately built and well-nourished in no acute distress.  Appears younger than stated age.  HENT:     Head: Normocephalic and atraumatic.  Neck:     Thyroid: No thyromegaly.     Vascular: No JVD.  Cardiovascular:     Rate and Rhythm: Normal rate and regular rhythm.     Pulses: Intact distal pulses.     Heart sounds: Normal heart sounds, S1 normal and S2 normal. No murmur heard. No gallop.      Comments: No leg edema, no JVD. Pulmonary:     Effort: Pulmonary effort is normal. No respiratory distress.     Breath sounds: Normal breath sounds. No wheezing, rhonchi or rales.  Abdominal:     General: Bowel sounds are normal.     Palpations: Abdomen is soft.  Musculoskeletal:        General: Normal range of motion.     Cervical back: Neck supple.     Right lower leg: No edema.     Left lower leg: No edema.  Skin:    General: Skin is warm and dry.  Neurological:     Mental Status: He is alert.    Laboratory examination:    No results for input(s): NA, K, CL, CO2, GLUCOSE, BUN, CREATININE, CALCIUM, GFRNONAA, GFRAA in the last 8760 hours. CrCl cannot be calculated (Patient's most recent lab result is older than the maximum 21 days allowed.).  CMP Latest Ref Rng & Units 07/18/2018 07/16/2018 07/14/2018  Glucose 70 - 99 mg/dL 256(H) 211(H) 94  BUN 8 - 23 mg/dL 22 28(H) 17  Creatinine 0.61 - 1.24 mg/dL 1.39(H) 1.76(H) 1.55(H)  Sodium 135 - 145 mmol/L 136 131(L) 136  Potassium 3.5 - 5.1 mmol/L 4.8 4.2 4.3  Chloride 98 - 111 mmol/L 105 103 100  CO2 22 - 32 mmol/L 21(L) 20(L) 22  Calcium 8.9 - 10.3 mg/dL 9.0 7.8(L) 8.9  Total Protein 6.5 - 8.1 g/dL  - - 7.5  Total Bilirubin 0.3 - 1.2 mg/dL - - 0.7  Alkaline Phos 38 - 126 U/L - - 108  AST 15 - 41 U/L - - 34  ALT 0 - 44 U/L - - 35   CBC Latest Ref Rng & Units 07/16/2018 07/14/2018 07/13/2018  WBC 4.0 - 10.5 K/uL 5.4 4.6 5.3  Hemoglobin 13.0 - 17.0 g/dL 11.5(L) 12.5(L) 11.8(L)  Hematocrit 39.0 - 52.0 % 35.0(L) 37.6(L) 36.4(L)  Platelets 150 - 400 K/uL 175 151 138(L)   HEMOGLOBIN A1C Lab Results  Component Value Date   HGBA1C 6.2 (H) 07/16/2018   MPG 131.24 07/16/2018   TSH No results for input(s): TSH in the last 8760 hours.   External labs:   Cholesterol, total 151.000 m 01/23/2020 HDL 51.000 mg 01/23/2020 LDL-C 69.000 mg 01/23/2020 Triglycerides 188.000 m  01/23/2020  A1C 7.100 % 01/23/2020  Hemoglobin 13.500 g/d 08/08/2019  Creatinine, Serum 1.600 mg/ 01/23/2020 Potassium 4.300 mm 01/23/2020 ALT (SGPT) 22.000 U/L 01/23/2020   02/01/2018: Hemoglobin A1c 7.6%. Creatinine 1.42, EGFR 48/58, potassium 4.3, CMP otherwise normal. Cholesterol 165, triglycerides 144, HDL 47, LDL 89.  Medications and allergies   Allergies  Allergen Reactions  . Ace Inhibitors Cough  . Lipitor [Atorvastatin Calcium] Other (See Comments)    Muscle aches  . Simvastatin Other (See Comments)    Muscle aches  . Advicor [Niacin-Lovastatin Er] Rash    Current Outpatient Medications on File Prior to Visit  Medication Sig Dispense Refill  . amLODipine (NORVASC) 10 MG tablet TAKE 1 TABLET BY MOUTH EVERY DAY AT BREAKFAST FOR BLOOD PRESSURE 90 tablet 3  . aspirin 81 MG chewable tablet Chew 1 tablet (81 mg total) by mouth daily. 60 tablet 6  . colchicine 0.6 MG tablet Take 0.6 mg by mouth continuous as needed. Take 0.6 mg by mouth every eight hours until acute gout flare subsides  0  . glimepiride (AMARYL) 1 MG tablet Take 1 tablet by mouth daily. On hold    . glucosamine-chondroitin 500-400 MG tablet Take 1 tablet by mouth daily.     . isosorbide mononitrate (IMDUR) 60 MG 24 hr tablet Take 60 mg by  mouth daily.    Marland Kitchen labetalol (NORMODYNE) 200 MG tablet TAKE ONE TABLET BY MOUTH AT BREAKFAST AND AT BEDTIME 180 tablet 2  . losartan (COZAAR) 50 MG tablet Take 50 mg by mouth at bedtime.    . Multiple Vitamins-Minerals (CENTRUM SILVER 50+MEN PO) 1 tablet    . Multiple Vitamins-Minerals (CENTRUM SILVER 50+MEN) TABS Take 1 tablet by mouth daily.    . nitroGLYCERIN (NITROSTAT) 0.4 MG SL tablet Place 1 tablet (0.4 mg total) under the tongue every 5 (five) minutes as needed for chest pain. 25 tablet 2  . ondansetron (ZOFRAN) 8 MG tablet Take 8 mg by mouth every 8 (eight) hours as needed for nausea or vomiting.     . pantoprazole (PROTONIX) 40 MG tablet Take 40 mg by mouth daily.    . polyethylene glycol (MIRALAX / GLYCOLAX) 17 g packet 1 packet mixed with 8 ounces of fluid    . rosuvastatin (CRESTOR) 40 MG tablet Take 20 mg by mouth daily.     . simethicone (MYLICON) 80 MG chewable tablet 1 tablet after meals and at bedtime as needed    . vitamin C (VITAMIN C) 500 MG tablet Take 1 tablet (500 mg total) by mouth 2 (two) times daily. 60 tablet 0   No current facility-administered medications on file prior to visit.     Radiology:   Chest X-Ray 01/07/2014: Normal chest x-ray.  Mild cardiomegaly.  No active cardiopulmonary disease.  Cardiac Studies:   Nuclear Stress Test, exercise myoview 10/18/2012: 1. Resting EKG showed normal sinus rhythm, poor R wave progression. Stress EKG was negative for ischemia. Occasional PVC noted in recovery. Patient exercised on Bruce protocol for 7 minutes. The maximum work level achieved was 7.8 MET's. Hypertensive at rest and stress. The test was terminated due to achievement of the target heart rate and fatigue.  2. Perfusion images reveal scar in anterior, antero apical and apical lateral wall. There is no significant difference in comparison of stress and rest images on polar display. Dynamic gated images reveal anterior wall hypokinesis and endocardial thickening.  Left ventricular ejection fraction was estimated to be 33%. This is a low risk study.  Coronary  angiogram 05/11/2017: Moderate nonobstructive coronary artery disease. No true left main. Ostial 50%, mid 30-40% stenoses. FFR 0.83. Medium sized diagonal with diffuse 60% disease Ostial LCx 40% with resting Pd/Pa 0.98. Mid 20-30% disease Prox RCA 40% stenosis. Normal LVEDP, LVEF 55-65%.  Echocardiogram 01/25/2018: Left ventricle cavity is normal in size. Mild concentric hypertrophy of the left ventricle. Normal global wall motion. Doppler evidence of grade II (pseudonormal) diastolic dysfunction. Diastolic dysfunction findings suggests elevated LA/LV end diastolic pressure. Calculated EF 55%. Trace tricuspid regurgitation. Mild to moderate pulmonic regurgitation. Compared to 01/16/2013, no significant change. Diastolic dysfunction was grade I.  PCV ECHOCARDIOGRAM COMPLETE 08/65/7846 Normal LV systolic function with visual EF 55-60%. Left ventricle cavity is normal in size. Normal global wall motion. Normal diastolic filling pattern, indeterminate LAP. Mild (Grade I) aortic regurgitation. Mild (Grade I) mitral regurgitation. Moderate pulmonic regurgitation. Compared to prior study dated 01/25/2018 no significant change.  PCV MYOCARDIAL PERFUSION WO LEXISCAN 03/24/2020 Normal ECG stress. The patient exercised for 5 minutes and 18 seconds of a Bruce protocol, achieving approximately 7.05 METs.  Stress terminated due to dyspnea. Resting EKG normal sinus rhythm, stress EKG no ST-T wave changes with stress testing.  Frequent PVCs were evident through the exercise and in recovery. Myocardial perfusion is abnormal. There is a moderate-sized scar noted in the anterior and anterolateral and apical lateral region with moderate peri-infarct ischemia. Gated SPECT imaging of the left ventricle was abnormal, demonstrating akinesis of the apical anterior wall, apical lateral wall and mid anterior wall.  Stress LV EF is  moderately dysfunctional 37%. Compared to 10/18/2012, moderate peri-infarct ischemia is new.  Previously scar was noted in similar region.  Intermediate risk.  EKG   EKG 01/26/2020: Normal sinus rhythm at rate of 55 bpm, left axis deviation, left intrafascicular block.  Nonspecific T abnormality.  Normal QT interval.  No significant change from 01/24/2019, sinus bradycardia new, previously heart rate 61 bpm.   Assessment     ICD-10-CM   1. Essential hypertension  I10   2. Coronary artery disease of native artery of native heart with stable angina pectoris ([place])  I25.118     No orders of the defined types were placed in this encounter.   There are no discontinued medications.    Recommendations:   [name]  is a 80 y.o.  [place] male with coronary artery disease status post PTCA to diagonal 2 for acute MI in 2005, hypertension, hyperlipidemia, diabetes mellitus who  angiography on 05/11/2017, showed mild proximal RCA disease, moderate disease in true LAD and circumflex with negative FFR, patent D2 angioplasty site. It was felt the chest pain was likely due to esophageal spasm or GERD.   ***Patient presents for 6 week follow up of hypertension. At last visit added hydralazine 25 mg BID with additional dose if needed. Since last visit patient underwent nuclear stress test revealing moderate peri-infarct ischemia, I have discussed this with Dr. Einar Gip who recommends continued medical management.   ***  ***  Patient presents for 6-week follow-up of hypertension and dyspnea on exertion.  Patient's blood pressure remains elevated above goal despite increased dose of amlodipine at last visit.  Therefore will start patient on hydralazine 25 mg twice daily.  Advised patient to check his blood pressure and if it is >140/90 mmHg he may take additional dose of hydralazine.  Patient and his daughter both verbalized understanding.  Discussed and reviewed with patient results of echocardiogram  which revealed normal LVEF, normal diastolic filling pattern and mild MR,  mild AR, and moderate pulmonic regurgitation, no significant change compared to study in 2019.  As patient continues to have dyspnea on exertion shared decision with patient and his daughter was to proceed with nuclear stress testing to evaluate for underlying ischemia.  Discussed again regarding importance of low-salt diet for blood pressure control.  Follow-up in 6 weeks for hypertension and results of cardiac testing.     Alethia Berthold, PA-C 04/22/2020, 1:07 PM Office: [phone]

## 2020-04-23 ENCOUNTER — Ambulatory Visit: Payer: Medicare Other | Admitting: Student

## 2020-05-12 NOTE — Progress Notes (Signed)
Primary Physician/Referring:  [name], MD  Patient ID: [name], male    DOB: [birth year], 80 y.o.   MRN: [id number]  Chief Complaint  Patient presents with  . Hypertension  . Coronary Artery Disease  . Follow-up    6 week   HPI:    [name]  is a 80 y.o. [place] male with coronary artery disease status post PTCA to diagonal 2 for acute MI in 2005, hypertension, hyperlipidemia, diabetes mellitus who  angiography on 05/11/2017, showed mild proximal RCA disease, moderate disease in true LAD and circumflex with negative FFR, patent D2 angioplasty site. It was felt the chest pain was likely due to esophageal spasm or GERD.   Patient presents for 6 week follow up of hypertension.  Patient's daughter is present at bedside and acts as interpreter for visit.  At last visit added hydralazine 25 mg BID with additional dose if needed, however when patient started this medication he noticed his blood pressure was increasing.  Therefore patient stopped hydralazine and has not been taking it.  Patient has made significant diet modifications, cutting out most salt.  His daughter has been monitoring his blood pressure on a daily basis manually as she is a CMA, and reports home blood pressure readings are under excellent control averaging 130/70-80 mmHg.  Patient denies chest pain, dyspnea, syncope, near syncope, palpitations.  Denies leg swelling, orthopnea, PND.  Since last visit patient underwent nuclear stress test revealing moderate peri-infarct ischemia, I have discussed this with Dr. Einar Gip who recommends continued medical management.    Past Medical History:  Diagnosis Date  . Coronary artery disease    balloon angioplasty of diagonal branch of LAD in 2005  . DM2 (diabetes mellitus, type 2) ([place])   . Dyslipidemia   . GERD (gastroesophageal reflux disease)   . History of nuclear stress test 10/21/2009   dipyridamole; mod perfusion defect due to infarct/scar with mild periinfarct  ischemia in mid ant/apical ant/mid anterolateral/apical lateral; no significant ischemia   . Hx of pulmonary embolus   . Hyperglycemia 03/31/2018  . Hypertension   . Ischemic cardiomyopathy    h/o  . MI (myocardial infarction) ([place]) 07/09/2003   anterior   . Mixed hyperlipidemia 03/31/2018  . OA (osteoarthritis)   . 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 Unstable angina ([place]) 05/11/2017  History reviewed. No pertinent family history. No known family history of premature CAD or sudden cardiac death. Past Surgical History:  Procedure Laterality Date  . CARDIAC CATHETERIZATION  07/08/2003   2.25x20 [place] balloon angioplasty of diagonal 2 branch of LAD (Dr. Lowella Dell)  . CARDIAC CATHETERIZATION  03/25/2004   diagonal vessel patent w/40% restenosis, 50-60% mid LAD stenosis (Dr. Jackie Plum)  . CARDIAC CATHETERIZATION  07/23/2006   no significant change from 2006 cath (Dr. Domenic Moras)   . INTRAVASCULAR PRESSURE WIRE/FFR STUDY N/A 05/11/2017   Procedure: INTRAVASCULAR PRESSURE WIRE/FFR STUDY;  Surgeon: Nigel Mormon, MD;  Location: [facility];  Service: Cardiovascular;  Laterality: N/A;  . LEFT HEART CATH AND CORONARY ANGIOGRAPHY N/A 05/11/2017   Procedure: LEFT HEART CATH AND CORONARY ANGIOGRAPHY;  Surgeon: Nigel Mormon, MD;  Location: [facility];  Service: Cardiovascular;  Laterality: N/A;  . TRANSTHORACIC ECHOCARDIOGRAM  05/24/2011   EF 55-60%, mild LVH, grade 1 diastolic dysfunction; AV sclerosis; normal CVP   Social History   Tobacco Use  . Smoking status: Former Smoker    Types: Cigarettes    Quit date:  12/28/1994    Years since quitting: 25.3  . Smokeless tobacco: Never Used  Substance Use Topics  . Alcohol use: No  Marital Status: Married    ROS  Review of Systems  Constitutional: Negative for malaise/fatigue and weight gain.  Cardiovascular: Negative for chest pain, claudication, dyspnea on exertion, leg swelling, near-syncope, orthopnea, palpitations,  paroxysmal nocturnal dyspnea and syncope.  Respiratory: Negative for shortness of breath.   Hematologic/Lymphatic: Does not bruise/bleed easily.  Gastrointestinal: Negative for melena.  Neurological: Negative for dizziness and weakness.   Objective  Blood pressure 138/72, pulse 64, temperature (!) 97.5 F (36.4 C), resp. rate 16, height _0  (1.626 m), weight 173 lb (78.5 kg), SpO2 97 %.  Vitals with BMI 05/14/2020 03/12/2020 01/26/2020  Height _1  _2  -  Weight 173 lbs 169 lbs -  BMI 32.20 25.42 -  Systolic 706 237 628  Diastolic 72 69 83  Pulse 64 58 55    Physical Exam Vitals reviewed.  Constitutional:      Comments: Moderately built and well-nourished in no acute distress.  Appears younger than stated age.  HENT:     Head: Normocephalic and atraumatic.  Neck:     Thyroid: No thyromegaly.     Vascular: No JVD.  Cardiovascular:     Rate and Rhythm: Normal rate and regular rhythm.     Pulses: Intact distal pulses.     Heart sounds: Normal heart sounds, S1 normal and S2 normal. No murmur heard. No gallop.      Comments: No leg edema, no JVD. Pulmonary:     Effort: Pulmonary effort is normal. No respiratory distress.     Breath sounds: Normal breath sounds. No wheezing, rhonchi or rales.  Musculoskeletal:        General: Normal range of motion.     Cervical back: Neck supple.     Right lower leg: No edema.     Left lower leg: No edema.  Neurological:     Mental Status: He is alert.    Laboratory examination:    No results for input(s): NA, K, CL, CO2, GLUCOSE, BUN, CREATININE, CALCIUM, GFRNONAA, GFRAA in the last 8760 hours. CrCl cannot be calculated (Patient's most recent lab result is older than the maximum 21 days allowed.).  CMP Latest Ref Rng & Units 07/18/2018 07/16/2018 07/14/2018  Glucose 70 - 99 mg/dL 256(H) 211(H) 94  BUN 8 - 23 mg/dL 22 28(H) 17  Creatinine 0.61 - 1.24 mg/dL 1.39(H) 1.76(H) 1.55(H)  Sodium 135 - 145 mmol/L 136 131(L) 136  Potassium 3.5 -  5.1 mmol/L 4.8 4.2 4.3  Chloride 98 - 111 mmol/L 105 103 100  CO2 22 - 32 mmol/L 21(L) 20(L) 22  Calcium 8.9 - 10.3 mg/dL 9.0 7.8(L) 8.9  Total Protein 6.5 - 8.1 g/dL - - 7.5  Total Bilirubin 0.3 - 1.2 mg/dL - - 0.7  Alkaline Phos 38 - 126 U/L - - 108  AST 15 - 41 U/L - - 34  ALT 0 - 44 U/L - - 35   CBC Latest Ref Rng & Units 07/16/2018 07/14/2018 07/13/2018  WBC 4.0 - 10.5 K/uL 5.4 4.6 5.3  Hemoglobin 13.0 - 17.0 g/dL 11.5(L) 12.5(L) 11.8(L)  Hematocrit 39.0 - 52.0 % 35.0(L) 37.6(L) 36.4(L)  Platelets 150 - 400 K/uL 175 151 138(L)   HEMOGLOBIN A1C Lab Results  Component Value Date   HGBA1C 6.2 (H) 07/16/2018   MPG 131.24 07/16/2018   TSH No results for input(s): TSH in the  last 8760 hours.   External labs:   Cholesterol, total 151.000 m 01/23/2020 HDL 51.000 mg 01/23/2020 LDL-C 69.000 mg 01/23/2020 Triglycerides 188.000 m 01/23/2020  A1C 7.100 % 01/23/2020  Hemoglobin 13.500 g/d 08/08/2019  Creatinine, Serum 1.600 mg/ 01/23/2020 Potassium 4.300 mm 01/23/2020 ALT (SGPT) 22.000 U/L 01/23/2020   02/01/2018: Hemoglobin A1c 7.6%. Creatinine 1.42, EGFR 48/58, potassium 4.3, CMP otherwise normal. Cholesterol 165, triglycerides 144, HDL 47, LDL 89.  Medications and allergies   Allergies  Allergen Reactions  . Ace Inhibitors Cough  . Lipitor [Atorvastatin Calcium] Other (See Comments)    Muscle aches  . Simvastatin Other (See Comments)    Muscle aches  . Advicor [Niacin-Lovastatin Er] Rash    Current Outpatient Medications on File Prior to Visit  Medication Sig Dispense Refill  . amLODipine (NORVASC) 10 MG tablet TAKE 1 TABLET BY MOUTH EVERY DAY AT BREAKFAST FOR BLOOD PRESSURE 90 tablet 3  . aspirin 81 MG chewable tablet Chew 1 tablet (81 mg total) by mouth daily. 60 tablet 6  . colchicine 0.6 MG tablet Take 0.6 mg by mouth continuous as needed. Take 0.6 mg by mouth every eight hours until acute gout flare subsides  0  . glimepiride (AMARYL) 1 MG tablet Take 1 tablet by  mouth daily. On hold    . glucosamine-chondroitin 500-400 MG tablet Take 1 tablet by mouth daily.     . isosorbide mononitrate (IMDUR) 60 MG 24 hr tablet Take 60 mg by mouth daily.    Marland Kitchen labetalol (NORMODYNE) 200 MG tablet TAKE ONE TABLET BY MOUTH AT BREAKFAST AND AT BEDTIME 180 tablet 2  . losartan (COZAAR) 50 MG tablet Take 50 mg by mouth at bedtime.    . Multiple Vitamins-Minerals (CENTRUM SILVER 50+MEN PO) 1 tablet    . nitroGLYCERIN (NITROSTAT) 0.4 MG SL tablet Place 1 tablet (0.4 mg total) under the tongue every 5 (five) minutes as needed for chest pain. 25 tablet 2  . ondansetron (ZOFRAN) 8 MG tablet Take 8 mg by mouth every 8 (eight) hours as needed for nausea or vomiting.     . pantoprazole (PROTONIX) 40 MG tablet Take 40 mg by mouth daily.    . polyethylene glycol (MIRALAX / GLYCOLAX) 17 g packet 1 packet mixed with 8 ounces of fluid    . rosuvastatin (CRESTOR) 40 MG tablet Take 20 mg by mouth daily.     . simethicone (MYLICON) 80 MG chewable tablet 1 tablet after meals and at bedtime as needed    . vitamin C (VITAMIN C) 500 MG tablet Take 1 tablet (500 mg total) by mouth 2 (two) times daily. 60 tablet 0   No current facility-administered medications on file prior to visit.     Radiology:   Chest X-Ray 01/07/2014: Normal chest x-ray.  Mild cardiomegaly.  No active cardiopulmonary disease.  Cardiac Studies:   Nuclear Stress Test, exercise myoview 10/18/2012: 1. Resting EKG showed normal sinus rhythm, poor R wave progression. Stress EKG was negative for ischemia. Occasional PVC noted in recovery. Patient exercised on Bruce protocol for 7 minutes. The maximum work level achieved was 7.8 MET's. Hypertensive at rest and stress. The test was terminated due to achievement of the target heart rate and fatigue.  2. Perfusion images reveal scar in anterior, antero apical and apical lateral wall. There is no significant difference in comparison of stress and rest images on polar display. Dynamic  gated images reveal anterior wall hypokinesis and endocardial thickening. Left ventricular ejection fraction was estimated to be  33%. This is a low risk study.  Coronary angiogram 05/11/2017: Moderate nonobstructive coronary artery disease. No true left main. Ostial 50%, mid 30-40% stenoses. FFR 0.83. Medium sized diagonal with diffuse 60% disease Ostial LCx 40% with resting Pd/Pa 0.98. Mid 20-30% disease Prox RCA 40% stenosis. Normal LVEDP, LVEF 55-65%.  Echocardiogram 01/25/2018: Left ventricle cavity is normal in size. Mild concentric hypertrophy of the left ventricle. Normal global wall motion. Doppler evidence of grade II (pseudonormal) diastolic dysfunction. Diastolic dysfunction findings suggests elevated LA/LV end diastolic pressure. Calculated EF 55%. Trace tricuspid regurgitation. Mild to moderate pulmonic regurgitation. Compared to 01/16/2013, no significant change. Diastolic dysfunction was grade I.  PCV ECHOCARDIOGRAM COMPLETE 36/62/9476 Normal LV systolic function with visual EF 55-60%. Left ventricle cavity is normal in size. Normal global wall motion. Normal diastolic filling pattern, indeterminate LAP. Mild (Grade I) aortic regurgitation. Mild (Grade I) mitral regurgitation. Moderate pulmonic regurgitation. Compared to prior study dated 01/25/2018 no significant change.  PCV MYOCARDIAL PERFUSION WO LEXISCAN 03/24/2020 Normal ECG stress. The patient exercised for 5 minutes and 18 seconds of a Bruce protocol, achieving approximately 7.05 METs.  Stress terminated due to dyspnea. Resting EKG normal sinus rhythm, stress EKG no ST-T wave changes with stress testing.  Frequent PVCs were evident through the exercise and in recovery. Myocardial perfusion is abnormal. There is a moderate-sized scar noted in the anterior and anterolateral and apical lateral region with moderate peri-infarct ischemia. Gated SPECT imaging of the left ventricle was abnormal, demonstrating akinesis of the apical  anterior wall, apical lateral wall and mid anterior wall.  Stress LV EF is moderately dysfunctional 37%. Compared to 10/18/2012, moderate peri-infarct ischemia is new.  Previously scar was noted in similar region.  Intermediate risk.  EKG   EKG 01/26/2020: Normal sinus rhythm at rate of 55 bpm, left axis deviation, left intrafascicular block.  Nonspecific T abnormality.  Normal QT interval.  No significant change from 01/24/2019, sinus bradycardia new, previously heart rate 61 bpm.   Assessment     ICD-10-CM   1. Essential hypertension  I10   2. Coronary artery disease of native artery of native heart with stable angina pectoris ([place])  I25.118     No orders of the defined types were placed in this encounter.   Medications Discontinued During This Encounter  Medication Reason  . Multiple Vitamins-Minerals (CENTRUM SILVER 50+MEN) TABS Error      Recommendations:   [name]  is a 80 y.o.  [place] male with coronary artery disease status post PTCA to diagonal 2 for acute MI in 2005, hypertension, hyperlipidemia, diabetes mellitus who  angiography on 05/11/2017, showed mild proximal RCA disease, moderate disease in true LAD and circumflex with negative FFR, patent D2 angioplasty site. It was felt the chest pain was likely due to esophageal spasm or GERD.   Patient presents for 6 week follow up of hypertension.  Patient was unable to tolerate previously prescribed hydralazine as his blood pressure was paradoxically increasing when he started the medication.  Since stopping the medication and significantly reducing salt intake, patient's home blood pressure readings are under excellent control.  Notably he is blood pressure was initially elevated in the office today, however upon recheck it had improved.  Congratulated patient on diet modifications and encouraged him to continue with medication compliance as well as low-salt diet.   Again discussed with patient and his daughter regarding  nuclear stress test revealing moderate peri-infarct ischemia, which I have discussed with Dr. Einar Gip who recommends continued medical management  as this is likely indicative of small vessel disease or microvascular disease and previous cardiac catheterization showed no significant obstructive CAD.  Patient is relatively asymptomatic and there are no clinical signs of heart failure.  His blood pressure is well controlled and I personally reviewed external labs which revealed lipids under excellent control as well.  Patient is stable from a cardiovascular standpoint.  Follow-up in 1 year, sooner if needed, for CAD, hypertension, and hyperlipidemia, as well as mild MR.   Alethia Berthold, PA-C 05/14/2020, 1:30 PM Office: [phone]

## 2020-05-14 ENCOUNTER — Ambulatory Visit: Payer: Medicare Other | Admitting: Student

## 2020-05-14 ENCOUNTER — Encounter: Payer: Self-pay | Admitting: Student

## 2020-05-14 ENCOUNTER — Other Ambulatory Visit: Payer: Self-pay

## 2020-05-14 VITALS — BP 138/72 | HR 64 | Temp 97.5°F | Resp 16 | Ht 64.0 in | Wt 173.0 lb

## 2020-05-14 DIAGNOSIS — I25118 Atherosclerotic heart disease of native coronary artery with other forms of angina pectoris: Secondary | ICD-10-CM

## 2020-05-14 DIAGNOSIS — I10 Essential (primary) hypertension: Secondary | ICD-10-CM | POA: Diagnosis not present

## 2020-05-20 DIAGNOSIS — I209 Angina pectoris, unspecified: Secondary | ICD-10-CM | POA: Diagnosis not present

## 2020-05-20 DIAGNOSIS — K219 Gastro-esophageal reflux disease without esophagitis: Secondary | ICD-10-CM | POA: Diagnosis not present

## 2020-05-20 DIAGNOSIS — I129 Hypertensive chronic kidney disease with stage 1 through stage 4 chronic kidney disease, or unspecified chronic kidney disease: Secondary | ICD-10-CM | POA: Diagnosis not present

## 2020-05-20 DIAGNOSIS — N183 Chronic kidney disease, stage 3 unspecified: Secondary | ICD-10-CM | POA: Diagnosis not present

## 2020-05-20 DIAGNOSIS — E782 Mixed hyperlipidemia: Secondary | ICD-10-CM | POA: Diagnosis not present

## 2020-05-20 DIAGNOSIS — I2510 Atherosclerotic heart disease of native coronary artery without angina pectoris: Secondary | ICD-10-CM | POA: Diagnosis not present

## 2020-05-20 DIAGNOSIS — I25119 Atherosclerotic heart disease of native coronary artery with unspecified angina pectoris: Secondary | ICD-10-CM | POA: Diagnosis not present

## 2020-05-20 DIAGNOSIS — E1129 Type 2 diabetes mellitus with other diabetic kidney complication: Secondary | ICD-10-CM | POA: Diagnosis not present

## 2020-05-25 ENCOUNTER — Emergency Department (HOSPITAL_COMMUNITY): Payer: Medicare Other

## 2020-05-25 ENCOUNTER — Encounter (HOSPITAL_COMMUNITY): Payer: Self-pay

## 2020-05-25 ENCOUNTER — Emergency Department (HOSPITAL_COMMUNITY)
Admission: EM | Admit: 2020-05-25 | Discharge: 2020-05-25 | Disposition: A | Discharge status: Home / Self Care | Payer: Medicare Other | Attending: Emergency Medicine | Admitting: Emergency Medicine

## 2020-05-25 DIAGNOSIS — I2510 Atherosclerotic heart disease of native coronary artery without angina pectoris: Secondary | ICD-10-CM | POA: Diagnosis not present

## 2020-05-25 DIAGNOSIS — Z8616 Personal history of COVID-19: Secondary | ICD-10-CM | POA: Insufficient documentation

## 2020-05-25 DIAGNOSIS — R42 Dizziness and giddiness: Secondary | ICD-10-CM | POA: Diagnosis not present

## 2020-05-25 DIAGNOSIS — Z87891 Personal history of nicotine dependence: Secondary | ICD-10-CM | POA: Diagnosis not present

## 2020-05-25 DIAGNOSIS — J181 Lobar pneumonia, unspecified organism: Secondary | ICD-10-CM | POA: Diagnosis not present

## 2020-05-25 DIAGNOSIS — I129 Hypertensive chronic kidney disease with stage 1 through stage 4 chronic kidney disease, or unspecified chronic kidney disease: Secondary | ICD-10-CM | POA: Insufficient documentation

## 2020-05-25 DIAGNOSIS — R519 Headache, unspecified: Secondary | ICD-10-CM | POA: Diagnosis not present

## 2020-05-25 DIAGNOSIS — Z20822 Contact with and (suspected) exposure to covid-19: Secondary | ICD-10-CM | POA: Insufficient documentation

## 2020-05-25 DIAGNOSIS — N183 Chronic kidney disease, stage 3 unspecified: Secondary | ICD-10-CM | POA: Insufficient documentation

## 2020-05-25 DIAGNOSIS — H538 Other visual disturbances: Secondary | ICD-10-CM | POA: Diagnosis not present

## 2020-05-25 DIAGNOSIS — Z955 Presence of coronary angioplasty implant and graft: Secondary | ICD-10-CM | POA: Insufficient documentation

## 2020-05-25 DIAGNOSIS — Z743 Need for continuous supervision: Secondary | ICD-10-CM | POA: Diagnosis not present

## 2020-05-25 DIAGNOSIS — R404 Transient alteration of awareness: Secondary | ICD-10-CM | POA: Diagnosis not present

## 2020-05-25 DIAGNOSIS — E119 Type 2 diabetes mellitus without complications: Secondary | ICD-10-CM | POA: Diagnosis not present

## 2020-05-25 DIAGNOSIS — R0902 Hypoxemia: Secondary | ICD-10-CM | POA: Diagnosis not present

## 2020-05-25 DIAGNOSIS — R062 Wheezing: Secondary | ICD-10-CM | POA: Diagnosis not present

## 2020-05-25 DIAGNOSIS — J189 Pneumonia, unspecified organism: Secondary | ICD-10-CM

## 2020-05-25 DIAGNOSIS — R0602 Shortness of breath: Secondary | ICD-10-CM | POA: Diagnosis not present

## 2020-05-25 LAB — DIFFERENTIAL
Abs Immature Granulocytes: 0.03 10*3/uL (ref 0.00–0.07)
Basophils Absolute: 0 10*3/uL (ref 0.0–0.1)
Basophils Relative: 0 %
Eosinophils Absolute: 0.1 10*3/uL (ref 0.0–0.5)
Eosinophils Relative: 1 %
Immature Granulocytes: 0 %
Lymphocytes Relative: 12 %
Lymphs Abs: 1.1 10*3/uL (ref 0.7–4.0)
Monocytes Absolute: 0.8 10*3/uL (ref 0.1–1.0)
Monocytes Relative: 9 %
Neutro Abs: 6.6 10*3/uL (ref 1.7–7.7)
Neutrophils Relative %: 78 %

## 2020-05-25 LAB — PROTIME-INR
INR: 1.1 (ref 0.8–1.2)
Prothrombin Time: 14 seconds (ref 11.4–15.2)

## 2020-05-25 LAB — CBC
HCT: 37.7 % — ABNORMAL LOW (ref 39.0–52.0)
Hemoglobin: 12.6 g/dL — ABNORMAL LOW (ref 13.0–17.0)
MCH: 27.1 pg (ref 26.0–34.0)
MCHC: 33.4 g/dL (ref 30.0–36.0)
MCV: 81.1 fL (ref 80.0–100.0)
Platelets: 159 10*3/uL (ref 150–400)
RBC: 4.65 MIL/uL (ref 4.22–5.81)
RDW: 13.1 % (ref 11.5–15.5)
WBC: 8.6 10*3/uL (ref 4.0–10.5)
nRBC: 0 % (ref 0.0–0.2)

## 2020-05-25 LAB — TROPONIN I (HIGH SENSITIVITY)
Troponin I (High Sensitivity): 9 ng/L (ref <18)
Troponin I (High Sensitivity): 9 ng/L (ref <18)

## 2020-05-25 LAB — COMPREHENSIVE METABOLIC PANEL
ALT: 29 U/L (ref 0–44)
AST: 27 U/L (ref 15–41)
Albumin: 4.1 g/dL (ref 3.5–5.0)
Alkaline Phosphatase: 81 U/L (ref 38–126)
Anion gap: 7 (ref 5–15)
BUN: 13 mg/dL (ref 8–23)
CO2: 26 mmol/L (ref 22–32)
Calcium: 8.9 mg/dL (ref 8.9–10.3)
Chloride: 105 mmol/L (ref 98–111)
Creatinine, Ser: 1.54 mg/dL — ABNORMAL HIGH (ref 0.61–1.24)
GFR, Estimated: 45 mL/min — ABNORMAL LOW (ref >60)
Glucose, Bld: 173 mg/dL — ABNORMAL HIGH (ref 70–99)
Potassium: 4.2 mmol/L (ref 3.5–5.1)
Sodium: 138 mmol/L (ref 135–145)
Total Bilirubin: 0.8 mg/dL (ref 0.3–1.2)
Total Protein: 7.3 g/dL (ref 6.5–8.1)

## 2020-05-25 LAB — CBG MONITORING, ED: Glucose-Capillary: 179 mg/dL — ABNORMAL HIGH (ref 70–99)

## 2020-05-25 LAB — BRAIN NATRIURETIC PEPTIDE: B Natriuretic Peptide: 81.9 pg/mL (ref 0.0–100.0)

## 2020-05-25 LAB — RESP PANEL BY RT-PCR (FLU A&B, COVID) ARPGX2
Influenza A by PCR: NEGATIVE
Influenza B by PCR: NEGATIVE
SARS Coronavirus 2 by RT PCR: NEGATIVE

## 2020-05-25 LAB — I-STAT BETA HCG BLOOD, ED (MC, WL, AP ONLY): I-stat hCG, quantitative: <5 m[IU]/mL (ref <5)

## 2020-05-25 LAB — APTT: aPTT: 34 seconds (ref 24–36)

## 2020-05-25 MED ORDER — AZITHROMYCIN 250 MG PO TABS: ORAL_TABLET | ORAL | 0 refills | Status: DC

## 2020-05-25 MED ORDER — CEPHALEXIN 500 MG PO CAPS: 500.0000 mg | ORAL_CAPSULE | Freq: Three times a day (TID) | ORAL | 0 refills | Status: DC

## 2020-05-25 MED ORDER — ACETAMINOPHEN 500 MG PO TABS
1000.0000 mg | ORAL_TABLET | Freq: Once | ORAL | Status: AC
  Administered 2020-05-25: 1000 mg via ORAL
  Filled 2020-05-25: qty 2

## 2020-05-25 MED ORDER — SODIUM CHLORIDE 0.9% FLUSH: 3.0000 mL | Freq: Once | INTRAVENOUS | Status: DC

## 2020-05-25 MED ORDER — CEPHALEXIN 250 MG PO CAPS
1000.0000 mg | ORAL_CAPSULE | Freq: Once | ORAL | Status: AC
  Administered 2020-05-25: 1000 mg via ORAL
  Filled 2020-05-25: qty 4

## 2020-05-25 MED ORDER — PROMETHAZINE-CODEINE 6.25-10 MG/5ML PO SOLN: 5.0000 mL | Freq: Two times a day (BID) | ORAL | 0 refills | Status: DC | PRN

## 2020-05-25 NOTE — ED Triage Notes (Signed)
Pt BIB daughter due to dizziness, blurred vision, headache,sob, cough. Daughter states it started yesterday. Daughter stated that  Pt has h/o MI. Pt neuro intact at this time.

## 2020-05-25 NOTE — ED Notes (Signed)
Patient verbalizes understanding of discharge instructions. Opportunity for questioning and answers were provided. Armband removed by staff, pt discharged from ED.  

## 2020-05-25 NOTE — ED Notes (Signed)
Patient transported to X-ray 

## 2020-05-25 NOTE — ED Provider Notes (Signed)
  Physical Exam  BP (!) 148/80   Pulse 82   Temp 98.3 F (36.8 C) (Oral)   Resp (!) 27   SpO2 94%   Physical Exam  ED Course/Procedures     Procedures  MDM  Patient is here for shortness of breath and cough and dizziness.  Patient only speaks [place] and daughter is at bedside translating.  Chest x-ray showed possible pneumonia.  Patient has no obvious focal neuro deficits to suggest stroke Signout pending CT head and is if is negative and patient does not have any focal deficit, may not need an MRI.  Also signout pending labs  4:28 PM Patient's white blood cell count is normal.  Covid test is negative and troponin is normal.  He has some productive cough but is not hypoxic.  At this point he is not septic.  Will discharge patient home with Keflex and azithromycin.  Daughter request that I represcribe his cough medicine (promethazine with codeine).  Stable for discharge and told him to follow-up with his doctor     Drenda Freeze, MD 05/25/20 [phone]

## 2020-05-25 NOTE — Discharge Instructions (Addendum)
Stay hydrated.  Take Keflex and azithromycin for pneumonia.  Please take promethazine with codeine for cough  See your doctor for follow up  Return to ER if you have worse shortness of breath, dizziness, cough, trouble breathing

## 2020-05-25 NOTE — ED Provider Notes (Signed)
Emergency Department Provider Note   I have reviewed the triage vital signs and the nursing notes.   HISTORY  Chief Complaint Shortness of Breath and Blurred Vision   HPI [name] is a 80 y.o. male with past medical history reviewed below presents to the emergency department with atypical headache, blurry vision, feeling off balance with associated shortness of breath and cough.  Patient symptoms began yesterday mainly with intermittent pressure-like headache.  Headache is diffuse.  No sudden onset/maximal intensity headache symptom.  He has not had fevers.  No unilateral numbness or weakness.  No abdominal pain, vomiting, diarrhea.  His headache continued this morning and when his daughter called him back at noon he was feeling off balance and felt like he was having blurry vision in both eyes.  Denies any double vision.  He does not drink alcohol or use drugs.  He has had some persistent cough and has been feeling more shortness of breath especially over the past couple of days.  No chest pain.  Shortness of breath is worse with exertion.   [place] interpreter/family member used for interpretation.   Past Medical History:  Diagnosis Date  . Coronary artery disease    balloon angioplasty of diagonal branch of LAD in 2005  . DM2 (diabetes mellitus, type 2) ([place])   . Dyslipidemia   . GERD (gastroesophageal reflux disease)   . History of nuclear stress test 10/21/2009   dipyridamole; mod perfusion defect due to infarct/scar with mild periinfarct ischemia in mid ant/apical ant/mid anterolateral/apical lateral; no significant ischemia   . Hx of pulmonary embolus   . Hyperglycemia 03/31/2018  . Hypertension   . Ischemic cardiomyopathy    h/o  . MI (myocardial infarction) ([place]) 07/09/2003   anterior   . Mixed hyperlipidemia 03/31/2018  . OA (osteoarthritis)   . 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 Unstable angina ([place]) 05/11/2017    Patient Active Problem List   Diagnosis Date Noted  .  Multifocal pneumonia 07/17/2018  . Near syncope 07/17/2018  . Chronic anemia 07/17/2018  . CKD (chronic kidney disease), stage III ([place])   . Syncope 07/13/2018  . COVID-19 virus infection 07/13/2018  . Renal insufficiency 07/13/2018  . Diabetes mellitus type 2, noninsulin dependent ([place]) 07/13/2018  . Mixed hyperlipidemia 03/31/2018  . GERD without esophagitis 03/31/2018  . Angina pectoris ([place]) 05/11/2017  . AKI (acute kidney injury) ([place]) 05/23/2011  . CAD (coronary artery disease) 05/23/2011  . Hypertension 05/23/2011    Past Surgical History:  Procedure Laterality Date  . CARDIAC CATHETERIZATION  07/08/2003   2.25x20 [place] balloon angioplasty of diagonal 2 branch of LAD (Dr. Lowella Dell)  . CARDIAC CATHETERIZATION  03/25/2004   diagonal vessel patent w/40% restenosis, 50-60% mid LAD stenosis (Dr. Jackie Plum)  . CARDIAC CATHETERIZATION  07/23/2006   no significant change from 2006 cath (Dr. Domenic Moras)   . INTRAVASCULAR PRESSURE WIRE/FFR STUDY N/A 05/11/2017   Procedure: INTRAVASCULAR PRESSURE WIRE/FFR STUDY;  Surgeon: Nigel Mormon, MD;  Location: [facility];  Service: Cardiovascular;  Laterality: N/A;  . LEFT HEART CATH AND CORONARY ANGIOGRAPHY N/A 05/11/2017   Procedure: LEFT HEART CATH AND CORONARY ANGIOGRAPHY;  Surgeon: Nigel Mormon, MD;  Location: [facility];  Service: Cardiovascular;  Laterality: N/A;  . TRANSTHORACIC ECHOCARDIOGRAM  05/24/2011   EF 55-60%, mild LVH, grade 1 diastolic dysfunction; AV sclerosis; normal CVP    Allergies Ace inhibitors, Lipitor [atorvastatin calcium], Simvastatin, and Advicor [niacin-lovastatin er]  History reviewed. No pertinent family history.  Social History Social History   Tobacco Use  . Smoking status: Former Smoker    Types: Cigarettes    Quit date: 12/28/1994    Years since quitting: 25.4  . Smokeless tobacco: Never Used  Vaping Use  . Vaping Use: Never used  Substance Use Topics  . Alcohol use:  No  . Drug use: No    Review of Systems  Constitutional: No fever/chills. Positive feeling off balance.  Eyes: Blurry vision.  ENT: No sore throat. Cardiovascular: Denies chest pain. Respiratory: Positive shortness of breath and cough.  Gastrointestinal: No abdominal pain. No nausea, no vomiting.  No diarrhea.  No constipation. Genitourinary: Negative for dysuria. Musculoskeletal: Negative for back pain. Skin: Negative for rash. Neurological: Negative for focal weakness or numbness. Positive   10-point ROS otherwise negative.  ____________________________________________   PHYSICAL EXAM:  VITAL SIGNS: ED Triage Vitals  Enc Vitals Group     BP 05/25/20 1259 133/78     Pulse Rate 05/25/20 1259 79     Resp 05/25/20 1259 16     Temp 05/25/20 1307 98.3 F (36.8 C)     Temp Source 05/25/20 1307 Oral     SpO2 05/25/20 1259 96 %   Constitutional: Alert and oriented. Well appearing and in no acute distress. Eyes: Conjunctivae are normal.  Head: Atraumatic. Nose: No congestion/rhinnorhea. Mouth/Throat: Mucous membranes are moist.   Neck: No stridor.   Cardiovascular: Normal rate, regular rhythm. Good peripheral circulation. Grossly normal heart sounds.   Respiratory: Normal respiratory effort.  No retractions. Lungs CTAB. Gastrointestinal: Soft and nontender. No distention.  Musculoskeletal: No lower extremity tenderness nor edema. No gross deformities of extremities. Neurologic:  Normal speech and language. No gross focal neurologic deficits are appreciated.  No facial asymmetry.  Normal finger-to-nose testing.  Skin:  Skin is warm, dry and intact. No rash noted.  ____________________________________________   LABS (all labs ordered are listed, but only abnormal results are displayed)  Labs Reviewed  CBC - Abnormal; Notable for the following components:      Result Value   Hemoglobin 12.6 (*)    HCT 37.7 (*)    All other components within normal limits  COMPREHENSIVE  METABOLIC PANEL - Abnormal; Notable for the following components:   Glucose, Bld 173 (*)    Creatinine, Ser 1.54 (*)    GFR, Estimated 45 (*)    All other components within normal limits  CBG MONITORING, ED - Abnormal; Notable for the following components:   Glucose-Capillary 179 (*)    All other components within normal limits  RESP PANEL BY RT-PCR (FLU A&B, COVID) ARPGX2  PROTIME-INR  APTT  DIFFERENTIAL  BRAIN NATRIURETIC PEPTIDE  I-STAT CHEM 8, ED  CBG MONITORING, ED  I-STAT BETA HCG BLOOD, ED ([facility], [facility], [facility] ONLY)  TROPONIN I (HIGH SENSITIVITY)  TROPONIN I (HIGH SENSITIVITY)   ____________________________________________  EKG   EKG Interpretation  Date/Time:  Tuesday May 25 2020 13:03:32 EDT Ventricular Rate:  77 PR Interval:  150 QRS Duration: 80 QT Interval:  340 QTC Calculation: 384 R Axis:   33 Text Interpretation: Normal sinus rhythm Nonspecific T wave abnormality Abnormal ECG Confirmed by Nanda Quinton [phone]) on 05/25/2020 3:01:47 PM       ____________________________________________  RADIOLOGY  DG Chest 2 View  Result Date: 05/25/2020 CLINICAL DATA:  Shortness of breath. EXAM: CHEST - 2 VIEW COMPARISON:  07/16/2018 FINDINGS: Patchy densities in the mid and lower left chest appear to be  chronic. Chronic blunting at the left costophrenic angle. Improved aeration at the right lung base compared to the prior examination. Heart size is within normal limits and stable. The trachea is midline. No large pleural effusions. IMPRESSION: Patchy densities in the mid and lower left chest are likely chronic. Difficult to exclude areas of atelectasis or subtle infection. Electronically Signed   By: Markus Daft M.D.   On: 05/25/2020 13:59    ____________________________________________   PROCEDURES  Procedure(s) performed:   Procedures  None  ____________________________________________   INITIAL IMPRESSION / ASSESSMENT AND PLAN / ED COURSE  Pertinent labs & imaging  results that were available during my care of the patient were reviewed by me and considered in my medical decision making (see chart for details).   Patient presents to the emergency department with family member who provides assistance with interpretation.  Has had mainly shortness of breath with cough and headache over the past 2 days.  This morning developed some blurry vision and feeling off balance.  I did not appreciate any ataxia or other focal neurologic deficits on my exam.  He is not hypoxemic and is not exhibiting significant increased work of breathing.  ACS considered but felt to be less likely without pain and nonischemic EKG.  Will follow troponin and BNP.  The chest x-ray shows some patchy densities that are likely chronic but could be subtle infection.  Covid and flu are pending.  If negative might consider brief course of antibiotics with increased symptoms from his chronic symptoms.  Plan for CT imaging of the head as well.  We will ambulate after that and if he continues to be neuro intact and feeling better I think it is reasonable to hold on the MRI but if symptoms persist may consider MRI for further rule out of stroke.   CT head pending. Care transferred to Dr. Darl Householder.  ____________________________________________  FINAL CLINICAL IMPRESSION(S) / ED DIAGNOSES  Final diagnoses:  Community acquired pneumonia of left lower lobe of lung  Dizziness     MEDICATIONS GIVEN DURING THIS VISIT:  Medications  acetaminophen (TYLENOL) tablet 1,000 mg (1,000 mg Oral Given 05/25/20 1553)  cephALEXin (KEFLEX) capsule 1,000 mg (1,000 mg Oral Given 05/25/20 1639)     NEW OUTPATIENT MEDICATIONS STARTED DURING THIS VISIT:  Discharge Medication List as of 05/25/2020  4:27 PM    START taking these medications   Details  azithromycin (ZITHROMAX Z-PAK) 250 MG tablet 2 po day one, then 1 daily x 4 days, Print    cephALEXin (KEFLEX) 500 MG capsule Take 1 capsule (500 mg total) by mouth 3  (three) times daily., Starting Tue 05/25/2020, Print    Promethazine-Codeine 6.25-10 MG/5ML SOLN Take 5 mLs by mouth 2 (two) times daily as needed., Starting Tue 05/25/2020, Normal        Note:  This document was prepared using Dragon voice recognition software and may include unintentional dictation errors.  Nanda Quinton, MD, [facility]    Tallen Schnorr, [facility], MD 05/27/20 [phone]

## 2020-06-25 ENCOUNTER — Other Ambulatory Visit: Payer: Self-pay | Admitting: Physician Assistant

## 2020-06-25 DIAGNOSIS — E1129 Type 2 diabetes mellitus with other diabetic kidney complication: Secondary | ICD-10-CM | POA: Diagnosis not present

## 2020-06-25 DIAGNOSIS — R140 Abdominal distension (gaseous): Secondary | ICD-10-CM

## 2020-06-25 DIAGNOSIS — D649 Anemia, unspecified: Secondary | ICD-10-CM | POA: Diagnosis not present

## 2020-06-25 DIAGNOSIS — K219 Gastro-esophageal reflux disease without esophagitis: Secondary | ICD-10-CM | POA: Diagnosis not present

## 2020-07-07 DIAGNOSIS — I209 Angina pectoris, unspecified: Secondary | ICD-10-CM | POA: Diagnosis not present

## 2020-07-07 DIAGNOSIS — E1129 Type 2 diabetes mellitus with other diabetic kidney complication: Secondary | ICD-10-CM | POA: Diagnosis not present

## 2020-07-07 DIAGNOSIS — E782 Mixed hyperlipidemia: Secondary | ICD-10-CM | POA: Diagnosis not present

## 2020-07-07 DIAGNOSIS — I129 Hypertensive chronic kidney disease with stage 1 through stage 4 chronic kidney disease, or unspecified chronic kidney disease: Secondary | ICD-10-CM | POA: Diagnosis not present

## 2020-07-07 DIAGNOSIS — I25119 Atherosclerotic heart disease of native coronary artery with unspecified angina pectoris: Secondary | ICD-10-CM | POA: Diagnosis not present

## 2020-07-07 DIAGNOSIS — I2510 Atherosclerotic heart disease of native coronary artery without angina pectoris: Secondary | ICD-10-CM | POA: Diagnosis not present

## 2020-07-07 DIAGNOSIS — N183 Chronic kidney disease, stage 3 unspecified: Secondary | ICD-10-CM | POA: Diagnosis not present

## 2020-07-07 DIAGNOSIS — K219 Gastro-esophageal reflux disease without esophagitis: Secondary | ICD-10-CM | POA: Diagnosis not present

## 2020-07-09 ENCOUNTER — Ambulatory Visit
Admission: RE | Admit: 2020-07-09 | Discharge: 2020-07-09 | Disposition: A | Discharge status: Home / Self Care | Payer: Medicare Other | Source: Ambulatory Visit | Attending: Physician Assistant | Admitting: Physician Assistant

## 2020-07-09 DIAGNOSIS — N281 Cyst of kidney, acquired: Secondary | ICD-10-CM | POA: Diagnosis not present

## 2020-07-09 DIAGNOSIS — R140 Abdominal distension (gaseous): Secondary | ICD-10-CM

## 2020-08-13 DIAGNOSIS — N183 Chronic kidney disease, stage 3 unspecified: Secondary | ICD-10-CM | POA: Diagnosis not present

## 2020-08-13 DIAGNOSIS — E782 Mixed hyperlipidemia: Secondary | ICD-10-CM | POA: Diagnosis not present

## 2020-08-13 DIAGNOSIS — Z0000 Encounter for general adult medical examination without abnormal findings: Secondary | ICD-10-CM | POA: Diagnosis not present

## 2020-08-13 DIAGNOSIS — K219 Gastro-esophageal reflux disease without esophagitis: Secondary | ICD-10-CM | POA: Diagnosis not present

## 2020-08-13 DIAGNOSIS — I700 Atherosclerosis of aorta: Secondary | ICD-10-CM | POA: Diagnosis not present

## 2020-08-13 DIAGNOSIS — I209 Angina pectoris, unspecified: Secondary | ICD-10-CM | POA: Diagnosis not present

## 2020-08-13 DIAGNOSIS — Z1389 Encounter for screening for other disorder: Secondary | ICD-10-CM | POA: Diagnosis not present

## 2020-08-13 DIAGNOSIS — I25119 Atherosclerotic heart disease of native coronary artery with unspecified angina pectoris: Secondary | ICD-10-CM | POA: Diagnosis not present

## 2020-08-13 DIAGNOSIS — Z86010 Personal history of colonic polyps: Secondary | ICD-10-CM | POA: Diagnosis not present

## 2020-08-13 DIAGNOSIS — E1129 Type 2 diabetes mellitus with other diabetic kidney complication: Secondary | ICD-10-CM | POA: Diagnosis not present

## 2020-08-13 DIAGNOSIS — I129 Hypertensive chronic kidney disease with stage 1 through stage 4 chronic kidney disease, or unspecified chronic kidney disease: Secondary | ICD-10-CM | POA: Diagnosis not present

## 2020-08-13 DIAGNOSIS — C8590 Non-Hodgkin lymphoma, unspecified, unspecified site: Secondary | ICD-10-CM | POA: Diagnosis not present

## 2020-08-17 NOTE — Progress Notes (Signed)
External labs 08/13/2020: A1c 6.4% Total cholesterol 139, HDL 45, LDL 75, triglycerides 107

## 2020-09-06 DIAGNOSIS — E1129 Type 2 diabetes mellitus with other diabetic kidney complication: Secondary | ICD-10-CM | POA: Diagnosis not present

## 2020-09-06 DIAGNOSIS — I129 Hypertensive chronic kidney disease with stage 1 through stage 4 chronic kidney disease, or unspecified chronic kidney disease: Secondary | ICD-10-CM | POA: Diagnosis not present

## 2020-09-06 DIAGNOSIS — I25119 Atherosclerotic heart disease of native coronary artery with unspecified angina pectoris: Secondary | ICD-10-CM | POA: Diagnosis not present

## 2020-09-06 DIAGNOSIS — I209 Angina pectoris, unspecified: Secondary | ICD-10-CM | POA: Diagnosis not present

## 2020-09-06 DIAGNOSIS — I2510 Atherosclerotic heart disease of native coronary artery without angina pectoris: Secondary | ICD-10-CM | POA: Diagnosis not present

## 2020-09-06 DIAGNOSIS — N183 Chronic kidney disease, stage 3 unspecified: Secondary | ICD-10-CM | POA: Diagnosis not present

## 2020-09-06 DIAGNOSIS — E782 Mixed hyperlipidemia: Secondary | ICD-10-CM | POA: Diagnosis not present

## 2020-09-06 DIAGNOSIS — K219 Gastro-esophageal reflux disease without esophagitis: Secondary | ICD-10-CM | POA: Diagnosis not present

## 2020-10-06 DIAGNOSIS — I2510 Atherosclerotic heart disease of native coronary artery without angina pectoris: Secondary | ICD-10-CM | POA: Diagnosis not present

## 2020-10-06 DIAGNOSIS — E1129 Type 2 diabetes mellitus with other diabetic kidney complication: Secondary | ICD-10-CM | POA: Diagnosis not present

## 2020-10-06 DIAGNOSIS — K219 Gastro-esophageal reflux disease without esophagitis: Secondary | ICD-10-CM | POA: Diagnosis not present

## 2020-10-06 DIAGNOSIS — I209 Angina pectoris, unspecified: Secondary | ICD-10-CM | POA: Diagnosis not present

## 2020-10-06 DIAGNOSIS — I129 Hypertensive chronic kidney disease with stage 1 through stage 4 chronic kidney disease, or unspecified chronic kidney disease: Secondary | ICD-10-CM | POA: Diagnosis not present

## 2020-10-06 DIAGNOSIS — N183 Chronic kidney disease, stage 3 unspecified: Secondary | ICD-10-CM | POA: Diagnosis not present

## 2020-10-06 DIAGNOSIS — E782 Mixed hyperlipidemia: Secondary | ICD-10-CM | POA: Diagnosis not present

## 2020-10-06 DIAGNOSIS — I25119 Atherosclerotic heart disease of native coronary artery with unspecified angina pectoris: Secondary | ICD-10-CM | POA: Diagnosis not present

## 2020-10-18 DIAGNOSIS — M25571 Pain in right ankle and joints of right foot: Secondary | ICD-10-CM | POA: Diagnosis not present

## 2020-11-03 DIAGNOSIS — I25119 Atherosclerotic heart disease of native coronary artery with unspecified angina pectoris: Secondary | ICD-10-CM | POA: Diagnosis not present

## 2020-11-03 DIAGNOSIS — E782 Mixed hyperlipidemia: Secondary | ICD-10-CM | POA: Diagnosis not present

## 2020-11-03 DIAGNOSIS — N183 Chronic kidney disease, stage 3 unspecified: Secondary | ICD-10-CM | POA: Diagnosis not present

## 2020-11-03 DIAGNOSIS — I129 Hypertensive chronic kidney disease with stage 1 through stage 4 chronic kidney disease, or unspecified chronic kidney disease: Secondary | ICD-10-CM | POA: Diagnosis not present

## 2020-11-03 DIAGNOSIS — K219 Gastro-esophageal reflux disease without esophagitis: Secondary | ICD-10-CM | POA: Diagnosis not present

## 2020-11-03 DIAGNOSIS — I209 Angina pectoris, unspecified: Secondary | ICD-10-CM | POA: Diagnosis not present

## 2020-11-03 DIAGNOSIS — E1129 Type 2 diabetes mellitus with other diabetic kidney complication: Secondary | ICD-10-CM | POA: Diagnosis not present

## 2020-11-03 DIAGNOSIS — I2510 Atherosclerotic heart disease of native coronary artery without angina pectoris: Secondary | ICD-10-CM | POA: Diagnosis not present

## 2020-12-10 DIAGNOSIS — I209 Angina pectoris, unspecified: Secondary | ICD-10-CM | POA: Diagnosis not present

## 2020-12-10 DIAGNOSIS — I129 Hypertensive chronic kidney disease with stage 1 through stage 4 chronic kidney disease, or unspecified chronic kidney disease: Secondary | ICD-10-CM | POA: Diagnosis not present

## 2020-12-10 DIAGNOSIS — E1129 Type 2 diabetes mellitus with other diabetic kidney complication: Secondary | ICD-10-CM | POA: Diagnosis not present

## 2020-12-10 DIAGNOSIS — I25119 Atherosclerotic heart disease of native coronary artery with unspecified angina pectoris: Secondary | ICD-10-CM | POA: Diagnosis not present

## 2020-12-10 DIAGNOSIS — E782 Mixed hyperlipidemia: Secondary | ICD-10-CM | POA: Diagnosis not present

## 2020-12-10 DIAGNOSIS — I2510 Atherosclerotic heart disease of native coronary artery without angina pectoris: Secondary | ICD-10-CM | POA: Diagnosis not present

## 2020-12-10 DIAGNOSIS — K219 Gastro-esophageal reflux disease without esophagitis: Secondary | ICD-10-CM | POA: Diagnosis not present

## 2020-12-10 DIAGNOSIS — E119 Type 2 diabetes mellitus without complications: Secondary | ICD-10-CM | POA: Diagnosis not present

## 2020-12-10 DIAGNOSIS — N183 Chronic kidney disease, stage 3 unspecified: Secondary | ICD-10-CM | POA: Diagnosis not present

## 2020-12-24 DIAGNOSIS — R059 Cough, unspecified: Secondary | ICD-10-CM | POA: Diagnosis not present

## 2021-01-01 ENCOUNTER — Telehealth: Payer: Self-pay | Admitting: Cardiology

## 2021-01-01 DIAGNOSIS — R0781 Pleurodynia: Secondary | ICD-10-CM

## 2021-01-01 NOTE — Telephone Encounter (Signed)
Daughter called. Patient has been having cough for last two weeks. COVID, flu negative, Now has pleuritic pain with talking and coughing. No fever, shortness of breath. Unlikely to be ACS or angina. Small possibility that it could be pericaridits, but again without any alarming signs.  Continue f/u w/PCP.   Time spent: 5 min   Nigel Mormon, MD Pager: [phone] Office: [phone]

## 2021-01-03 DIAGNOSIS — N183 Chronic kidney disease, stage 3 unspecified: Secondary | ICD-10-CM | POA: Diagnosis not present

## 2021-01-03 DIAGNOSIS — K219 Gastro-esophageal reflux disease without esophagitis: Secondary | ICD-10-CM | POA: Diagnosis not present

## 2021-01-03 DIAGNOSIS — I25119 Atherosclerotic heart disease of native coronary artery with unspecified angina pectoris: Secondary | ICD-10-CM | POA: Diagnosis not present

## 2021-01-03 DIAGNOSIS — E1129 Type 2 diabetes mellitus with other diabetic kidney complication: Secondary | ICD-10-CM | POA: Diagnosis not present

## 2021-01-03 DIAGNOSIS — E782 Mixed hyperlipidemia: Secondary | ICD-10-CM | POA: Diagnosis not present

## 2021-01-03 DIAGNOSIS — I129 Hypertensive chronic kidney disease with stage 1 through stage 4 chronic kidney disease, or unspecified chronic kidney disease: Secondary | ICD-10-CM | POA: Diagnosis not present

## 2021-01-31 DIAGNOSIS — I129 Hypertensive chronic kidney disease with stage 1 through stage 4 chronic kidney disease, or unspecified chronic kidney disease: Secondary | ICD-10-CM | POA: Diagnosis not present

## 2021-01-31 DIAGNOSIS — E782 Mixed hyperlipidemia: Secondary | ICD-10-CM | POA: Diagnosis not present

## 2021-01-31 DIAGNOSIS — K219 Gastro-esophageal reflux disease without esophagitis: Secondary | ICD-10-CM | POA: Diagnosis not present

## 2021-01-31 DIAGNOSIS — N183 Chronic kidney disease, stage 3 unspecified: Secondary | ICD-10-CM | POA: Diagnosis not present

## 2021-01-31 DIAGNOSIS — E1129 Type 2 diabetes mellitus with other diabetic kidney complication: Secondary | ICD-10-CM | POA: Diagnosis not present

## 2021-02-18 DIAGNOSIS — I700 Atherosclerosis of aorta: Secondary | ICD-10-CM | POA: Diagnosis not present

## 2021-02-18 DIAGNOSIS — C8590 Non-Hodgkin lymphoma, unspecified, unspecified site: Secondary | ICD-10-CM | POA: Diagnosis not present

## 2021-02-18 DIAGNOSIS — I25119 Atherosclerotic heart disease of native coronary artery with unspecified angina pectoris: Secondary | ICD-10-CM | POA: Diagnosis not present

## 2021-02-18 DIAGNOSIS — K219 Gastro-esophageal reflux disease without esophagitis: Secondary | ICD-10-CM | POA: Diagnosis not present

## 2021-02-18 DIAGNOSIS — R059 Cough, unspecified: Secondary | ICD-10-CM | POA: Diagnosis not present

## 2021-02-18 DIAGNOSIS — E1129 Type 2 diabetes mellitus with other diabetic kidney complication: Secondary | ICD-10-CM | POA: Diagnosis not present

## 2021-02-18 DIAGNOSIS — I209 Angina pectoris, unspecified: Secondary | ICD-10-CM | POA: Diagnosis not present

## 2021-02-18 DIAGNOSIS — N183 Chronic kidney disease, stage 3 unspecified: Secondary | ICD-10-CM | POA: Diagnosis not present

## 2021-02-18 DIAGNOSIS — I129 Hypertensive chronic kidney disease with stage 1 through stage 4 chronic kidney disease, or unspecified chronic kidney disease: Secondary | ICD-10-CM | POA: Diagnosis not present

## 2021-02-18 DIAGNOSIS — Z86010 Personal history of colonic polyps: Secondary | ICD-10-CM | POA: Diagnosis not present

## 2021-02-18 DIAGNOSIS — E782 Mixed hyperlipidemia: Secondary | ICD-10-CM | POA: Diagnosis not present

## 2021-03-03 DIAGNOSIS — I129 Hypertensive chronic kidney disease with stage 1 through stage 4 chronic kidney disease, or unspecified chronic kidney disease: Secondary | ICD-10-CM | POA: Diagnosis not present

## 2021-03-03 DIAGNOSIS — E1129 Type 2 diabetes mellitus with other diabetic kidney complication: Secondary | ICD-10-CM | POA: Diagnosis not present

## 2021-03-03 DIAGNOSIS — N183 Chronic kidney disease, stage 3 unspecified: Secondary | ICD-10-CM | POA: Diagnosis not present

## 2021-03-03 DIAGNOSIS — E782 Mixed hyperlipidemia: Secondary | ICD-10-CM | POA: Diagnosis not present

## 2021-03-03 DIAGNOSIS — K219 Gastro-esophageal reflux disease without esophagitis: Secondary | ICD-10-CM | POA: Diagnosis not present

## 2021-04-15 DIAGNOSIS — R053 Chronic cough: Secondary | ICD-10-CM | POA: Diagnosis not present

## 2021-04-21 ENCOUNTER — Ambulatory Visit: Payer: Medicare Other | Admitting: Cardiology

## 2021-04-21 ENCOUNTER — Encounter: Payer: Self-pay | Admitting: Cardiology

## 2021-04-21 ENCOUNTER — Other Ambulatory Visit: Payer: Self-pay

## 2021-04-21 VITALS — BP 147/80 | HR 76 | Temp 98.4°F | Resp 16 | Ht 64.0 in | Wt 175.4 lb

## 2021-04-21 DIAGNOSIS — I10 Essential (primary) hypertension: Secondary | ICD-10-CM

## 2021-04-21 DIAGNOSIS — I5032 Chronic diastolic (congestive) heart failure: Secondary | ICD-10-CM | POA: Diagnosis not present

## 2021-04-21 DIAGNOSIS — I25118 Atherosclerotic heart disease of native coronary artery with other forms of angina pectoris: Secondary | ICD-10-CM | POA: Diagnosis not present

## 2021-04-21 DIAGNOSIS — R0609 Other forms of dyspnea: Secondary | ICD-10-CM | POA: Diagnosis not present

## 2021-04-21 DIAGNOSIS — K219 Gastro-esophageal reflux disease without esophagitis: Secondary | ICD-10-CM | POA: Diagnosis not present

## 2021-04-21 DIAGNOSIS — N1832 Chronic kidney disease, stage 3b: Secondary | ICD-10-CM

## 2021-04-21 DIAGNOSIS — K5900 Constipation, unspecified: Secondary | ICD-10-CM | POA: Diagnosis not present

## 2021-04-21 DIAGNOSIS — R140 Abdominal distension (gaseous): Secondary | ICD-10-CM | POA: Diagnosis not present

## 2021-04-21 DIAGNOSIS — I129 Hypertensive chronic kidney disease with stage 1 through stage 4 chronic kidney disease, or unspecified chronic kidney disease: Secondary | ICD-10-CM | POA: Diagnosis not present

## 2021-04-21 DIAGNOSIS — E782 Mixed hyperlipidemia: Secondary | ICD-10-CM

## 2021-04-21 MED ORDER — NITROGLYCERIN 0.4 MG SL SUBL: 0.4000 mg | SUBLINGUAL_TABLET | SUBLINGUAL | 2 refills | Status: DC | PRN

## 2021-04-21 MED ORDER — HYDRALAZINE HCL 25 MG PO TABS: 25.0000 mg | ORAL_TABLET | Freq: Three times a day (TID) | ORAL | 2 refills | Status: DC

## 2021-04-21 NOTE — Progress Notes (Signed)
Primary Physician/Referring:  [name], MD  Patient ID: [name], male    DOB: Apr 17, 1940, 81 y.o.   MRN: [id number]  Chief Complaint  Patient presents with   Coronary Artery Disease   Follow-up    1 year    HPI:    [name]  is a 81 y.o. [place] male with coronary artery disease status post PTCA to diagonal 2 for acute MI in 2005, hypertension, hyperlipidemia, diabetes mellitus who  angiography on 05/11/2017, showed mild proximal RCA disease, moderate disease in true LAD and circumflex with negative FFR, patent D2 angioplasty site.  He presents for annual visit, daughter has noticed gradually worsening dyspnea.  Patient is planning to go to [place] for 3 weeks soon.  Denies chest pain.  No PND or orthopnea.  No leg edema.  Past Medical History:  Diagnosis Date   Coronary artery disease    balloon angioplasty of diagonal branch of LAD in 2005   DM2 (diabetes mellitus, type 2) (HCC)    Dyslipidemia    GERD (gastroesophageal reflux disease)    History of nuclear stress test 10/21/2009   dipyridamole; mod perfusion defect due to infarct/scar with mild periinfarct ischemia in mid ant/apical ant/mid anterolateral/apical lateral; no significant ischemia    Hx of pulmonary embolus    Hyperglycemia 03/31/2018   Hypertension    Ischemic cardiomyopathy    h/o   MI (myocardial infarction) ([place]) 07/09/2003   anterior    Mixed hyperlipidemia 03/31/2018   OA (osteoarthritis)    Positive TB test    h/o   Unstable angina ([place]) 05/11/2017  History reviewed. No pertinent family history. No known family history of premature CAD or sudden cardiac death.  Social History   Tobacco Use   Smoking status: Former    Types: Cigarettes    Quit date: 12/28/1994    Years since quitting: 26.3   Smokeless tobacco: Never  Substance Use Topics   Alcohol use: No  Marital Status: Married    ROS  Review of Systems  Cardiovascular:  Negative for chest pain, dyspnea on exertion and leg  swelling.  Objective  Blood pressure (!) 147/80, pulse 76, temperature 98.4 F (36.9 C), temperature source Temporal, resp. rate 16, height _0  (1.626 m), weight 175 lb 6.4 oz (79.6 kg), SpO2 96 %.  Vitals with BMI 04/21/2021 04/21/2021 05/25/2020  Height - _1  -  Weight - 175 lbs 6 oz -  BMI - 99.37 -  Systolic 169 678 938  Diastolic 80 97 74  Pulse 76 78 79    Physical Exam Neck:     Vascular: No JVD.  Cardiovascular:     Rate and Rhythm: Normal rate and regular rhythm.     Pulses: Intact distal pulses.     Heart sounds: Normal heart sounds. No murmur heard.   No gallop.  Pulmonary:     Effort: Pulmonary effort is normal.     Breath sounds: Normal breath sounds.  Abdominal:     General: Bowel sounds are normal.     Palpations: Abdomen is soft.  Musculoskeletal:     Right lower leg: No edema.     Left lower leg: No edema.   Laboratory examination:   TSH Recent Labs    04/21/21 0926  TSH 2.890    BNP (last 3 results) Recent Labs    05/25/20 1306  BNP 81.9    ProBNP (last 3 results) Recent Labs    04/21/21 0926  PROBNP 543*  External labs:  Labs 02/18/2021:  A1c 7.2%.  Total cholesterol 139, triglycerides 107, HDL 45, LDL 75.  Hb 12.3/HCT 36.9, platelets 154.  Normal indicis.  Serum glucose 89 mg, BUN 23, creatinine 1.84, EGFR 36,, potassium 4.8, LFTs normal.   Medications and allergies   Allergies  Allergen Reactions   Ace Inhibitors Cough   Lipitor [Atorvastatin Calcium] Other (See Comments)    Muscle aches   Simvastatin Other (See Comments)    Muscle aches   Advicor [Niacin-Lovastatin Er] Rash     Current Outpatient Medications:    aspirin 81 MG chewable tablet, Chew 1 tablet (81 mg total) by mouth daily., Disp: 60 tablet, Rfl: 6   colchicine 0.6 MG tablet, Take 0.6 mg by mouth continuous as needed. Take 0.6 mg by mouth every eight hours until acute gout flare subsides, Disp: , Rfl: 0   furosemide (LASIX) 20 MG tablet, Take 1 tablet (20 mg  total) by mouth every morning., Disp: 90 tablet, Rfl: 3   glimepiride (AMARYL) 1 MG tablet, Take 1 tablet by mouth daily. On hold, Disp: , Rfl:    glucosamine-chondroitin 500-400 MG tablet, Take 1 tablet by mouth daily. , Disp: , Rfl:    hydrALAZINE (APRESOLINE) 25 MG tablet, Take 1 tablet (25 mg total) by mouth 3 (three) times daily., Disp: 90 tablet, Rfl: 2   isosorbide mononitrate (IMDUR) 60 MG 24 hr tablet, Take 60 mg by mouth daily., Disp: , Rfl:    labetalol (NORMODYNE) 200 MG tablet, TAKE ONE TABLET BY MOUTH AT BREAKFAST AND AT BEDTIME, Disp: 180 tablet, Rfl: 2   losartan (COZAAR) 50 MG tablet, Take 50 mg by mouth at bedtime., Disp: , Rfl:    Multiple Vitamins-Minerals (CENTRUM SILVER 50+MEN PO), 1 tablet, Disp: , Rfl:    ondansetron (ZOFRAN) 8 MG tablet, Take 8 mg by mouth every 8 (eight) hours as needed for nausea or vomiting. , Disp: , Rfl:    pantoprazole (PROTONIX) 40 MG tablet, Take 40 mg by mouth daily., Disp: , Rfl:    polyethylene glycol (MIRALAX / GLYCOLAX) 17 g packet, 1 packet mixed with 8 ounces of fluid, Disp: , Rfl:    Promethazine-Codeine 6.25-10 MG/5ML SOLN, Take 5 mLs by mouth 2 (two) times daily as needed., Disp: 180 mL, Rfl: 0   rosuvastatin (CRESTOR) 20 MG tablet, Take 20 mg by mouth daily. , Disp: , Rfl:    simethicone (MYLICON) 80 MG chewable tablet, 1 tablet after meals and at bedtime as needed, Disp: , Rfl:    vitamin C (VITAMIN C) 500 MG tablet, Take 1 tablet (500 mg total) by mouth 2 (two) times daily., Disp: 60 tablet, Rfl: 0   amLODipine (NORVASC) 10 MG tablet, TAKE 1 TABLET BY MOUTH EVERY DAY AT BREAKFAST FOR BLOOD PRESSURE, Disp: 90 tablet, Rfl: 3   nitroGLYCERIN (NITROSTAT) 0.4 MG SL tablet, Place 1 tablet (0.4 mg total) under the tongue every 5 (five) minutes as needed for chest pain., Disp: 25 tablet, Rfl: 2   predniSONE (DELTASONE) 20 MG tablet, Take 40 mg by mouth daily., Disp: , Rfl:      Radiology:   Chest X-Ray 01/07/2014: Normal chest x-ray.  Mild  cardiomegaly.  No active cardiopulmonary disease.  Cardiac Studies:   Coronary angiogram 05/11/2017: Moderate nonobstructive coronary artery disease. No true left main. Ostial 50%, mid 30-40% stenoses. FFR 0.83. Medium sized diagonal with diffuse 60% disease Ostial LCx 40% with resting Pd/Pa 0.98. Mid 20-30% disease Prox RCA 40% stenosis. Normal LVEDP, LVEF  55-65%.  Echocardiogram 01/25/2018: Left ventricle cavity is normal in size. Mild concentric hypertrophy of the left ventricle. Normal global wall motion. Doppler evidence of grade II (pseudonormal) diastolic dysfunction. Diastolic dysfunction findings suggests elevated LA/LV end diastolic pressure. Calculated EF 55%. Trace tricuspid regurgitation. Mild to moderate pulmonic regurgitation. Compared to 01/16/2013, no significant change. Diastolic dysfunction was grade I.  PCV ECHOCARDIOGRAM COMPLETE 32/76/1470 Normal LV systolic function with visual EF 55-60%. Left ventricle cavity is normal in size. Normal global wall motion. Normal diastolic filling pattern, indeterminate LAP. Mild (Grade I) aortic regurgitation. Mild (Grade I) mitral regurgitation. Moderate pulmonic regurgitation. Compared to prior study dated 01/25/2018 no significant change.  PCV MYOCARDIAL PERFUSION WO LEXISCAN 03/24/2020 Normal ECG stress. The patient exercised for 5 minutes and 18 seconds of a Bruce protocol, achieving approximately 7.05 METs.  Stress terminated due to dyspnea. Resting EKG normal sinus rhythm, stress EKG no ST-T wave changes with stress testing.  Frequent PVCs were evident through the exercise and in recovery. Myocardial perfusion is abnormal. There is a moderate-sized scar noted in the anterior and anterolateral and apical lateral region with moderate peri-infarct ischemia. Gated SPECT imaging of the left ventricle was abnormal, demonstrating akinesis of the apical anterior wall, apical lateral wall and mid anterior wall.  Stress LV EF is moderately  dysfunctional 37%. Compared to 10/18/2012, moderate peri-infarct ischemia is new.  Previously scar was noted in similar region.  Intermediate risk.  EKG   EKG 04/21/2021: Normal sinus rhythm at rate of 72 bpm, left atrial enlargement, left axis deviation, left anterior fascicular block.  LVH.  Nonspecific T wave flattening.  No significant change from 02/04/2022.  Assessment     ICD-10-CM   1. Coronary artery disease of native artery of native heart with stable angina pectoris (HCC)  I25.118 EKG 12-Lead    nitroGLYCERIN (NITROSTAT) 0.4 MG SL tablet    PCV ECHOCARDIOGRAM COMPLETE    2. Chronic diastolic heart failure (HCC)  I50.32 furosemide (LASIX) 20 MG tablet    3. Dyspnea on exertion  R06.09 Pro b natriuretic peptide (BNP)    PCV ECHOCARDIOGRAM COMPLETE    4. Essential hypertension  I10 hydrALAZINE (APRESOLINE) 25 MG tablet    TSH    amLODipine (NORVASC) 10 MG tablet    5. Stage 3b chronic kidney disease (HCC)  N18.32     6. Mixed hyperlipidemia  E78.2       Meds ordered this encounter  Medications   hydrALAZINE (APRESOLINE) 25 MG tablet    Sig: Take 1 tablet (25 mg total) by mouth 3 (three) times daily.    Dispense:  90 tablet    Refill:  2   nitroGLYCERIN (NITROSTAT) 0.4 MG SL tablet    Sig: Place 1 tablet (0.4 mg total) under the tongue every 5 (five) minutes as needed for chest pain.    Dispense:  25 tablet    Refill:  2   furosemide (LASIX) 20 MG tablet    Sig: Take 1 tablet (20 mg total) by mouth every morning.    Dispense:  90 tablet    Refill:  3   amLODipine (NORVASC) 10 MG tablet    Sig: TAKE 1 TABLET BY MOUTH EVERY DAY AT BREAKFAST FOR BLOOD PRESSURE    Dispense:  90 tablet    Refill:  3    No refills remaining  10/25/2018 1:16:20 PM    Medications Discontinued During This Encounter  Medication Reason   azithromycin (ZITHROMAX Z-PAK) 250 MG tablet Completed Course  cephALEXin (KEFLEX) 500 MG capsule Completed Course   nitroGLYCERIN (NITROSTAT) 0.4 MG  SL tablet Reorder   amLODipine (NORVASC) 10 MG tablet Reorder    Recommendations:   Devon Pretty  is a 81 y.o.  [place] male with coronary artery disease status post PTCA to diagonal 2 for acute MI in 2005, hypertension, hyperlipidemia, diabetes mellitus who  angiography on 05/11/2017, showed mild proximal RCA disease, moderate disease in true LAD and circumflex with negative FFR, patent D2 angioplasty site.    His blood pressure is uncontrolled, although previously he was given hydralazine he thought that the blood pressure increased by taking the medication.  Patient's daughter is willing to try it again, 30-day prescription sent.  He is planning to travel to [place], I will send in 90-day prescriptions if he tolerates the medication.  He is coronary artery disease may have progressed in view of uncontrolled diabetes mellitus.  Nuclear stress test that was performed last year and reviewed, he has moderate peri-infarct ischemia in the anterior wall, I would like to repeat echocardiogram to see wall motion abnormality, if there is no wall motion abnormality or EF is preserved, he will need cardiac catheterization as anterolateral ischemia may indicate progression of CAD.  I would like to try him on Jardiance 10 mg daily for both cardiovascular disease and also for uncontrolled diabetes mellitus however he is planning to travel to [place] in 2 weeks hence I did not make any other significant changes.  I will repeat echocardiogram and I will see him back in 2 months for follow-up.  Although he has chronic dyspnea, there is no clinical evidence of heart failure, no leg edema, no PND or orthopnea.  Uncontrolled hypertension and diabetes may be causing the dyspnea and he may have chronic diastolic heart failure, will obtain proBNP today along with TSH.     Adrian Prows, MD, [facility] 04/23/2021, 9:26 AM Office: [phone] Fax: [phone] Pager: [phone]

## 2021-04-22 LAB — PRO B NATRIURETIC PEPTIDE: NT-Pro BNP: 543 pg/mL — ABNORMAL HIGH (ref 0–486)

## 2021-04-22 LAB — TSH: TSH: 2.89 u[IU]/mL (ref 0.450–4.500)

## 2021-04-23 MED ORDER — FUROSEMIDE 20 MG PO TABS: 20.0000 mg | ORAL_TABLET | ORAL | 3 refills | Status: DC

## 2021-04-23 MED ORDER — AMLODIPINE BESYLATE 10 MG PO TABS: ORAL_TABLET | ORAL | 3 refills | Status: DC

## 2021-04-23 NOTE — Progress Notes (Signed)
Please let them know that I sent in for another tablet, Furosemide (lasix) to be taken daily in the morning, while in [place] ask them to check his kidney function and text it to her daughter who can then send this to [place]. Also ask her to sign up for mychart.  ?He needs to avoid salty food because of fluid accumulation in the body form his kidney problems and also heart issues

## 2021-04-26 ENCOUNTER — Telehealth: Payer: Self-pay

## 2021-04-26 ENCOUNTER — Encounter: Payer: Self-pay | Admitting: Cardiology

## 2021-04-26 DIAGNOSIS — I5032 Chronic diastolic (congestive) heart failure: Secondary | ICD-10-CM

## 2021-04-26 NOTE — Telephone Encounter (Signed)
I called her several times, she does not pick up and her voice mail is full. Also I have texted her to sign up for mychart and she has not. It is left to her whether she wants to give her dad medications. He has excess fluid and that is why he is short of breath and both medications lasix and hydralazine will help.

## 2021-04-27 NOTE — Telephone Encounter (Signed)
From pt

## 2021-04-27 NOTE — Telephone Encounter (Signed)
ICD-10-CM   ?1. Chronic diastolic heart failure (HCC)  K12.24 Basic metabolic panel  ?  Pro b natriuretic peptide (BNP)  ?  ? ?Orders Placed This Encounter  ?Procedures  ? Basic metabolic panel  ? Pro b natriuretic peptide (BNP)  ?  ?

## 2021-04-28 NOTE — Telephone Encounter (Signed)
Tried calling patient's daughter no answer unable to leave vm

## 2021-04-28 NOTE — Telephone Encounter (Signed)
Called and spoke to pts daughter, she is aware. She stated she still only wants the pt to take the hydralizine as needed because his BP has been running normal.

## 2021-04-29 ENCOUNTER — Ambulatory Visit: Payer: Medicare Other | Admitting: Cardiology

## 2021-05-03 NOTE — Progress Notes (Signed)
Called patient, spoke with daughter. Apparently daughter had already spoke with JG in [place] and another MA in the office and she was upset. The misunderstanding is, how the patient takes Furosemide. Because someone had spoken with patient daughter, the confusion was that she was already told (through [facility]) to have patient start Furosemide and when I spoke with her(daughter) the message says " I sent in a for another tablet", so because she did not tell me " I spoke with Dr. Lavone Nian already and he started him on Furosemide 3 days ago..." it wouldn't have caused a confusion, I was under the impression to take, two, at first, not knowing that this message had already been recently handled. I did get confirmation from [place] that patient is to take Furosemide 1 tab daily, daughter aware.

## 2021-05-10 ENCOUNTER — Encounter: Payer: Self-pay | Admitting: Student

## 2021-05-12 ENCOUNTER — Telehealth: Payer: Self-pay | Admitting: Student

## 2021-05-12 NOTE — Telephone Encounter (Signed)
ON-CALL CARDIOLOGY ?05/10/2021 ? ?Patient's name: [name].   ?MRN: [id number].    ?DOB: [birth year] ?Primary care provider: [name], MD. ? ?Interaction regarding this patient's care today: ?Spoke with patient's daughter who is concerned as patient is presently in [place] and after traveling has bilateral ankle edema.  Advised patient's daughter to have patient elevate legs when possible and wear compression socks.  Also advised that he may take Lasix 40 mg once daily for the next 2 to 3 days. ? ?Patient is not having orthopnea, PND, dyspnea. ? ? ?Telephone encounter total time: 7 minutes  ? ? ? ?Alethia Berthold, PA-C ?05/12/2021, 2:33 PM ?Office: [phone] ? ?

## 2021-05-20 ENCOUNTER — Ambulatory Visit: Payer: Medicare Other | Admitting: Student

## 2021-05-20 ENCOUNTER — Ambulatory Visit: Payer: Medicare Other | Admitting: Cardiology

## 2021-06-17 ENCOUNTER — Ambulatory Visit: Payer: Medicare Other | Admitting: Cardiology

## 2021-06-22 DIAGNOSIS — I5032 Chronic diastolic (congestive) heart failure: Secondary | ICD-10-CM | POA: Diagnosis not present

## 2021-06-23 LAB — PRO B NATRIURETIC PEPTIDE: NT-Pro BNP: 99 pg/mL (ref 0–486)

## 2021-06-23 LAB — BASIC METABOLIC PANEL
BUN/Creatinine Ratio: 11 (ref 10–24)
BUN: 16 mg/dL (ref 8–27)
CO2: 25 mmol/L (ref 20–29)
Calcium: 9.8 mg/dL (ref 8.6–10.2)
Chloride: 101 mmol/L (ref 96–106)
Creatinine, Ser: 1.48 mg/dL — ABNORMAL HIGH (ref 0.76–1.27)
Glucose: 86 mg/dL (ref 70–99)
Potassium: 4.4 mmol/L (ref 3.5–5.2)
Sodium: 142 mmol/L (ref 134–144)
eGFR: 47 mL/min/{1.73_m2} — ABNORMAL LOW (ref >59)

## 2021-06-29 ENCOUNTER — Ambulatory Visit: Payer: Medicare Other

## 2021-06-29 DIAGNOSIS — R0609 Other forms of dyspnea: Secondary | ICD-10-CM | POA: Diagnosis not present

## 2021-06-29 DIAGNOSIS — I25118 Atherosclerotic heart disease of native coronary artery with other forms of angina pectoris: Secondary | ICD-10-CM | POA: Diagnosis not present

## 2021-06-30 ENCOUNTER — Other Ambulatory Visit: Payer: Medicare Other

## 2021-07-01 ENCOUNTER — Other Ambulatory Visit: Payer: Medicare Other

## 2021-07-04 ENCOUNTER — Other Ambulatory Visit: Payer: Self-pay | Admitting: Cardiology

## 2021-07-04 DIAGNOSIS — I10 Essential (primary) hypertension: Secondary | ICD-10-CM

## 2021-07-05 ENCOUNTER — Encounter: Payer: Self-pay | Admitting: Cardiology

## 2021-07-05 ENCOUNTER — Encounter: Payer: Self-pay | Admitting: Student

## 2021-07-05 DIAGNOSIS — R600 Localized edema: Secondary | ICD-10-CM | POA: Diagnosis not present

## 2021-07-05 DIAGNOSIS — I5033 Acute on chronic diastolic (congestive) heart failure: Secondary | ICD-10-CM | POA: Diagnosis not present

## 2021-07-05 DIAGNOSIS — I5032 Chronic diastolic (congestive) heart failure: Secondary | ICD-10-CM

## 2021-07-05 DIAGNOSIS — N1832 Chronic kidney disease, stage 3b: Secondary | ICD-10-CM

## 2021-07-05 DIAGNOSIS — I25118 Atherosclerotic heart disease of native coronary artery with other forms of angina pectoris: Secondary | ICD-10-CM | POA: Diagnosis not present

## 2021-07-05 DIAGNOSIS — N1831 Chronic kidney disease, stage 3a: Secondary | ICD-10-CM | POA: Diagnosis not present

## 2021-07-05 DIAGNOSIS — I129 Hypertensive chronic kidney disease with stage 1 through stage 4 chronic kidney disease, or unspecified chronic kidney disease: Secondary | ICD-10-CM | POA: Diagnosis not present

## 2021-07-06 ENCOUNTER — Telehealth: Payer: Self-pay | Admitting: Student

## 2021-07-06 DIAGNOSIS — R600 Localized edema: Secondary | ICD-10-CM

## 2021-07-06 NOTE — Telephone Encounter (Signed)
ON-CALL CARDIOLOGY 07/06/2030  Patient's name: [name].   MRN: [id number].    DOB: 21-Jun-1940 Primary care provider: [name], MD. Primary cardiologist: Dr. Einar Gip  Interaction regarding this patient's care today: Patient's daughter called to report that since yesterday patient has been having intermittent bilateral leg edema.  States patient's legs are not swollen in the morning, however has been working in the garden frequently over the last few days and notices after coming inside his legs are swollen.  She denies dyspnea, orthopnea, chest pain.  He is currently taking Lasix 20 mg p.o. daily.  He is scheduled for office visit in 2 days.  Impression:   ICD-10-CM   1. Bilateral leg edema  R60.0       No orders of the defined types were placed in this encounter.   No orders of the defined types were placed in this encounter.   Recommendations: Advised patient to avoid high sodium intake and continue conservative management of bilateral leg edema including elevating his legs when possible.  Continue Lasix.  Further recommendations will be made at upcoming office visit.   Telephone encounter total time: 5 minutes     Alethia Berthold, PA-C 07/06/2021, 2:23 PM Office: [phone]

## 2021-07-06 NOTE — Telephone Encounter (Signed)
From patient.

## 2021-07-06 NOTE — Telephone Encounter (Signed)
ICD-10-CM   1. Bilateral leg edema  R60.0 furosemide (LASIX) 20 MG tablet    2. Chronic diastolic heart failure (HCC)  I50.32 furosemide (LASIX) 20 MG tablet    3. Stage 3b chronic kidney disease (HCC)  N18.32       Lasix increased to 40 mg daily until edema resolves. Has OV with [place] soon.  12 min in reviewing his records and medications   Adrian Prows, MD, [facility] 07/06/2021, 11:58 AM Office: [phone] Fax: [phone] Pager: [phone]

## 2021-07-07 ENCOUNTER — Encounter: Payer: Self-pay | Admitting: Cardiology

## 2021-07-07 ENCOUNTER — Ambulatory Visit: Payer: Medicare Other | Admitting: Cardiology

## 2021-07-07 VITALS — BP 118/62 | HR 62 | Temp 97.5°F | Resp 17 | Ht 64.0 in | Wt 177.0 lb

## 2021-07-07 DIAGNOSIS — I5033 Acute on chronic diastolic (congestive) heart failure: Secondary | ICD-10-CM | POA: Diagnosis not present

## 2021-07-07 DIAGNOSIS — R600 Localized edema: Secondary | ICD-10-CM | POA: Diagnosis not present

## 2021-07-07 DIAGNOSIS — N1831 Chronic kidney disease, stage 3a: Secondary | ICD-10-CM

## 2021-07-07 DIAGNOSIS — R0609 Other forms of dyspnea: Secondary | ICD-10-CM

## 2021-07-07 DIAGNOSIS — I25118 Atherosclerotic heart disease of native coronary artery with other forms of angina pectoris: Secondary | ICD-10-CM | POA: Diagnosis not present

## 2021-07-07 DIAGNOSIS — I129 Hypertensive chronic kidney disease with stage 1 through stage 4 chronic kidney disease, or unspecified chronic kidney disease: Secondary | ICD-10-CM | POA: Diagnosis not present

## 2021-07-07 DIAGNOSIS — I10 Essential (primary) hypertension: Secondary | ICD-10-CM

## 2021-07-07 DIAGNOSIS — I5032 Chronic diastolic (congestive) heart failure: Secondary | ICD-10-CM

## 2021-07-07 MED ORDER — SACUBITRIL-VALSARTAN 24-26 MG PO TABS: 1.0000 | ORAL_TABLET | Freq: Two times a day (BID) | ORAL | Status: DC

## 2021-07-07 MED ORDER — EMPAGLIFLOZIN 10 MG PO TABS: 10.0000 mg | ORAL_TABLET | Freq: Every day | ORAL | 2 refills | Status: DC

## 2021-07-07 MED ORDER — FUROSEMIDE 20 MG PO TABS: 20.0000 mg | ORAL_TABLET | ORAL | 3 refills | Status: DC

## 2021-07-07 NOTE — Progress Notes (Signed)
Primary Physician/Referring:  [name], MD  Patient ID: [name], male    DOB: 12-11-1940, 81 y.o.   MRN: [id number]  Chief Complaint  Patient presents with   Coronary Artery Disease   Shortness of Breath   Hypertension    2 MONTH   HPI:    [name]  is a 81 y.o. [place] male with coronary artery disease status post PTCA to diagonal 2 for acute MI in 2005, hypertension, hyperlipidemia, diabetes mellitus who  angiography on 05/11/2017, showed mild proximal RCA disease, moderate disease in true LAD and circumflex with negative FFR, patent D2 angioplasty site.  I had seen him in March 2023, he made a trip to [place] and just returned home about 2 weeks ago.  He has had worsening shortness of breath and severe bilateral leg edema over the past 1 week.  He is accompanied by his daughter who translates in [place].  Denies chest pain or palpitations. Past Medical History:  Diagnosis Date   Coronary artery disease    balloon angioplasty of diagonal branch of LAD in 2005   DM2 (diabetes mellitus, type 2) (HCC)    Dyslipidemia    GERD (gastroesophageal reflux disease)    Hx of pulmonary embolus    Hypertension    MI (myocardial infarction) ([facility]) 07/09/2003   anterior    Mixed hyperlipidemia 03/31/2018   OA (osteoarthritis)    Positive TB test    h/o  History reviewed. No pertinent family history. No known family history of premature CAD or sudden cardiac death.  Social History   Tobacco Use   Smoking status: Former    Types: Cigarettes    Quit date: 12/28/1994    Years since quitting: 26.5   Smokeless tobacco: Never  Substance Use Topics   Alcohol use: No  Marital Status: Married    ROS  Review of Systems  Cardiovascular:  Positive for dyspnea on exertion and leg swelling. Negative for chest pain.  Objective  Blood pressure 118/62, pulse 62, temperature (!) 97.5 F (36.4 C), temperature source Temporal, resp. rate 17, height _0  (1.626 m), weight 177 lb  (80.3 kg), SpO2 96 %.     07/07/2021    3:02 PM 04/21/2021    8:47 AM 04/21/2021    8:37 AM  Vitals with BMI  Height _1   _2   Weight 177 lbs  175 lbs 6 oz  BMI 27.51  70.01  Systolic 749 449 675  Diastolic 62 80 97  Pulse 62 76 78    Physical Exam Neck:     Vascular: JVD present. No carotid bruit.  Cardiovascular:     Rate and Rhythm: Normal rate and regular rhythm.     Pulses: Intact distal pulses.     Heart sounds: Normal heart sounds. No murmur heard.   No gallop.  Pulmonary:     Effort: Pulmonary effort is normal.     Breath sounds: Normal breath sounds.  Abdominal:     General: Bowel sounds are normal.     Palpations: Abdomen is soft.  Musculoskeletal:     Right lower leg: No tenderness. Edema (2+ pitting) present.     Left lower leg: No tenderness. Edema (2+ pitting) present.   Laboratory examination:       Latest Ref Rng & Units 07/07/2021    4:18 PM 06/22/2021    5:07 PM 05/25/2020    1:06 PM  CMP  Glucose 70 - 99 mg/dL 148   86  173    BUN 8 - 27 mg/dL _0 Creatinine 0.76 - 1.27 mg/dL 1.51   1.48   1.54    Sodium 134 - 144 mmol/L 141   142   138    Potassium 3.5 - 5.2 mmol/L 4.6   4.4   4.2    Chloride 96 - 106 mmol/L 102   101   105    CO2 20 - 29 mmol/L _1 Calcium 8.6 - 10.2 mg/dL 9.5   9.8   8.9    Total Protein 6.5 - 8.1 g/dL   7.3    Total Bilirubin 0.3 - 1.2 mg/dL   0.8    Alkaline Phos 38 - 126 U/L   81    AST 15 - 41 U/L   27    ALT 0 - 44 U/L   29     TSH Recent Labs    04/21/21 0926  TSH 2.890    BNP (last 3 results) No results for input(s): BNP in the last 8760 hours.   ProBNP (last 3 results) Recent Labs    04/21/21 0926 06/22/21 1707 07/07/21 1618  PROBNP 543* 99 250   External labs:  Labs 02/18/2021:  A1c 7.2%.  Total cholesterol 139, triglycerides 107, HDL 45, LDL 75.  Hb 12.3/HCT 36.9, platelets 154.  Normal indicis.  Serum glucose 89 mg, BUN 23, creatinine 1.84, EGFR 36,, potassium 4.8,  LFTs normal.   Medications and allergies   Allergies  Allergen Reactions   Ace Inhibitors Cough   Lipitor [Atorvastatin Calcium] Other (See Comments)    Muscle aches   Simvastatin Other (See Comments)    Muscle aches   Advicor [Niacin-Lovastatin Er] Rash     Current Outpatient Medications:    albuterol (VENTOLIN HFA) 108 (90 Base) MCG/ACT inhaler, Inhale 1 puff into the lungs as needed., Disp: , Rfl:    amLODipine (NORVASC) 10 MG tablet, TAKE 1 TABLET BY MOUTH EVERY DAY AT BREAKFAST FOR BLOOD PRESSURE, Disp: 90 tablet, Rfl: 3   aspirin 81 MG chewable tablet, Chew 1 tablet (81 mg total) by mouth daily., Disp: 60 tablet, Rfl: 6   colchicine 0.6 MG tablet, Take 0.6 mg by mouth continuous as needed. Take 0.6 mg by mouth every eight hours until acute gout flare subsides, Disp: , Rfl: 0   cyclobenzaprine (FLEXERIL) 5 MG tablet, Take 5 mg by mouth 2 (two) times daily as needed., Disp: , Rfl:    empagliflozin (JARDIANCE) 10 MG TABS tablet, Take 1 tablet (10 mg total) by mouth daily before breakfast., Disp: 30 tablet, Rfl: 2   famotidine (PEPCID) 40 MG tablet, Take 40 mg by mouth daily., Disp: , Rfl:    glimepiride (AMARYL) 1 MG tablet, Take 1 tablet by mouth daily. On hold, Disp: , Rfl:    isosorbide mononitrate (IMDUR) 60 MG 24 hr tablet, Take 60 mg by mouth daily., Disp: , Rfl:    labetalol (NORMODYNE) 200 MG tablet, TAKE ONE TABLET BY MOUTH AT BREAKFAST AND AT BEDTIME, Disp: 180 tablet, Rfl: 2   LINZESS 72 MCG capsule, Take 72 mcg by mouth every morning., Disp: , Rfl:    Multiple Vitamins-Minerals (CENTRUM SILVER 50+MEN PO), 1 tablet, Disp: , Rfl:    nitroGLYCERIN (NITROSTAT) 0.4 MG SL tablet, Place 1 tablet (0.4 mg total) under the tongue every 5 (five) minutes as needed for chest pain., Disp: 25 tablet, Rfl:  2   ondansetron (ZOFRAN) 8 MG tablet, Take 8 mg by mouth every 8 (eight) hours as needed for nausea or vomiting. , Disp: , Rfl:    pantoprazole (PROTONIX) 40 MG tablet, Take 40 mg by  mouth daily., Disp: , Rfl:    polyethylene glycol (MIRALAX / GLYCOLAX) 17 g packet, 1 packet mixed with 8 ounces of fluid, Disp: , Rfl:    Promethazine-Codeine 6.25-10 MG/5ML SOLN, Take 5 mLs by mouth 2 (two) times daily as needed., Disp: 180 mL, Rfl: 0   rosuvastatin (CRESTOR) 20 MG tablet, Take 20 mg by mouth daily. , Disp: , Rfl:    simethicone (MYLICON) 80 MG chewable tablet, 1 tablet after meals and at bedtime as needed, Disp: , Rfl:    vitamin C (VITAMIN C) 500 MG tablet, Take 1 tablet (500 mg total) by mouth 2 (two) times daily., Disp: 60 tablet, Rfl: 0   furosemide (LASIX) 20 MG tablet, Take 1 tablet (20 mg total) by mouth every morning., Disp: 90 tablet, Rfl: 3  Current Facility-Administered Medications:    sacubitril-valsartan (ENTRESTO) 24-26 mg per tablet, 1 tablet, Oral, BID, Adrian Prows, MD     Radiology:   Chest X-Ray 01/07/2014: Normal chest x-ray.  Mild cardiomegaly.  No active cardiopulmonary disease.  Cardiac Studies:   Coronary angiogram 05/11/2017: Moderate nonobstructive coronary artery disease. No true left main. Ostial 50%, mid 30-40% stenoses. FFR 0.83. Medium sized diagonal with diffuse 60% disease Ostial LCx 40% with resting Pd/Pa 0.98. Mid 20-30% disease Prox RCA 40% stenosis. Normal LVEDP, LVEF 55-65%.  PCV MYOCARDIAL PERFUSION WO LEXISCAN 03/24/2020 Normal ECG stress. The patient exercised for 5 minutes and 18 seconds of a Bruce protocol, achieving approximately 7.05 METs.  Stress terminated due to dyspnea. Resting EKG normal sinus rhythm, stress EKG no ST-T wave changes with stress testing.  Frequent PVCs were evident through the exercise and in recovery. Myocardial perfusion is abnormal. There is a moderate-sized scar noted in the anterior and anterolateral and apical lateral region with moderate peri-infarct ischemia. Gated SPECT imaging of the left ventricle was abnormal, demonstrating akinesis of the apical anterior wall, apical lateral wall and mid anterior  wall.  Stress LV EF is moderately dysfunctional 37%. Compared to 10/18/2012, moderate peri-infarct ischemia is new.  Previously scar was noted in similar region.  Intermediate risk.  PCV ECHOCARDIOGRAM COMPLETE 06/29/2021  Narrative Echocardiogram 06/29/2021: Left ventricle cavity is normal in size. Moderate concentric hypertrophy of the left ventricle. Normal global wall motion. Normal LV systolic function with EF 71%. Doppler evidence of grade I (impaired) diastolic dysfunction, normal LAP. Trileaflet aortic valve. Mild aortic valve leaflet calcification. Trace aortic regurgitation. Moderate tricuspid regurgitation. Mild pulmonic regurgitation. No evidence of pulmonary hypertension. Compared to previous study on 02/12/2020, mod TR is new.   EKG   EKG 07/07/2021: Sinus bradycardia at rate of 54 bpm, poor R wave progression, cannot exclude anteroseptal infarct 4.  Nonspecific T abnormality.  Single PAC.  EKG 04/21/2021: Normal sinus rhythm at rate of 72 bpm, left atrial enlargement, left axis deviation, left anterior fascicular block.  LVH.  Nonspecific T wave flattening.  No significant change from 02/04/2022.  Assessment     ICD-10-CM   1. Acute on chronic diastolic heart failure (HCC)  I50.33 PCV MYOCARDIAL PERFUSION WITH LEXISCAN    sacubitril-valsartan (ENTRESTO) 24-26 mg per tablet    empagliflozin (JARDIANCE) 10 MG TABS tablet    furosemide (LASIX) 20 MG tablet    Basic metabolic panel    Pro b  natriuretic peptide (BNP)    Pro b natriuretic peptide (BNP)    Basic metabolic panel    Pro b natriuretic peptide (BNP)    Basic metabolic panel    2. Coronary artery disease of native artery of native heart with stable angina pectoris (HCC)  I25.118 EKG 12-Lead    PCV MYOCARDIAL PERFUSION WITH LEXISCAN    3. Stage 3a chronic kidney disease (HCC)  N18.31     4. Essential hypertension  I10     5. Bilateral leg edema  R60.0       Meds ordered this encounter  Medications    sacubitril-valsartan (ENTRESTO) 24-26 mg per tablet    Order Specific Question:   ACE-inhibitors have NOT been administered in the past 36-hours.    Answer:   YES (confirmed by ordering provider)   empagliflozin (JARDIANCE) 10 MG TABS tablet    Sig: Take 1 tablet (10 mg total) by mouth daily before breakfast.    Dispense:  30 tablet    Refill:  2   furosemide (LASIX) 20 MG tablet    Sig: Take 1 tablet (20 mg total) by mouth every morning.    Dispense:  90 tablet    Refill:  3    Medications Discontinued During This Encounter  Medication Reason   glucosamine-chondroitin 500-400 MG tablet    predniSONE (DELTASONE) 20 MG tablet    losartan (COZAAR) 50 MG tablet Change in therapy   hydrALAZINE (APRESOLINE) 25 MG tablet Discontinued by provider   furosemide (LASIX) 20 MG tablet Reorder   Recommendations:   Travis Mastel  is a 81 y.o.  [place] male with coronary artery disease status post PTCA to diagonal 2 for acute MI in 2005, hypertension, hyperlipidemia, diabetes mellitus who  angiography on 05/11/2017, showed mild proximal RCA disease, moderate disease in true LAD and circumflex with negative FFR, patent D2 angioplasty site.    Acute visit. I had seen him in March 2023, he made a trip to [place] and just returned home about 2 weeks ago.  He has had worsening shortness of breath and severe bilateral leg edema over the past 1 week.  He is accompanied by his daughter who translates in [place].  Denies chest pain or palpitations.  He is in acute decompensated heart failure, I discontinued his losartan and switch him to Entresto 49/51 mg dose, also started him on Jardiance 10 mg daily and reduce the dose of furosemide from 40 mg daily to 20 mg daily.  Reviewed his labs from today, NT proBNP elevated from baseline when he was euvolemic although still within normal range.  Still suspect he has acute decompensated diastolic heart failure.  Renal function has remained stable.  He will need repeat labs  and follow-up closely, would like to see him back in 10 to 12 days.  Ischemic etiology in view of uncontrolled diabetes mellitus, advanced age and hypertension and hyperlipidemia needs to be evaluated in view of new decompensated heart failure, I will schedule for Lexiscan nuclear stress test.  Extensive discussion with the daughter regarding lifestyle changes and especially to be careful with the diet.  Patient eats predominantly Asian diet however excess salt in diet discussed.  Was a 40-minute office visit encounter.   Adrian Prows, MD, [facility] 07/08/2021, 6:20 AM Office: [phone] Fax: [phone] Pager: [phone]

## 2021-07-08 ENCOUNTER — Encounter: Payer: Self-pay | Admitting: Cardiology

## 2021-07-08 ENCOUNTER — Ambulatory Visit: Payer: Medicare Other | Admitting: Cardiology

## 2021-07-08 LAB — BASIC METABOLIC PANEL
BUN/Creatinine Ratio: 11 (ref 10–24)
BUN: 17 mg/dL (ref 8–27)
CO2: 24 mmol/L (ref 20–29)
Calcium: 9.5 mg/dL (ref 8.6–10.2)
Chloride: 102 mmol/L (ref 96–106)
Creatinine, Ser: 1.51 mg/dL — ABNORMAL HIGH (ref 0.76–1.27)
Glucose: 148 mg/dL — ABNORMAL HIGH (ref 70–99)
Potassium: 4.6 mmol/L (ref 3.5–5.2)
Sodium: 141 mmol/L (ref 134–144)
eGFR: 46 mL/min/{1.73_m2} — ABNORMAL LOW (ref >59)

## 2021-07-08 LAB — PRO B NATRIURETIC PEPTIDE: NT-Pro BNP: 250 pg/mL (ref 0–486)

## 2021-07-18 DIAGNOSIS — I5033 Acute on chronic diastolic (congestive) heart failure: Secondary | ICD-10-CM | POA: Diagnosis not present

## 2021-07-19 LAB — BASIC METABOLIC PANEL
BUN/Creatinine Ratio: 17 (ref 10–24)
BUN: 29 mg/dL — ABNORMAL HIGH (ref 8–27)
CO2: 21 mmol/L (ref 20–29)
Calcium: 9.4 mg/dL (ref 8.6–10.2)
Chloride: 102 mmol/L (ref 96–106)
Creatinine, Ser: 1.75 mg/dL — ABNORMAL HIGH (ref 0.76–1.27)
Glucose: 241 mg/dL — ABNORMAL HIGH (ref 70–99)
Potassium: 4.3 mmol/L (ref 3.5–5.2)
Sodium: 139 mmol/L (ref 134–144)
eGFR: 38 mL/min/{1.73_m2} — ABNORMAL LOW (ref >59)

## 2021-07-19 LAB — PRO B NATRIURETIC PEPTIDE: NT-Pro BNP: 84 pg/mL (ref 0–486)

## 2021-07-20 ENCOUNTER — Encounter: Payer: Self-pay | Admitting: Cardiology

## 2021-07-20 ENCOUNTER — Ambulatory Visit: Payer: Medicare Other | Admitting: Cardiology

## 2021-07-20 VITALS — BP 118/63 | HR 59 | Temp 97.6°F | Resp 16 | Ht 64.0 in | Wt 172.6 lb

## 2021-07-20 DIAGNOSIS — N1832 Chronic kidney disease, stage 3b: Secondary | ICD-10-CM

## 2021-07-20 DIAGNOSIS — I25118 Atherosclerotic heart disease of native coronary artery with other forms of angina pectoris: Secondary | ICD-10-CM | POA: Diagnosis not present

## 2021-07-20 DIAGNOSIS — I5032 Chronic diastolic (congestive) heart failure: Secondary | ICD-10-CM | POA: Diagnosis not present

## 2021-07-20 DIAGNOSIS — I5033 Acute on chronic diastolic (congestive) heart failure: Secondary | ICD-10-CM

## 2021-07-20 DIAGNOSIS — N1831 Chronic kidney disease, stage 3a: Secondary | ICD-10-CM

## 2021-07-20 MED ORDER — EMPAGLIFLOZIN 10 MG PO TABS: 10.0000 mg | ORAL_TABLET | Freq: Every day | ORAL | 2 refills | Status: DC

## 2021-07-20 MED ORDER — FUROSEMIDE 20 MG PO TABS: 20.0000 mg | ORAL_TABLET | Freq: Every day | ORAL | 3 refills | Status: DC | PRN

## 2021-07-20 MED ORDER — FUROSEMIDE 20 MG PO TABS: 20.0000 mg | ORAL_TABLET | Freq: Every day | ORAL | 1 refills | Status: DC | PRN

## 2021-07-20 MED ORDER — LOSARTAN POTASSIUM 25 MG PO TABS: 25.0000 mg | ORAL_TABLET | Freq: Every day | ORAL | Status: DC

## 2021-07-20 MED ORDER — SACUBITRIL-VALSARTAN 49-51 MG PO TABS: 1.0000 | ORAL_TABLET | Freq: Two times a day (BID) | ORAL | Status: DC

## 2021-07-20 MED ORDER — SACUBITRIL-VALSARTAN 97-103 MG PO TABS: 1.0000 | ORAL_TABLET | Freq: Two times a day (BID) | ORAL | 3 refills | Status: DC

## 2021-07-20 NOTE — Progress Notes (Signed)
Primary Physician/Referring:  [name], MD  Patient ID: [name], male    DOB: 27-Aug-1940, 81 y.o.   MRN: [id number]  Chief Complaint  Patient presents with   Coronary Artery Disease   Acute on chronic diastolic heart failure    Follow-up    12 days   HPI:    [name]  is a 81 y.o. [place] male (does not speak [place], his daughters transcribe) with coronary artery disease status post PTCA to diagonal 2 for acute MI in 2005, hypertension, hyperlipidemia, diabetes mellitus who  angiography on 05/11/2017, showed mild proximal RCA disease, moderate disease in the mid LAD and circumflex with negative FFR, patent D2 angioplasty site.  For worsening dyspnea, marked 3+ bilateral leg edema after returning from a trip to [place], suspected acute decompensated heart failure.  Since being on Entresto, Jardiance 10 mg daily which was started both for heart failure and uncontrolled diabetes mellitus, strict diet with salt reduction, he has had complete resolution of his leg edema, dyspnea has improved.  Denies chest pain or palpitations.  Past Medical History:  Diagnosis Date   Coronary artery disease    balloon angioplasty of diagonal branch of LAD in 2005   DM2 (diabetes mellitus, type 2) (HCC)    Dyslipidemia    GERD (gastroesophageal reflux disease)    Hx of pulmonary embolus    Hypertension    MI (myocardial infarction) ([place]) 07/09/2003   anterior    Mixed hyperlipidemia 03/31/2018   OA (osteoarthritis)    Positive TB test    h/o  History reviewed. No pertinent family history. No known family history of premature CAD or sudden cardiac death.  Social History   Tobacco Use   Smoking status: Former    Types: Cigarettes    Quit date: 12/28/1994    Years since quitting: 26.5   Smokeless tobacco: Never  Substance Use Topics   Alcohol use: No  Marital Status: Married    ROS  Review of Systems  Cardiovascular:  Negative for chest pain, dyspnea on exertion and leg  swelling.   Objective  Blood pressure 118/63, pulse (!) 59, temperature 97.6 F (36.4 C), temperature source Temporal, resp. rate 16, height 5' 4"  (1.626 m), weight 172 lb 9.6 oz (78.3 kg).     07/20/2021   10:52 AM 07/07/2021    3:02 PM 04/21/2021    8:47 AM  Vitals with BMI  Height 5' 4"  5' 4"    Weight 172 lbs 10 oz 177 lbs   BMI 95.28 41.32   Systolic 440 102 725  Diastolic 63 62 80  Pulse 59 62 76    Physical Exam Neck:     Vascular: No carotid bruit or JVD.  Cardiovascular:     Rate and Rhythm: Normal rate and regular rhythm.     Pulses: Intact distal pulses.     Heart sounds: Normal heart sounds. No murmur heard.    No gallop.  Pulmonary:     Effort: Pulmonary effort is normal.     Breath sounds: Normal breath sounds.  Abdominal:     General: Bowel sounds are normal.     Palpations: Abdomen is soft.  Musculoskeletal:     Right lower leg: No tenderness. No edema.     Left lower leg: No tenderness. No edema.    Laboratory examination:     Chemistry    Lab Results  Component Value Date   NA 139 07/18/2021   K 4.3 07/18/2021   CO2  21 07/18/2021   BUN 29 (H) 07/18/2021   CREATININE 1.75 (H) 07/18/2021   CALCIUM 9.4 07/18/2021   GLUCOSE 241 (H) 07/18/2021   Estimated Creatinine Clearance: 30.7 mL/min (A) (by C-G formula based on SCr of 1.75 mg/dL (H)).      Latest Ref Rng & Units 07/18/2021   12:59 PM 07/07/2021    4:18 PM 06/22/2021    5:07 PM  CMP  Glucose 70 - 99 mg/dL 241  148  86   BUN 8 - 27 mg/dL 29  17  16    Creatinine 0.76 - 1.27 mg/dL 1.75  1.51  1.48   Sodium 134 - 144 mmol/L 139  141  142   Potassium 3.5 - 5.2 mmol/L 4.3  4.6  4.4   Chloride 96 - 106 mmol/L 102  102  101   CO2 20 - 29 mmol/L 21  24  25    Calcium 8.6 - 10.2 mg/dL 9.4  9.5  9.8    TSH Recent Labs    04/21/21 0926  TSH 2.890    BNP (last 3 results) No results for input(s): "BNP" in the last 8760 hours.   ProBNP (last 3 results) Recent Labs    06/22/21 1707  07/07/21 1618 07/18/21 1258  PROBNP 99 250 84   External labs:  Labs 02/18/2021:  A1c 7.2%.  Total cholesterol 139, triglycerides 107, HDL 45, LDL 75.  Hb 12.3/HCT 36.9, platelets 154.  Normal indicis.  Serum glucose 89 mg, BUN 23, creatinine 1.84, EGFR 36,, potassium 4.8, LFTs normal.   Medications and allergies   Allergies  Allergen Reactions   Ace Inhibitors Cough   Lipitor [Atorvastatin Calcium] Other (See Comments)    Muscle aches   Simvastatin Other (See Comments)    Muscle aches   Advicor [Niacin-Lovastatin Er] Rash     Current Outpatient Medications:    albuterol (VENTOLIN HFA) 108 (90 Base) MCG/ACT inhaler, Inhale 1 puff into the lungs as needed., Disp: , Rfl:    aspirin 81 MG chewable tablet, Chew 1 tablet (81 mg total) by mouth daily., Disp: 60 tablet, Rfl: 6   colchicine 0.6 MG tablet, Take 0.6 mg by mouth continuous as needed. Take 0.6 mg by mouth every eight hours until acute gout flare subsides, Disp: , Rfl: 0   cyclobenzaprine (FLEXERIL) 5 MG tablet, Take 5 mg by mouth 2 (two) times daily as needed., Disp: , Rfl:    famotidine (PEPCID) 40 MG tablet, Take 40 mg by mouth daily., Disp: , Rfl:    glimepiride (AMARYL) 1 MG tablet, Take 1 tablet by mouth daily. On hold, Disp: , Rfl:    isosorbide mononitrate (IMDUR) 60 MG 24 hr tablet, Take 60 mg by mouth daily., Disp: , Rfl:    labetalol (NORMODYNE) 200 MG tablet, TAKE ONE TABLET BY MOUTH AT BREAKFAST AND AT BEDTIME, Disp: 180 tablet, Rfl: 2   LINZESS 72 MCG capsule, Take 72 mcg by mouth every morning., Disp: , Rfl:    Multiple Vitamins-Minerals (CENTRUM SILVER 50+MEN PO), 1 tablet, Disp: , Rfl:    nitroGLYCERIN (NITROSTAT) 0.4 MG SL tablet, Place 1 tablet (0.4 mg total) under the tongue every 5 (five) minutes as needed for chest pain., Disp: 25 tablet, Rfl: 2   ondansetron (ZOFRAN) 8 MG tablet, Take 8 mg by mouth every 8 (eight) hours as needed for nausea or vomiting. , Disp: , Rfl:    pantoprazole (PROTONIX) 40 MG  tablet, Take 40 mg by mouth daily., Disp: , Rfl:  polyethylene glycol (MIRALAX / GLYCOLAX) 17 g packet, 1 packet mixed with 8 ounces of fluid, Disp: , Rfl:    Promethazine-Codeine 6.25-10 MG/5ML SOLN, Take 5 mLs by mouth 2 (two) times daily as needed., Disp: 180 mL, Rfl: 0   rosuvastatin (CRESTOR) 20 MG tablet, Take 20 mg by mouth daily. , Disp: , Rfl:    sacubitril-valsartan (ENTRESTO) 97-103 MG, Take 1 tablet by mouth 2 (two) times daily., Disp: 60 tablet, Rfl: 3   simethicone (MYLICON) 80 MG chewable tablet, 1 tablet after meals and at bedtime as needed, Disp: , Rfl:    vitamin C (VITAMIN C) 500 MG tablet, Take 1 tablet (500 mg total) by mouth 2 (two) times daily., Disp: 60 tablet, Rfl: 0   empagliflozin (JARDIANCE) 10 MG TABS tablet, Take 1 tablet (10 mg total) by mouth daily before breakfast., Disp: 30 tablet, Rfl: 2   furosemide (LASIX) 20 MG tablet, Take 1 tablet (20 mg total) by mouth daily as needed for fluid or edema., Disp: 90 tablet, Rfl: 1  Current Facility-Administered Medications:    sacubitril-valsartan (ENTRESTO) 49-51 mg per tablet, 1 tablet, Oral, BID, Adrian Prows, MD     Radiology:   Chest X-Ray 01/07/2014: Normal chest x-ray.  Mild cardiomegaly.  No active cardiopulmonary disease.  Cardiac Studies:   Coronary angiogram 05/11/2017: Moderate nonobstructive coronary artery disease. No true left main. Ostial 50%, mid 30-40% stenoses. FFR 0.83. Medium sized diagonal with diffuse 60% disease Ostial LCx 40% with resting Pd/Pa 0.98. Mid 20-30% disease Prox RCA 40% stenosis. Normal LVEDP, LVEF 55-65%.  PCV MYOCARDIAL PERFUSION WO LEXISCAN 03/24/2020 Normal ECG stress. The patient exercised for 5 minutes and 18 seconds of a Bruce protocol, achieving approximately 7.05 METs.  Stress terminated due to dyspnea. Resting EKG normal sinus rhythm, stress EKG no ST-T wave changes with stress testing.  Frequent PVCs were evident through the exercise and in recovery. Myocardial perfusion is  abnormal. There is a moderate-sized scar noted in the anterior and anterolateral and apical lateral region with moderate peri-infarct ischemia. Gated SPECT imaging of the left ventricle was abnormal, demonstrating akinesis of the apical anterior wall, apical lateral wall and mid anterior wall.  Stress LV EF is moderately dysfunctional 37%. Compared to 10/18/2012, moderate peri-infarct ischemia is new.  Previously scar was noted in similar region.  Intermediate risk.  PCV ECHOCARDIOGRAM COMPLETE 06/29/2021  Left ventricle cavity is normal in size. Moderate concentric hypertrophy of the left ventricle. Normal global wall motion. Normal LV systolic function with EF 71%. Doppler evidence of grade I (impaired) diastolic dysfunction, normal LAP. Trileaflet aortic valve. Mild aortic valve leaflet calcification. Trace aortic regurgitation. Moderate tricuspid regurgitation. Mild pulmonic regurgitation. No evidence of pulmonary hypertension. Compared to previous study on 02/12/2020, mod TR is new.   EKG   EKG 07/07/2021: Sinus bradycardia at rate of 54 bpm, poor R wave progression, cannot exclude anteroseptal infarct 4.  Nonspecific T abnormality.  Single PAC.  EKG 04/21/2021: Normal sinus rhythm at rate of 72 bpm, left atrial enlargement, left axis deviation, left anterior fascicular block.  LVH.  Nonspecific T wave flattening.  No significant change from 02/04/2022.  Assessment     ICD-10-CM   1. Acute on chronic diastolic heart failure (HCC)  I50.33 empagliflozin (JARDIANCE) 10 MG TABS tablet    2. Coronary artery disease of native artery of native heart with stable angina pectoris (HCC)  I25.118 furosemide (LASIX) 20 MG tablet    DISCONTINUED: losartan (COZAAR) 25 MG tablet    DISCONTINUED:  furosemide (LASIX) 20 MG tablet    3. Stage 3a chronic kidney disease (HCC)  N18.31 DISCONTINUED: losartan (COZAAR) 25 MG tablet    4. Chronic diastolic heart failure (HCC)  I50.32 sacubitril-valsartan  (ENTRESTO) 49-51 mg per tablet    Basic metabolic panel    sacubitril-valsartan (ENTRESTO) 97-103 MG      Meds ordered this encounter  Medications   DISCONTD: losartan (COZAAR) 25 MG tablet    Sig: Take 1 tablet (25 mg total) by mouth at bedtime.   DISCONTD: furosemide (LASIX) 20 MG tablet    Sig: Take 1 tablet (20 mg total) by mouth daily as needed for fluid or edema.    Dispense:  90 tablet    Refill:  3   sacubitril-valsartan (ENTRESTO) 49-51 mg per tablet    Order Specific Question:   ACE-inhibitors have NOT been administered in the past 36-hours.    Answer:   YES (confirmed by ordering provider)   furosemide (LASIX) 20 MG tablet    Sig: Take 1 tablet (20 mg total) by mouth daily as needed for fluid or edema.    Dispense:  90 tablet    Refill:  1   sacubitril-valsartan (ENTRESTO) 97-103 MG    Sig: Take 1 tablet by mouth 2 (two) times daily.    Dispense:  60 tablet    Refill:  3   empagliflozin (JARDIANCE) 10 MG TABS tablet    Sig: Take 1 tablet (10 mg total) by mouth daily before breakfast.    Dispense:  30 tablet    Refill:  2    Medications Discontinued During This Encounter  Medication Reason   amLODipine (NORVASC) 10 MG tablet Discontinued by provider   furosemide (LASIX) 20 MG tablet    sacubitril-valsartan (ENTRESTO) 24-26 mg per tablet    losartan (COZAAR) 25 MG tablet Entry Error   empagliflozin (JARDIANCE) 10 MG TABS tablet Reorder   furosemide (LASIX) 20 MG tablet    Recommendations:   Richey Doolittle  is a 81 y.o.  [place] male (does not speak [place], his daughters transcribe) with coronary artery disease status post PTCA to diagonal 2 for acute MI in 2005, hypertension, hyperlipidemia, diabetes mellitus who  angiography on 05/11/2017, showed mild proximal RCA disease, moderate disease in the mid LAD and circumflex with negative FFR, patent D2 angioplasty site.  For worsening dyspnea, marked 3+ bilateral leg edema after returning from a trip to [place], suspected  acute decompensated heart failure.  Since being on Entresto, Jardiance 10 mg daily which was started both for heart failure and uncontrolled diabetes mellitus, strict diet with salt reduction, he has had complete resolution of his leg edema, dyspnea has improved.  Reviewed his labs, serum creatinine has gradually worsened in view of aggressive diuresis.  We will change furosemide from 20 mg daily to as needed use for leg edema and worsening dyspnea, continue Jardiance 10 mg daily, could consider increasing it to 25 mg depending upon his A1c, discontinue amlodipine as his blood pressure is now well controlled and increase Entresto to moderate dose at 49/51 mg twice daily.  Strict instructions given to obtain BMP 10 days after starting higher dose to follow-up on serum creatinine.  He is scheduled for nuclear stress test, I will see him back in 4 weeks.    Adrian Prows, MD, [facility] 07/21/2021, 6:09 AM Office: [phone] Fax: [phone] Pager: [phone]

## 2021-08-01 ENCOUNTER — Encounter: Payer: Self-pay | Admitting: Cardiology

## 2021-08-01 ENCOUNTER — Ambulatory Visit: Payer: Medicare Other

## 2021-08-01 DIAGNOSIS — I25118 Atherosclerotic heart disease of native coronary artery with other forms of angina pectoris: Secondary | ICD-10-CM | POA: Diagnosis not present

## 2021-08-01 DIAGNOSIS — I5033 Acute on chronic diastolic (congestive) heart failure: Secondary | ICD-10-CM | POA: Diagnosis not present

## 2021-08-02 NOTE — Telephone Encounter (Signed)
From patient.

## 2021-08-15 ENCOUNTER — Encounter: Payer: Self-pay | Admitting: Cardiology

## 2021-08-15 ENCOUNTER — Ambulatory Visit: Payer: Medicare Other | Admitting: Cardiology

## 2021-08-15 VITALS — BP 124/68 | HR 71 | Temp 97.9°F | Resp 16 | Ht 64.0 in | Wt 171.4 lb

## 2021-08-15 DIAGNOSIS — I5032 Chronic diastolic (congestive) heart failure: Secondary | ICD-10-CM | POA: Diagnosis not present

## 2021-08-15 DIAGNOSIS — N1832 Chronic kidney disease, stage 3b: Secondary | ICD-10-CM

## 2021-08-15 DIAGNOSIS — I25118 Atherosclerotic heart disease of native coronary artery with other forms of angina pectoris: Secondary | ICD-10-CM | POA: Diagnosis not present

## 2021-08-15 DIAGNOSIS — I10 Essential (primary) hypertension: Secondary | ICD-10-CM | POA: Diagnosis not present

## 2021-08-15 MED ORDER — ENTRESTO 49-51 MG PO TABS: 1.0000 | ORAL_TABLET | Freq: Two times a day (BID) | ORAL | 3 refills | Status: DC

## 2021-08-15 NOTE — Progress Notes (Unsigned)
Primary Physician/Referring:  [name], MD  Patient ID: [name], male    DOB: 1940/08/01, 81 y.o.   MRN: [id number]  Chief Complaint  Patient presents with   Congestive Heart Failure   Coronary Artery Disease    4 weeks   HPI:    [name]  is a 81 y.o. [place] male (does not speak [place], his daughters transcribe) with coronary artery disease status post PTCA to diagonal 2 for acute MI in 2005, hypertension, hyperlipidemia, diabetes mellitus who  angiography on 05/11/2017, showed mild proximal RCA disease, moderate disease in the mid LAD and circumflex with negative FFR, patent D2 angioplasty site.  For worsening dyspnea, marked 3+ bilateral leg edema after returning from a trip to [place], suspected acute decompensated heart failure.  Since being on Entresto, Jardiance 10 mg daily which was started both for heart failure and uncontrolled diabetes mellitus, strict diet with salt reduction, he has had complete resolution of his leg edema, dyspnea has improved.  Denies chest pain or palpitations.  Past Medical History:  Diagnosis Date   Coronary artery disease    balloon angioplasty of diagonal branch of LAD in 2005   DM2 (diabetes mellitus, type 2) (HCC)    Dyslipidemia    GERD (gastroesophageal reflux disease)    Hx of pulmonary embolus    Hypertension    MI (myocardial infarction) ([place]) 07/09/2003   anterior    Mixed hyperlipidemia 03/31/2018   OA (osteoarthritis)    Positive TB test    h/o  History reviewed. No pertinent family history. No known family history of premature CAD or sudden cardiac death.  Social History   Tobacco Use   Smoking status: Former    Types: Cigarettes    Quit date: 12/28/1994    Years since quitting: 26.6   Smokeless tobacco: Never  Substance Use Topics   Alcohol use: No  Marital Status: Married    ROS  Review of Systems  Cardiovascular:  Negative for chest pain, dyspnea on exertion and leg swelling.   Objective  Blood  pressure 124/68, pulse 71, temperature 97.9 F (36.6 C), temperature source Temporal, resp. rate 16, height 5' 4"  (1.626 m), weight 171 lb 6.4 oz (77.7 kg), SpO2 95 %.     08/15/2021    9:59 AM 07/20/2021   10:52 AM 07/07/2021    3:02 PM  Vitals with BMI  Height 5' 4"  5' 4"  5' 4"   Weight 171 lbs 6 oz 172 lbs 10 oz 177 lbs  BMI 29.41 51.70 01.74  Systolic 944 967 591  Diastolic 68 63 62  Pulse 71 59 62    Physical Exam Neck:     Vascular: No carotid bruit or JVD.  Cardiovascular:     Rate and Rhythm: Normal rate and regular rhythm.     Pulses: Intact distal pulses.     Heart sounds: Normal heart sounds. No murmur heard.    No gallop.  Pulmonary:     Effort: Pulmonary effort is normal.     Breath sounds: Normal breath sounds.  Abdominal:     General: Bowel sounds are normal.     Palpations: Abdomen is soft.  Musculoskeletal:     Right lower leg: No tenderness. No edema.     Left lower leg: No tenderness. No edema.    Laboratory examination:     Chemistry    Lab Results  Component Value Date   NA 139 07/18/2021   K 4.3 07/18/2021   CO2 21  07/18/2021   BUN 29 (H) 07/18/2021   CREATININE 1.75 (H) 07/18/2021   CALCIUM 9.4 07/18/2021   GLUCOSE 241 (H) 07/18/2021   CrCl cannot be calculated (Patient's most recent lab result is older than the maximum 21 days allowed.).      Latest Ref Rng & Units 07/18/2021   12:59 PM 07/07/2021    4:18 PM 06/22/2021    5:07 PM  CMP  Glucose 70 - 99 mg/dL 241  148  86   BUN 8 - 27 mg/dL 29  17  16    Creatinine 0.76 - 1.27 mg/dL 1.75  1.51  1.48   Sodium 134 - 144 mmol/L 139  141  142   Potassium 3.5 - 5.2 mmol/L 4.3  4.6  4.4   Chloride 96 - 106 mmol/L 102  102  101   CO2 20 - 29 mmol/L 21  24  25    Calcium 8.6 - 10.2 mg/dL 9.4  9.5  9.8    TSH Recent Labs    04/21/21 0926  TSH 2.890     BNP (last 3 results) No results for input(s): "BNP" in the last 8760 hours.   ProBNP (last 3 results) Recent Labs    06/22/21 1707  07/07/21 1618 07/18/21 1258  PROBNP 99 250 84    External labs:  Labs 02/18/2021:  A1c 7.2%.  Total cholesterol 139, triglycerides 107, HDL 45, LDL 75.  Hb 12.3/HCT 36.9, platelets 154.  Normal indicis.  Serum glucose 89 mg, BUN 23, creatinine 1.84, EGFR 36,, potassium 4.8, LFTs normal.   Medications and allergies   Allergies  Allergen Reactions   Ace Inhibitors Cough   Lipitor [Atorvastatin Calcium] Other (See Comments)    Muscle aches   Simvastatin Other (See Comments)    Muscle aches   Advicor [Niacin-Lovastatin Er] Rash     Current Outpatient Medications:    albuterol (VENTOLIN HFA) 108 (90 Base) MCG/ACT inhaler, Inhale 1 puff into the lungs as needed., Disp: , Rfl:    aspirin 81 MG chewable tablet, Chew 1 tablet (81 mg total) by mouth daily., Disp: 60 tablet, Rfl: 6   colchicine 0.6 MG tablet, Take 0.6 mg by mouth continuous as needed. Take 0.6 mg by mouth every eight hours until acute gout flare subsides, Disp: , Rfl: 0   cyclobenzaprine (FLEXERIL) 5 MG tablet, Take 5 mg by mouth 2 (two) times daily as needed., Disp: , Rfl:    empagliflozin (JARDIANCE) 10 MG TABS tablet, Take 1 tablet (10 mg total) by mouth daily before breakfast., Disp: 30 tablet, Rfl: 2   famotidine (PEPCID) 40 MG tablet, Take 40 mg by mouth daily., Disp: , Rfl:    glimepiride (AMARYL) 1 MG tablet, Take 1 tablet by mouth daily. On hold, Disp: , Rfl:    isosorbide mononitrate (IMDUR) 60 MG 24 hr tablet, Take 60 mg by mouth daily., Disp: , Rfl:    labetalol (NORMODYNE) 200 MG tablet, TAKE ONE TABLET BY MOUTH AT BREAKFAST AND AT BEDTIME, Disp: 180 tablet, Rfl: 2   LINZESS 72 MCG capsule, Take 72 mcg by mouth every morning., Disp: , Rfl:    nitroGLYCERIN (NITROSTAT) 0.4 MG SL tablet, Place 1 tablet (0.4 mg total) under the tongue every 5 (five) minutes as needed for chest pain., Disp: 25 tablet, Rfl: 2   ondansetron (ZOFRAN) 8 MG tablet, Take 8 mg by mouth every 8 (eight) hours as needed for nausea or  vomiting. , Disp: , Rfl:    pantoprazole (PROTONIX) 40  MG tablet, Take 40 mg by mouth daily., Disp: , Rfl:    polyethylene glycol (MIRALAX / GLYCOLAX) 17 g packet, 1 packet mixed with 8 ounces of fluid, Disp: , Rfl:    Promethazine-Codeine 6.25-10 MG/5ML SOLN, Take 5 mLs by mouth 2 (two) times daily as needed., Disp: 180 mL, Rfl: 0   rosuvastatin (CRESTOR) 20 MG tablet, Take 20 mg by mouth daily. , Disp: , Rfl:    simethicone (MYLICON) 80 MG chewable tablet, 1 tablet after meals and at bedtime as needed, Disp: , Rfl:    vitamin C (VITAMIN C) 500 MG tablet, Take 1 tablet (500 mg total) by mouth 2 (two) times daily., Disp: 60 tablet, Rfl: 0  Current Facility-Administered Medications:    sacubitril-valsartan (ENTRESTO) 49-51 mg per tablet, 1 tablet, Oral, BID, Bryan Prows, MD     Radiology:   Chest X-Ray 01/07/2014: Normal chest x-ray.  Mild cardiomegaly.  No active cardiopulmonary disease.  Cardiac Studies:   Coronary angiogram 05/11/2017: Moderate nonobstructive coronary artery disease. No true left main. Ostial 50%, mid 30-40% stenoses. FFR 0.83. Medium sized diagonal with diffuse 60% disease Ostial LCx 40% with resting Pd/Pa 0.98. Mid 20-30% disease Prox RCA 40% stenosis. Normal LVEDP, LVEF 55-65%.  PCV MYOCARDIAL PERFUSION WO LEXISCAN 03/24/2020 Normal ECG stress. The patient exercised for 5 minutes and 18 seconds of a Bruce protocol, achieving approximately 7.05 METs.  Stress terminated due to dyspnea. Resting EKG normal sinus rhythm, stress EKG no ST-T wave changes with stress testing.  Frequent PVCs were evident through the exercise and in recovery. Myocardial perfusion is abnormal. There is a moderate-sized scar noted in the anterior and anterolateral and apical lateral region with moderate peri-infarct ischemia. Gated SPECT imaging of the left ventricle was abnormal, demonstrating akinesis of the apical anterior wall, apical lateral wall and mid anterior wall.  Stress LV EF is  moderately dysfunctional 37%. Compared to 10/18/2012, moderate peri-infarct ischemia is new.  Previously scar was noted in similar region.  Intermediate risk.  PCV ECHOCARDIOGRAM COMPLETE 06/29/2021  Left ventricle cavity is normal in size. Moderate concentric hypertrophy of the left ventricle. Normal global wall motion. Normal LV systolic function with EF 71%. Doppler evidence of grade I (impaired) diastolic dysfunction, normal LAP. Trileaflet aortic valve. Mild aortic valve leaflet calcification. Trace aortic regurgitation. Moderate tricuspid regurgitation. Mild pulmonic regurgitation. No evidence of pulmonary hypertension. Compared to previous study on 02/12/2020, mod TR is new.   EKG   EKG 07/07/2021: Sinus bradycardia at rate of 54 bpm, poor R wave progression, cannot exclude anteroseptal infarct 4.  Nonspecific T abnormality.  Single PAC.  EKG 04/21/2021: Normal sinus rhythm at rate of 72 bpm, left atrial enlargement, left axis deviation, left anterior fascicular block.  LVH.  Nonspecific T wave flattening.  No significant change from 02/04/2022.  Assessment     ICD-10-CM   1. Coronary artery disease of native artery of native heart with stable angina pectoris ([place])  I25.118     2. Chronic diastolic heart failure (HCC)  I50.32     3. Stage 3b chronic kidney disease (HCC)  N18.32     4. Essential hypertension  I10       No orders of the defined types were placed in this encounter.   Medications Discontinued During This Encounter  Medication Reason   furosemide (LASIX) 20 MG tablet    Multiple Vitamins-Minerals (CENTRUM SILVER 50+MEN PO)    sacubitril-valsartan (ENTRESTO) 97-103 MG    Recommendations:   [name]  is  a 81 y.o.  [place] male (does not speak [place], his daughters transcribe) with coronary artery disease status post PTCA to diagonal 2 for acute MI in 2005, hypertension, hyperlipidemia, diabetes mellitus who  angiography on 05/11/2017, showed mild proximal  RCA disease, moderate disease in the mid LAD and circumflex with negative FFR, patent D2 angioplasty site.  For worsening dyspnea, marked 3+ bilateral leg edema after returning from a trip to [place], suspected acute decompensated heart failure.  Since being on Entresto, Jardiance 10 mg daily which was started both for heart failure and uncontrolled diabetes mellitus, strict diet with salt reduction, he has had complete resolution of his leg edema, dyspnea has improved.  Reviewed his labs, serum creatinine has gradually worsened in view of aggressive diuresis.  We will change furosemide from 20 mg daily to as needed use for leg edema and worsening dyspnea, continue Jardiance 10 mg daily, could consider increasing it to 25 mg depending upon his A1c, discontinue amlodipine as his blood pressure is now well controlled and increase Entresto to moderate dose at 49/51 mg twice daily.  Strict instructions given to obtain BMP 10 days after starting higher dose to follow-up on serum creatinine.  He is scheduled for nuclear stress test, I will see him back in 4 weeks.    Bryan Prows, MD, [facility] 08/15/2021, 10:57 AM Office: [phone] Fax: [phone] Pager: [phone]

## 2021-08-16 LAB — BASIC METABOLIC PANEL
BUN/Creatinine Ratio: 9 — ABNORMAL LOW (ref 10–24)
BUN: 15 mg/dL (ref 8–27)
CO2: 21 mmol/L (ref 20–29)
Calcium: 9.3 mg/dL (ref 8.6–10.2)
Chloride: 105 mmol/L (ref 96–106)
Creatinine, Ser: 1.64 mg/dL — ABNORMAL HIGH (ref 0.76–1.27)
Glucose: 122 mg/dL — ABNORMAL HIGH (ref 70–99)
Potassium: 4.6 mmol/L (ref 3.5–5.2)
Sodium: 140 mmol/L (ref 134–144)
eGFR: 42 mL/min/{1.73_m2} — ABNORMAL LOW (ref >59)

## 2021-08-16 LAB — PRO B NATRIURETIC PEPTIDE: NT-Pro BNP: 205 pg/mL (ref 0–486)

## 2021-08-19 ENCOUNTER — Ambulatory Visit: Payer: Medicare Other | Admitting: Cardiology

## 2021-08-28 ENCOUNTER — Telehealth: Payer: Self-pay | Admitting: Cardiology

## 2021-08-28 NOTE — Telephone Encounter (Signed)
Patient's daughter called me and stated that rarely when he suddenly gets up feels dizzy, this morning he felt dizzy and felt like he was going to pass out, his daughter went and visited him and check the blood pressure and blood sugar everything seems to be okay and he is now asymptomatic and feels well.  Near syncopal spell related to probably orthostasis.  Advised them that if he ever has this episodes of dizziness not to walk but to sit down and lay down for few minutes.  Event happened in spite of patient feeling dizzy continued to walk towards the laundry.

## 2021-08-29 DIAGNOSIS — I700 Atherosclerosis of aorta: Secondary | ICD-10-CM | POA: Diagnosis not present

## 2021-08-29 DIAGNOSIS — Z0000 Encounter for general adult medical examination without abnormal findings: Secondary | ICD-10-CM | POA: Diagnosis not present

## 2021-08-29 DIAGNOSIS — E1129 Type 2 diabetes mellitus with other diabetic kidney complication: Secondary | ICD-10-CM | POA: Diagnosis not present

## 2021-08-29 DIAGNOSIS — C8590 Non-Hodgkin lymphoma, unspecified, unspecified site: Secondary | ICD-10-CM | POA: Diagnosis not present

## 2021-08-29 DIAGNOSIS — I509 Heart failure, unspecified: Secondary | ICD-10-CM | POA: Diagnosis not present

## 2021-08-29 DIAGNOSIS — N183 Chronic kidney disease, stage 3 unspecified: Secondary | ICD-10-CM | POA: Diagnosis not present

## 2021-08-29 DIAGNOSIS — K219 Gastro-esophageal reflux disease without esophagitis: Secondary | ICD-10-CM | POA: Diagnosis not present

## 2021-08-29 DIAGNOSIS — I209 Angina pectoris, unspecified: Secondary | ICD-10-CM | POA: Diagnosis not present

## 2021-08-29 DIAGNOSIS — I25119 Atherosclerotic heart disease of native coronary artery with unspecified angina pectoris: Secondary | ICD-10-CM | POA: Diagnosis not present

## 2021-08-29 DIAGNOSIS — I951 Orthostatic hypotension: Secondary | ICD-10-CM | POA: Diagnosis not present

## 2021-08-29 DIAGNOSIS — I129 Hypertensive chronic kidney disease with stage 1 through stage 4 chronic kidney disease, or unspecified chronic kidney disease: Secondary | ICD-10-CM | POA: Diagnosis not present

## 2021-08-29 DIAGNOSIS — E782 Mixed hyperlipidemia: Secondary | ICD-10-CM | POA: Diagnosis not present

## 2021-09-16 ENCOUNTER — Encounter: Payer: Self-pay | Admitting: Cardiology

## 2021-09-16 DIAGNOSIS — I5032 Chronic diastolic (congestive) heart failure: Secondary | ICD-10-CM

## 2021-09-16 NOTE — Telephone Encounter (Signed)
From pt

## 2021-09-17 MED ORDER — ENTRESTO 97-103 MG PO TABS: 1.0000 | ORAL_TABLET | Freq: Two times a day (BID) | ORAL | 3 refills | Status: DC

## 2021-09-17 NOTE — Telephone Encounter (Signed)
ICD-10-CM   1. Chronic diastolic heart failure (HCC)  I50.32 sacubitril-valsartan (ENTRESTO) 97-103 MG     Meds ordered this encounter  Medications   sacubitril-valsartan (ENTRESTO) 97-103 MG    Sig: Take 1 tablet by mouth 2 (two) times daily.    Dispense:  180 tablet    Refill:  3

## 2021-09-20 ENCOUNTER — Other Ambulatory Visit: Payer: Self-pay

## 2021-09-20 DIAGNOSIS — I5032 Chronic diastolic (congestive) heart failure: Secondary | ICD-10-CM

## 2021-09-20 MED ORDER — ENTRESTO 97-103 MG PO TABS: 1.0000 | ORAL_TABLET | Freq: Two times a day (BID) | ORAL | 3 refills | Status: DC

## 2021-09-27 ENCOUNTER — Encounter: Payer: Self-pay | Admitting: Cardiology

## 2021-09-27 DIAGNOSIS — I10 Essential (primary) hypertension: Secondary | ICD-10-CM

## 2021-09-27 MED ORDER — HYDRALAZINE HCL 25 MG PO TABS: 25.0000 mg | ORAL_TABLET | Freq: Three times a day (TID) | ORAL | 2 refills | Status: AC | PRN

## 2021-09-27 NOTE — Telephone Encounter (Signed)
From patient. Patient was scheduled to see Dr. Shellia Carwin, as your schedule is full.

## 2021-09-27 NOTE — Telephone Encounter (Signed)
ICD-10-CM   1. Essential hypertension  I10 hydrALAZINE (APRESOLINE) 25 MG tablet     Meds ordered this encounter  Medications   hydrALAZINE (APRESOLINE) 25 MG tablet    Sig: Take 1 tablet (25 mg total) by mouth 3 (three) times daily as needed (SBPP > 145 mm Hg).    Dispense:  90 tablet    Refill:  2

## 2021-09-29 ENCOUNTER — Ambulatory Visit: Payer: Medicare Other | Admitting: Internal Medicine

## 2021-09-29 IMAGING — CT CT HEAD W/O CM
4 series · 17 of 47 positions shown, 19 images · non-contrast
Comparison: October 05, 2009.

CLINICAL DATA: Headache.  Blurred vision.

EXAM:
CT HEAD WITHOUT CONTRAST
TECHNIQUE: Contiguous axial images were obtained from the base of the skull
through the vertex without intravenous contrast.

[Series 3: head without · axial · non-contrast · 0.43mm/px · z∈[-140,-10]mm · 7 of 36 slices shown, 9 images]
[im 5/36  brain]
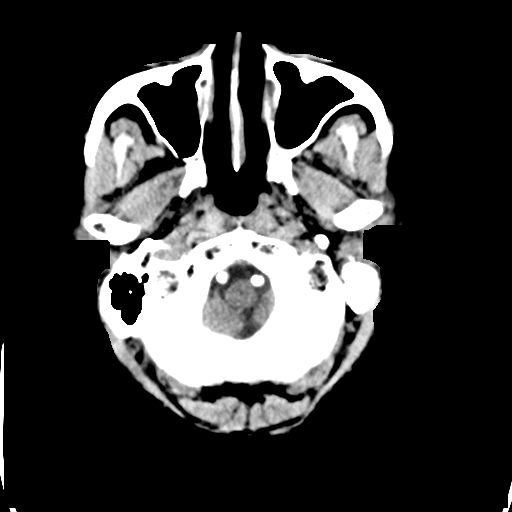
[im 5/36  bone]
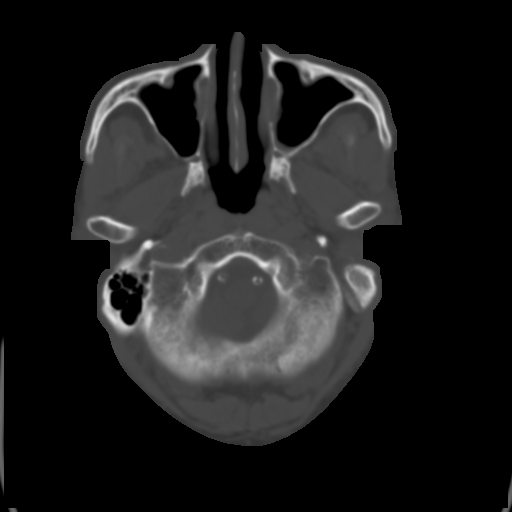
[im 9/36  brain]
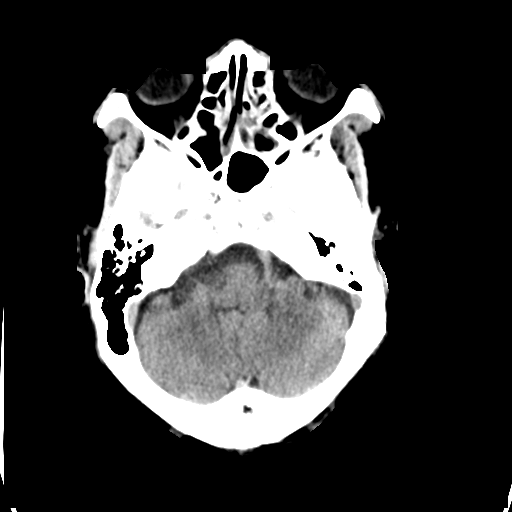
[im 14/36  brain]
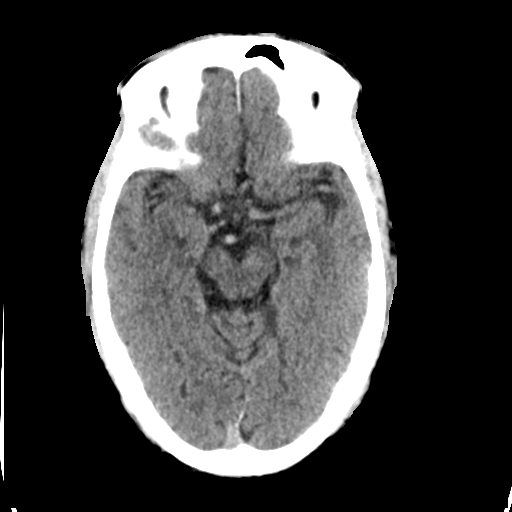
[im 18/36  brain]
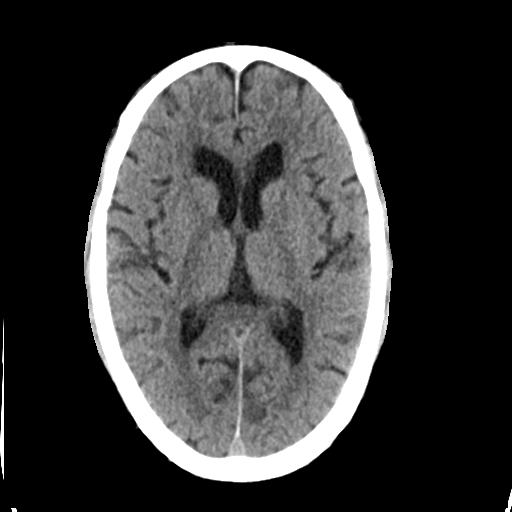
[im 22/36  brain]
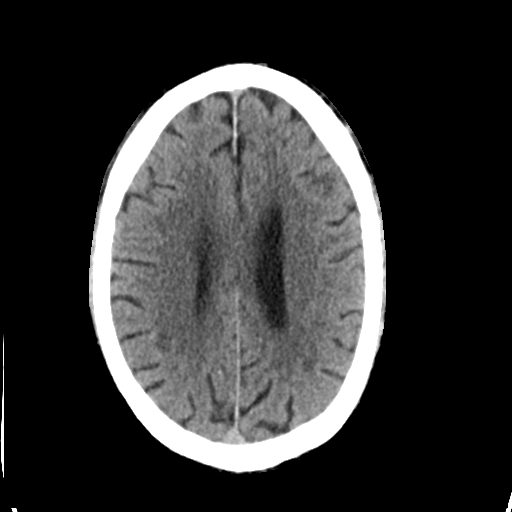
[im 22/36  bone]
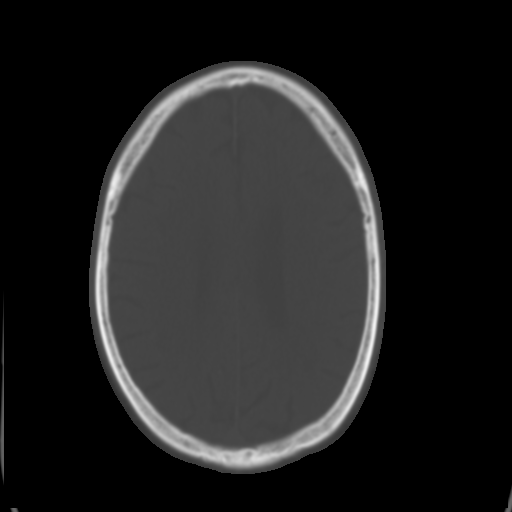
[im 27/36  brain]
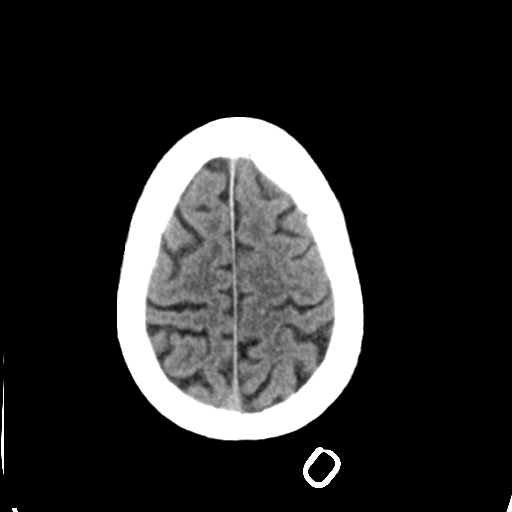
[im 31/36  brain]
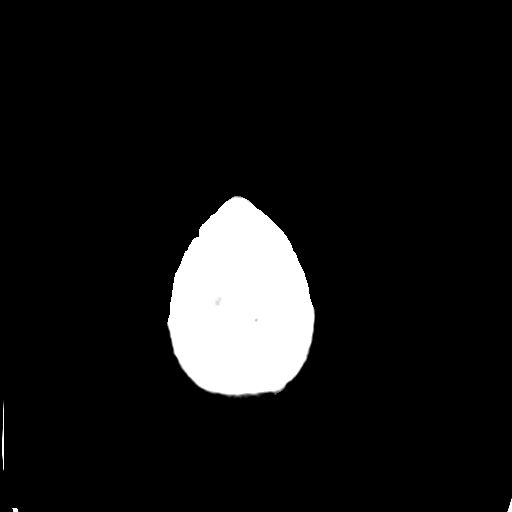

[Series 4: head bone · axial · 0.43mm/px · z∈[-144,-82]mm · 4 of 88 slices shown]
[im 9/88  bone]
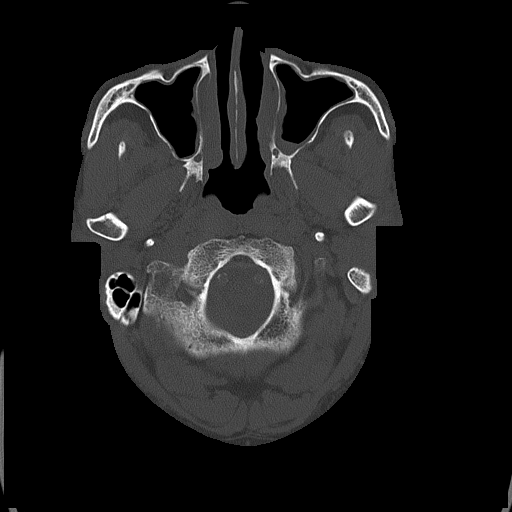
[im 18/88  bone]
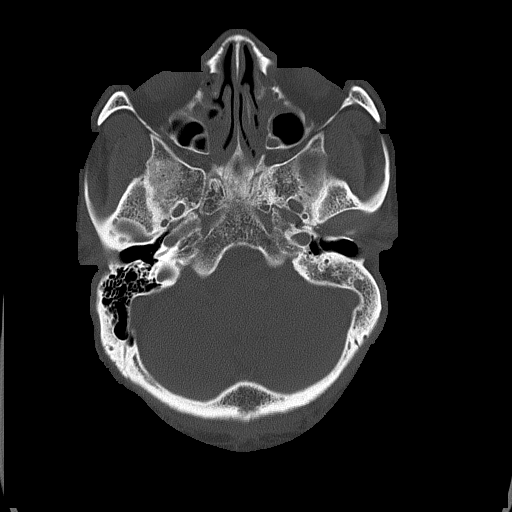
[im 27/88  bone]
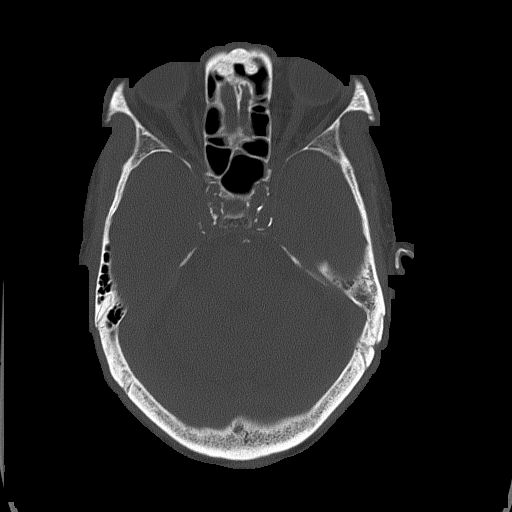
[im 40/88  bone]
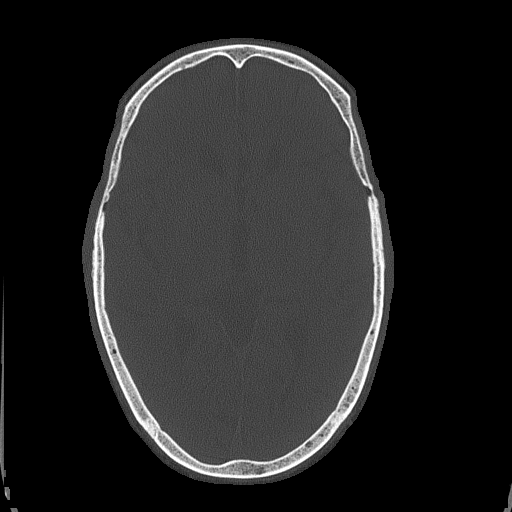

[Series 5: head without cor · coronal · non-contrast · 0.36mm/px · 3 of 74 slices shown]
[im 25/74  brain]
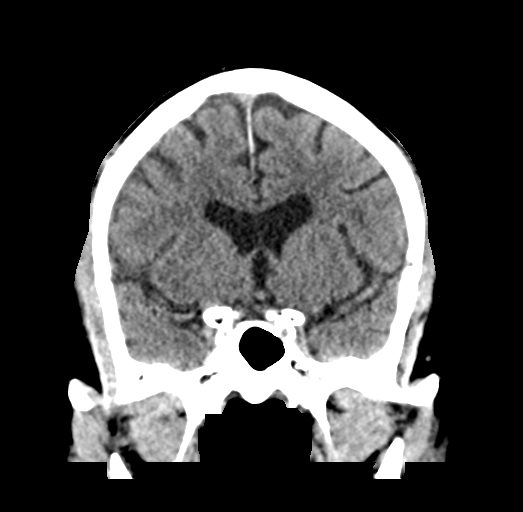
[im 33/74  brain]
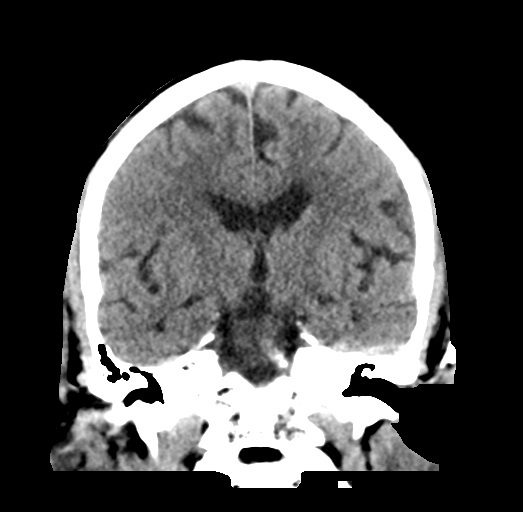
[im 41/74  brain]
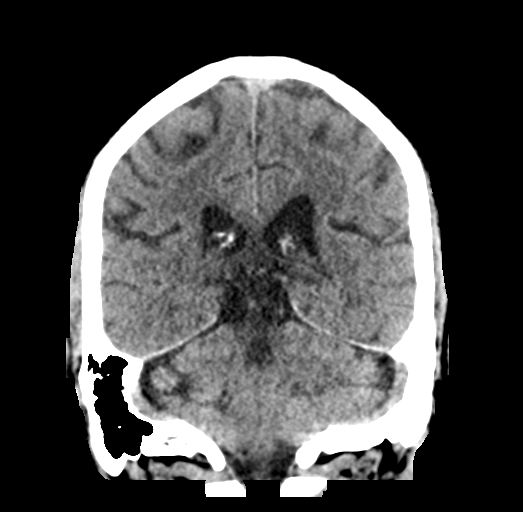

[Series 6: head without sag · sagittal · non-contrast · 0.39mm/px · 3 of 62 slices shown]
[im 21/62  brain]
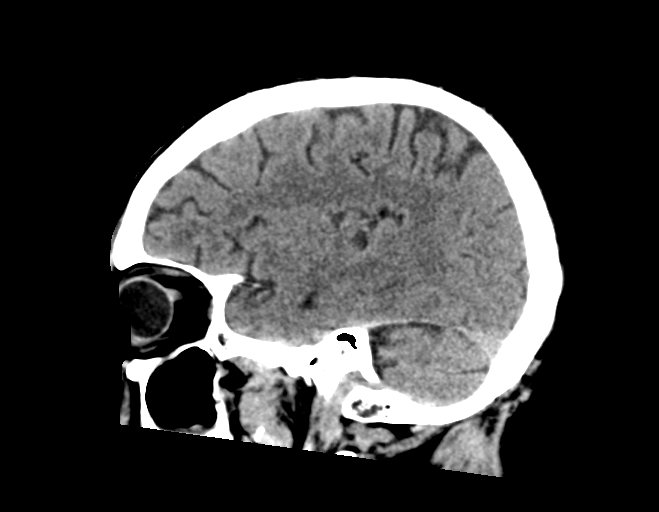
[im 31/62  brain]
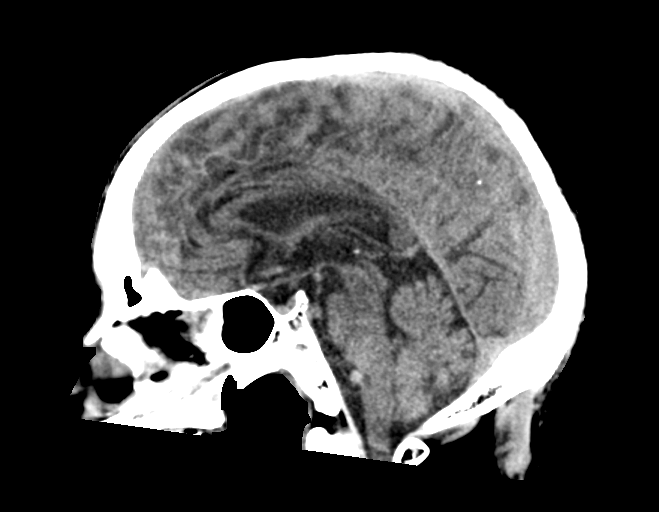
[im 41/62  brain]
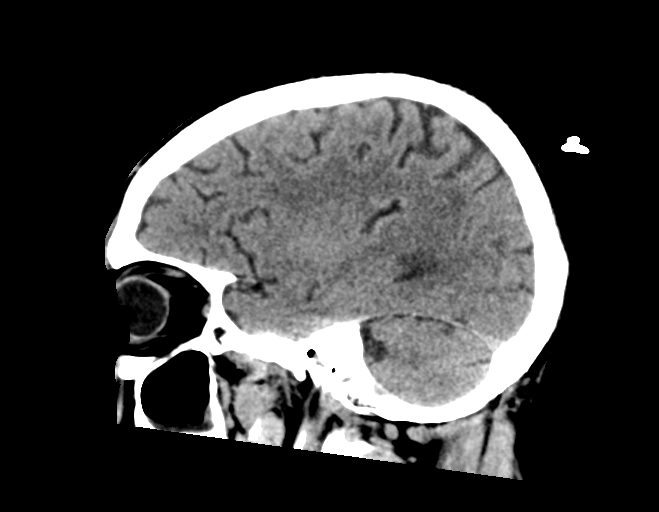

[17 of 47 positions shown; findings below may reference images not displayed]

FINDINGS: Brain: Mild chronic ischemic white matter disease is noted. No mass
effect or midline shift is noted. Ventricular size is within normal
limits. There is no evidence of mass lesion, hemorrhage or acute
infarction.

Vascular: No hyperdense vessel or unexpected calcification.

Skull: Normal. Negative for fracture or focal lesion.

Sinuses/Orbits: Bilateral ethmoid and maxillary sinusitis is noted.

Other: None.
IMPRESSION: No acute intracranial abnormality seen.

## 2021-09-29 IMAGING — CR DG CHEST 2V
2 series · 2 of 2 positions shown · non-contrast
Comparison: 07/16/2018

CLINICAL DATA: Shortness of breath.

EXAM:
CHEST - 2 VIEW

[chest lat]
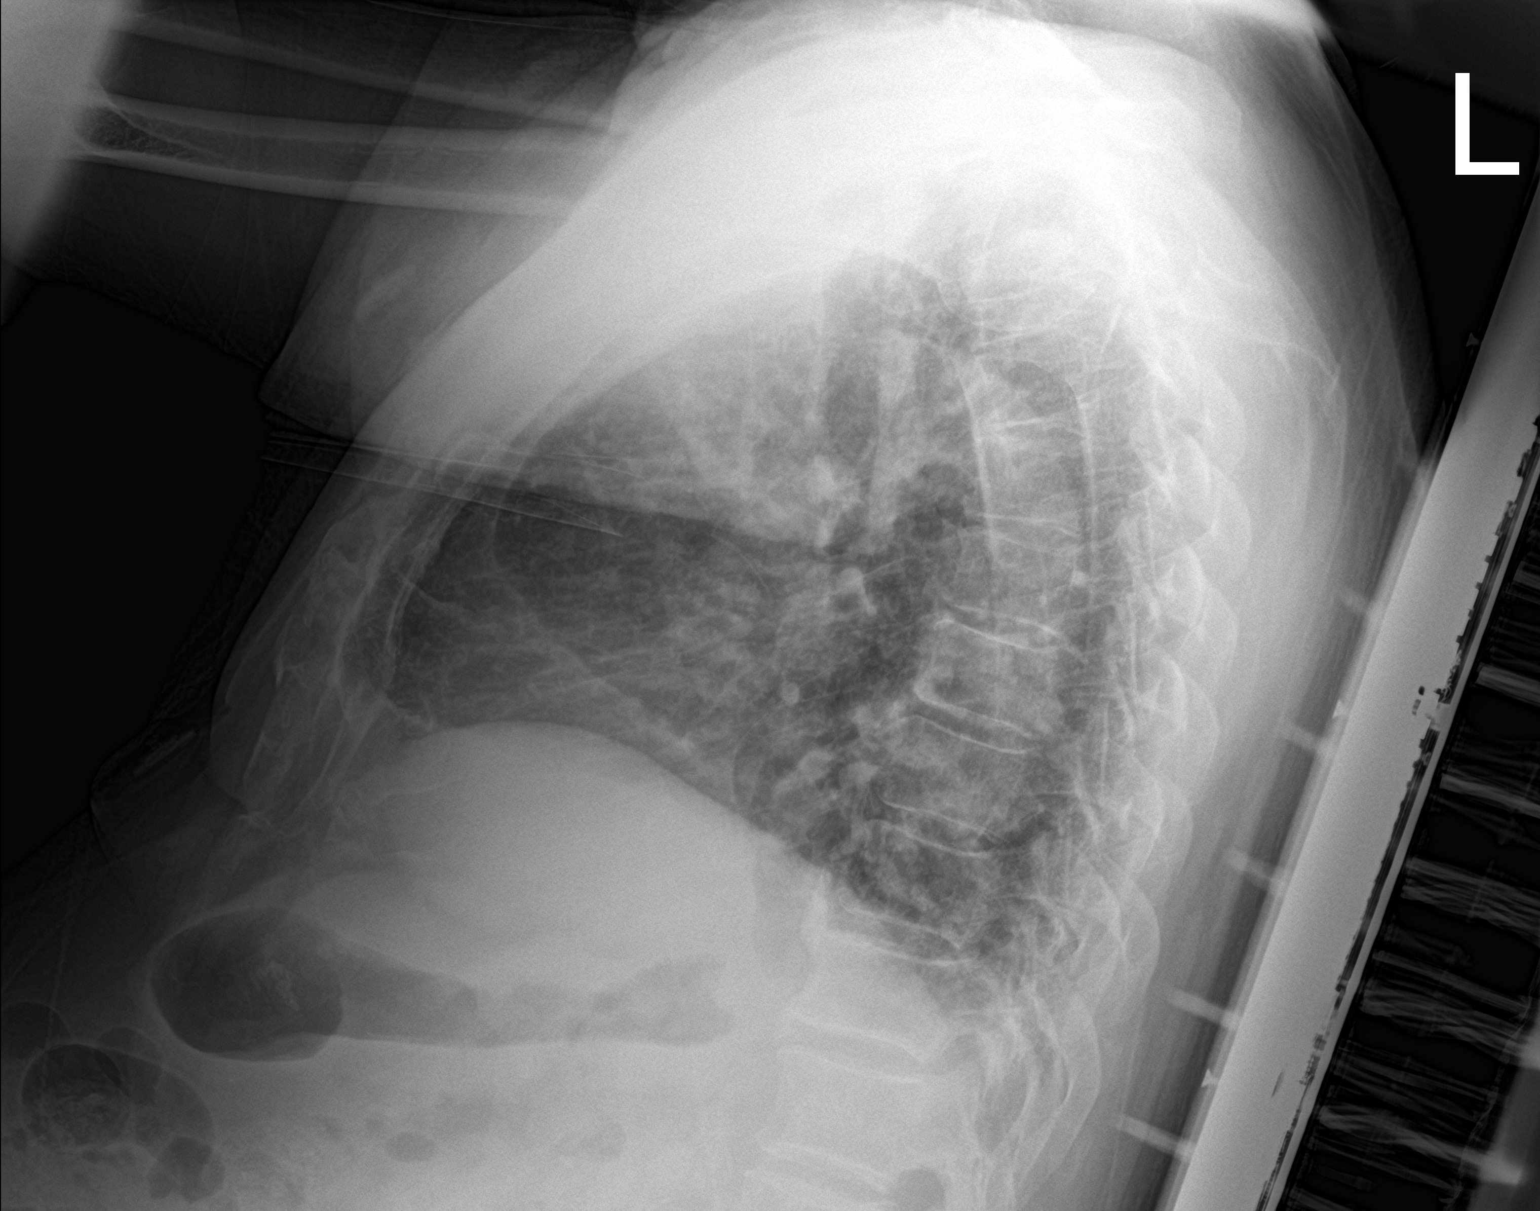

[chest ap]
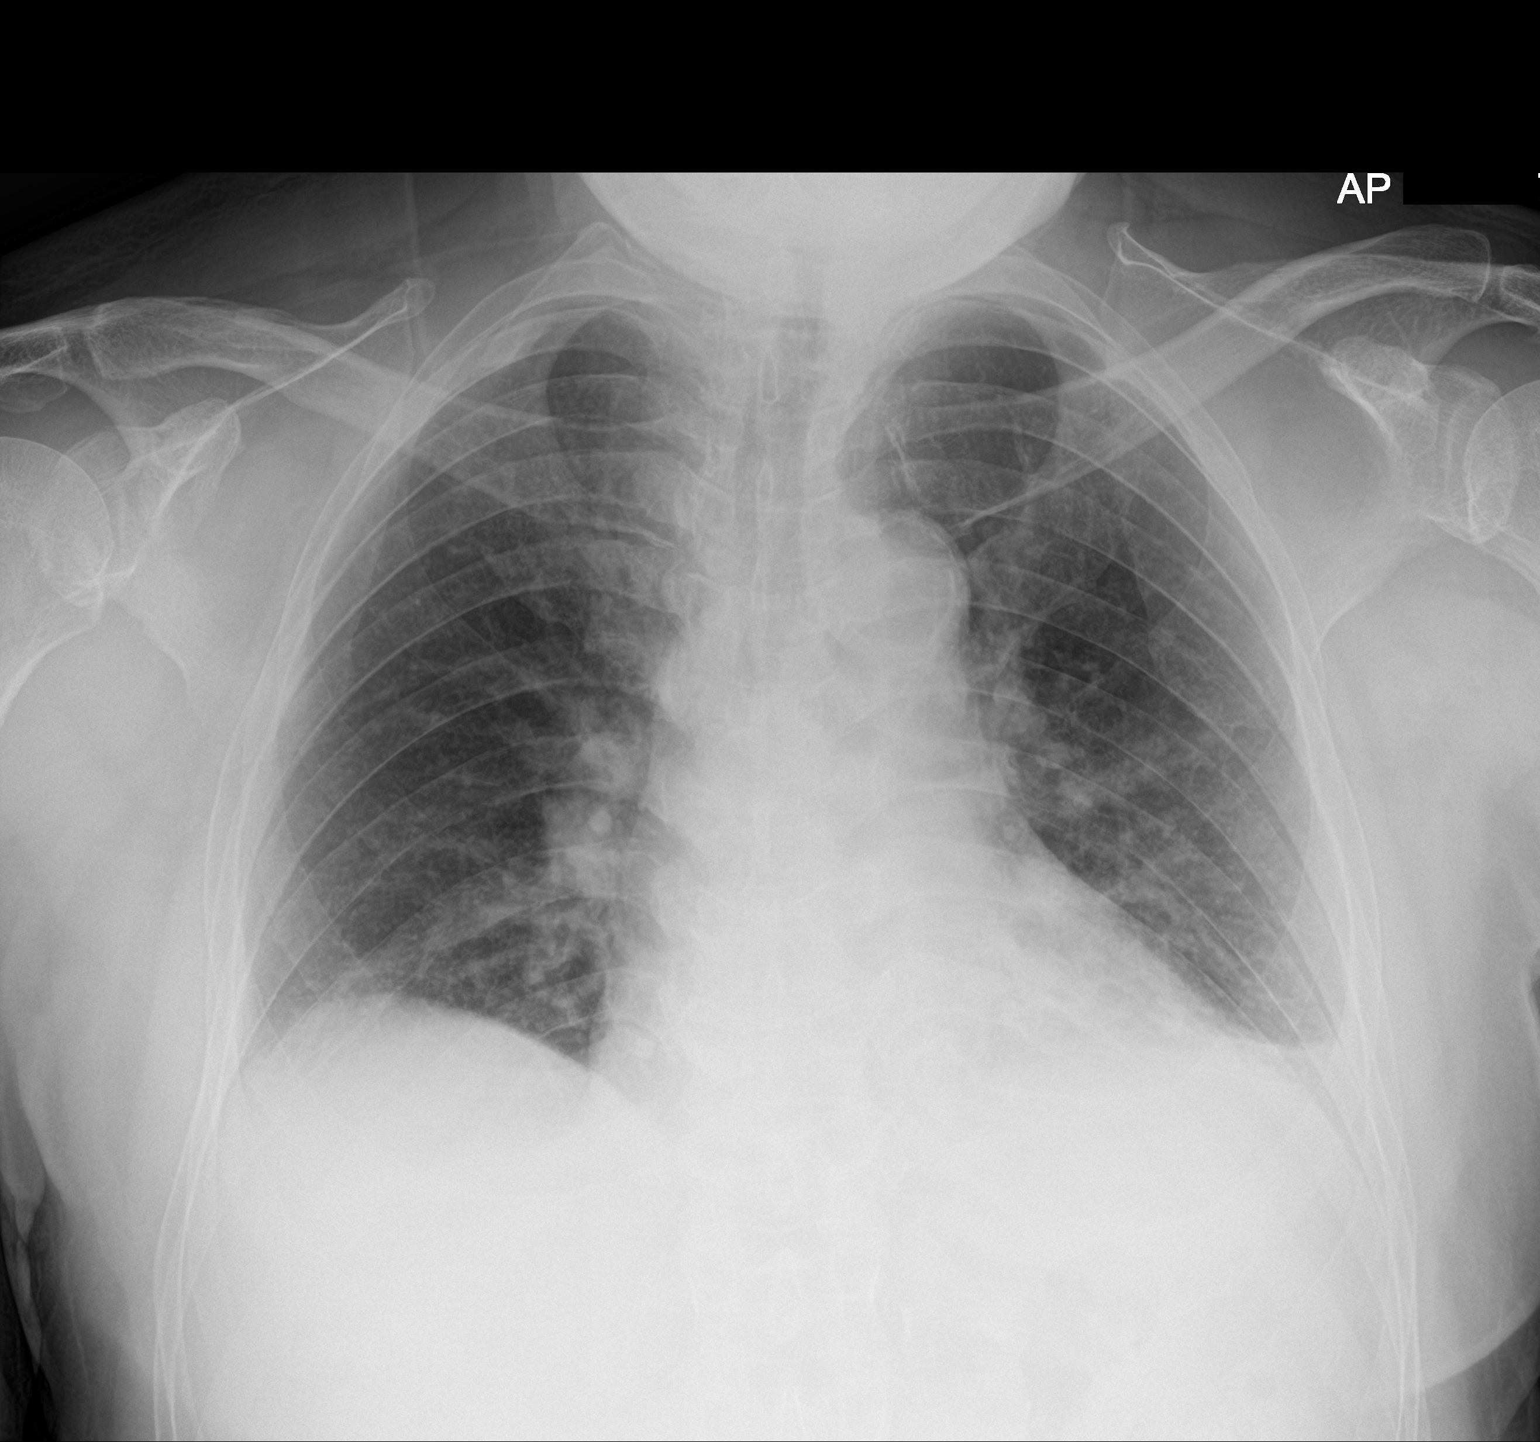

[2 of 2 positions shown; findings below may reference images not displayed]

FINDINGS: Patchy densities in the mid and lower left chest appear to be
chronic. Chronic blunting at the left costophrenic angle. Improved
aeration at the right lung base compared to the prior examination.
Heart size is within normal limits and stable. The trachea is
midline. No large pleural effusions.
IMPRESSION: Patchy densities in the mid and lower left chest are likely chronic.
Difficult to exclude areas of atelectasis or subtle infection.

## 2021-09-30 ENCOUNTER — Other Ambulatory Visit: Payer: Self-pay

## 2021-09-30 DIAGNOSIS — I5032 Chronic diastolic (congestive) heart failure: Secondary | ICD-10-CM

## 2021-09-30 MED ORDER — EMPAGLIFLOZIN 10 MG PO TABS: 10.0000 mg | ORAL_TABLET | Freq: Every day | ORAL | 2 refills | Status: DC

## 2021-10-06 ENCOUNTER — Ambulatory Visit: Payer: Self-pay

## 2021-10-17 ENCOUNTER — Other Ambulatory Visit: Payer: Self-pay | Admitting: Cardiology

## 2021-10-17 DIAGNOSIS — I5032 Chronic diastolic (congestive) heart failure: Secondary | ICD-10-CM

## 2021-10-31 ENCOUNTER — Other Ambulatory Visit: Payer: Self-pay

## 2021-10-31 DIAGNOSIS — I5032 Chronic diastolic (congestive) heart failure: Secondary | ICD-10-CM

## 2021-10-31 MED ORDER — ENTRESTO 97-103 MG PO TABS: 1.0000 | ORAL_TABLET | Freq: Two times a day (BID) | ORAL | 3 refills | Status: DC

## 2021-11-14 ENCOUNTER — Telehealth: Payer: Self-pay | Admitting: Cardiology

## 2021-11-14 NOTE — Telephone Encounter (Signed)
Has already been done.  

## 2021-11-14 NOTE — Telephone Encounter (Signed)
[facility] w/[facility] calling on behalf of patient. Says she faxed over the patient assistance form this morning, JG needs to fill out provider section. Then we can fax it to [employer] w/the prescription.

## 2021-11-18 DIAGNOSIS — N183 Chronic kidney disease, stage 3 unspecified: Secondary | ICD-10-CM | POA: Diagnosis not present

## 2021-11-18 DIAGNOSIS — E782 Mixed hyperlipidemia: Secondary | ICD-10-CM | POA: Diagnosis not present

## 2021-11-18 DIAGNOSIS — I2510 Atherosclerotic heart disease of native coronary artery without angina pectoris: Secondary | ICD-10-CM | POA: Diagnosis not present

## 2021-11-18 DIAGNOSIS — I209 Angina pectoris, unspecified: Secondary | ICD-10-CM | POA: Diagnosis not present

## 2021-11-18 DIAGNOSIS — I509 Heart failure, unspecified: Secondary | ICD-10-CM | POA: Diagnosis not present

## 2021-11-18 DIAGNOSIS — I25119 Atherosclerotic heart disease of native coronary artery with unspecified angina pectoris: Secondary | ICD-10-CM | POA: Diagnosis not present

## 2021-11-18 DIAGNOSIS — E1129 Type 2 diabetes mellitus with other diabetic kidney complication: Secondary | ICD-10-CM | POA: Diagnosis not present

## 2021-11-18 DIAGNOSIS — I129 Hypertensive chronic kidney disease with stage 1 through stage 4 chronic kidney disease, or unspecified chronic kidney disease: Secondary | ICD-10-CM | POA: Diagnosis not present

## 2021-11-18 DIAGNOSIS — K219 Gastro-esophageal reflux disease without esophagitis: Secondary | ICD-10-CM | POA: Diagnosis not present

## 2021-11-25 DIAGNOSIS — Z23 Encounter for immunization: Secondary | ICD-10-CM | POA: Diagnosis not present

## 2022-02-14 ENCOUNTER — Encounter: Payer: Self-pay | Admitting: Cardiology

## 2022-02-14 ENCOUNTER — Ambulatory Visit: Payer: Medicare Other | Admitting: Cardiology

## 2022-02-14 VITALS — BP 105/58 | HR 55 | Resp 16 | Ht 64.0 in | Wt 164.0 lb

## 2022-02-14 DIAGNOSIS — I25118 Atherosclerotic heart disease of native coronary artery with other forms of angina pectoris: Secondary | ICD-10-CM

## 2022-02-14 DIAGNOSIS — I10 Essential (primary) hypertension: Secondary | ICD-10-CM

## 2022-02-14 DIAGNOSIS — I5032 Chronic diastolic (congestive) heart failure: Secondary | ICD-10-CM

## 2022-02-14 DIAGNOSIS — N1832 Chronic kidney disease, stage 3b: Secondary | ICD-10-CM

## 2022-02-14 DIAGNOSIS — I129 Hypertensive chronic kidney disease with stage 1 through stage 4 chronic kidney disease, or unspecified chronic kidney disease: Secondary | ICD-10-CM | POA: Diagnosis not present

## 2022-02-14 NOTE — Patient Instructions (Addendum)
Hold Glimepiride for now until you see PCP.  His daughter has noticed

## 2022-02-14 NOTE — Progress Notes (Signed)
Primary Physician/Referring:  [name], MD  Patient ID: [name], male    DOB: Jan 27, 1941, 82 y.o.   MRN: [id number]  Chief Complaint  Patient presents with   Coronary Artery Disease   Congestive Heart Failure   Follow-up    6 month   HPI:    [name]  is a 82 y.o. [place] male (does not speak English, his daughters transcribe) with coronary artery disease status post PTCA to diagonal 2 for acute MI in 2005, hypertension, hyperlipidemia, diabetes mellitus with stage IIIa chronic kidney disease, chronic diastolic heart failure presents here for 82-monthoffice visit.  Daughter is present.  Is presently doing well, has occasional episodes of dizziness and his daughter has noticed low blood pressure.  No syncope.  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  Social History   Tobacco Use   Smoking status: Former    Packs/day: 0.25    Years: 20.00    Total pack years: 5.00    Types: Cigarettes    Quit date: 12/28/1994    Years since quitting: 27.1   Smokeless tobacco: Never  Substance Use Topics   Alcohol use: No  Marital Status: Married    ROS  Review of Systems  Cardiovascular:  Negative for chest pain, dyspnea on exertion and leg swelling.   Objective  Blood pressure (!) 105/58, pulse (!) 55, resp. rate 16, height '5\' 4"'$  (1.626 m), weight 164 lb (74.4 kg), SpO2 96 %.     02/14/2022    3:06 PM 08/15/2021    9:59 AM 07/20/2021   10:52 AM  Vitals with BMI  Height '5\' 4"'$  '5\' 4"'$  '5\' 4"'$   Weight 164 lbs 171 lbs 6 oz 172 lbs 10 oz  BMI  28.14 [id number] Systolic [id number] Diastolic 58 68 63  Pulse 55 71 59    Physical Exam Neck:     Vascular: No carotid bruit or JVD.  Cardiovascular:     Rate and Rhythm: Normal rate and regular rhythm.     Pulses: Intact distal pulses.     Heart sounds: Normal heart sounds. No murmur heard.    No gallop.  Pulmonary:     Effort: Pulmonary effort is normal.     Breath sounds: Normal breath sounds.  Abdominal:     General: Bowel sounds are normal.     Palpations: Abdomen is soft.  Musculoskeletal:     Right lower leg: No tenderness. No edema.     Left lower leg: No tenderness. No edema.    Laboratory examination:     Chemistry    Lab Results  Component Value Date   NA 140 08/15/2021   K 4.6 08/15/2021   CO2 21 08/15/2021   BUN 15 08/15/2021   CREATININE 1.64 (H) 08/15/2021   CALCIUM 9.3 08/15/2021   GLUCOSE 122 (H) 08/15/2021      Latest Ref Rng & Units 08/15/2021   11:30 AM 07/18/2021  12:59 PM 07/07/2021    4:18 PM  CMP  Glucose 70 - 99 mg/dL 122  241  148   BUN 8 - 27 mg/dL '15  29  17   '$ Creatinine 0.76 - 1.27 mg/dL 1.64  1.75  1.51   Sodium 134 - 144 mmol/L 140  139  141   Potassium 3.5 - 5.2 mmol/L 4.6  4.3  4.6   Chloride 96 - 106 mmol/L 105  102  102   CO2 20 - 29 mmol/L '21  21  24   '$ Calcium 8.6 - 10.2 mg/dL 9.3  9.4  9.5    TSH Recent Labs    04/21/21 0926  TSH 2.890    ProBNP (last 3 results) Recent Labs    07/07/21 1618 07/18/21 1258 08/15/21 1134  PROBNP 250 84 205   External labs:  Cholesterol, total 169.000 m 08/29/2021 HDL 41.000 mg 08/29/2021 LDL 87.000 mg 08/29/2021 Triglycerides 246.000 m 08/29/2021  A1C 7.100 % 08/29/2021  Hemoglobin 12.300 g/d 06/25/2020 Platelets 154.000 X1 06/25/2020  Creatinine, Serum 1.800 mg/ 08/29/2021 Potassium 4.800 mm 08/29/2021 ALT (SGPT) 24.000 U/L 08/29/2021  Medications and allergies   Allergies  Allergen Reactions   Ace Inhibitors Cough   Lipitor [Atorvastatin Calcium] Other (See Comments)     Muscle aches   Simvastatin Other (See Comments)    Muscle aches   Advicor [Niacin-Lovastatin Er] Rash     Current Outpatient Medications:    albuterol (VENTOLIN HFA) 108 (90 Base) MCG/ACT inhaler, Inhale 1 puff into the lungs as needed., Disp: , Rfl:    aspirin 81 MG chewable tablet, Chew 1 tablet (81 mg total) by mouth daily., Disp: 60 tablet, Rfl: 6   colchicine 0.6 MG tablet, Take 0.6 mg by mouth continuous as needed. Take 0.6 mg by mouth every eight hours until acute gout flare subsides, Disp: , Rfl: 0   cyclobenzaprine (FLEXERIL) 5 MG tablet, Take 5 mg by mouth 2 (two) times daily as needed., Disp: , Rfl:    famotidine (PEPCID) 40 MG tablet, Take 40 mg by mouth daily., Disp: , Rfl:    glimepiride (AMARYL) 1 MG tablet, Take 1 tablet by mouth daily. On hold, Disp: , Rfl:    hydrALAZINE (APRESOLINE) 25 MG tablet, Take 1 tablet (25 mg total) by mouth 3 (three) times daily as needed (SBPP > 145 mm Hg). (Patient taking differently: Take 25 mg by mouth 3 (three) times daily as needed (SBP > 145 mm Hg).), Disp: 90 tablet, Rfl: 2   JARDIANCE 10 MG TABS tablet, TAKE ONE TABLET BY MOUTH BEFORE breakfast, Disp: 30 tablet, Rfl: 2   labetalol (NORMODYNE) 200 MG tablet, TAKE ONE TABLET BY MOUTH AT BREAKFAST AND AT BEDTIME (Patient taking differently: Take 100 mg by mouth 2 (two) times daily.), Disp: 180 tablet, Rfl: 2   LINZESS 72 MCG capsule, Take 72 mcg by mouth every morning., Disp: , Rfl:    nitroGLYCERIN (NITROSTAT) 0.4 MG SL tablet, Place 1 tablet (0.4 mg total) under the tongue every 5 (five) minutes as needed for chest pain., Disp: 25 tablet, Rfl: 2   ondansetron (ZOFRAN) 8 MG tablet, Take 8 mg by mouth every 8 (eight) hours as needed for nausea or vomiting. , Disp: , Rfl:    pantoprazole (PROTONIX) 40 MG tablet, Take 40 mg by mouth daily., Disp: , Rfl:    polyethylene glycol (MIRALAX / GLYCOLAX) 17 g packet, 1 packet mixed with 8 ounces of fluid, Disp: , Rfl:    rosuvastatin (CRESTOR)  20 MG  tablet, Take 20 mg by mouth daily. , Disp: , Rfl:    sacubitril-valsartan (ENTRESTO) 97-103 MG, Take 1 tablet by mouth 2 (two) times daily., Disp: 180 tablet, Rfl: 3   vitamin C (VITAMIN C) 500 MG tablet, Take 1 tablet (500 mg total) by mouth 2 (two) times daily., Disp: 60 tablet, Rfl: 0   Promethazine-Codeine 6.25-10 MG/5ML SOLN, Take 5 mLs by mouth 2 (two) times daily as needed. (Patient not taking: Reported on 02/14/2022), Disp: 180 mL, Rfl: 0     Radiology:   Chest X-Ray 01/07/2014: Normal chest x-ray.  Mild cardiomegaly.  No active cardiopulmonary disease.  Cardiac Studies:   Coronary angiogram 05/11/2017: Moderate nonobstructive coronary artery disease. No true left main. Ostial 50%, mid 30-40% stenoses. FFR 0.83. Medium sized diagonal with diffuse 60% disease Ostial LCx 40% with resting Pd/Pa 0.98. Mid 20-30% disease Prox RCA 40% stenosis. Normal LVEDP, LVEF 55-65%.  PCV MYOCARDIAL PERFUSION WO LEXISCAN 03/24/2020 Normal ECG stress. The patient exercised for 5 minutes and 18 seconds of a Bruce protocol, achieving approximately 7.05 METs.  Stress terminated due to dyspnea. Resting EKG normal sinus rhythm, stress EKG no ST-T wave changes with stress testing.  Frequent PVCs were evident through the exercise and in recovery. Myocardial perfusion is abnormal. There is a moderate-sized scar noted in the anterior and anterolateral and apical lateral region with moderate peri-infarct ischemia. Gated SPECT imaging of the left ventricle was abnormal, demonstrating akinesis of the apical anterior wall, apical lateral wall and mid anterior wall.  Stress LV EF is moderately dysfunctional 37%. Compared to 10/18/2012, moderate peri-infarct ischemia is new.  Previously scar was noted in similar region.  Intermediate risk.  PCV ECHOCARDIOGRAM COMPLETE 06/29/2021  Left ventricle cavity is normal in size. Moderate concentric hypertrophy of the left ventricle. Normal global wall motion. Normal LV systolic  function with EF 71%. Doppler evidence of grade I (impaired) diastolic dysfunction, normal LAP. Trileaflet aortic valve. Mild aortic valve leaflet calcification. Trace aortic regurgitation. Moderate tricuspid regurgitation. Mild pulmonic regurgitation. No evidence of pulmonary hypertension. Compared to previous study on 02/12/2020, mod TR is new.   EKG   EKG 02/14/2022: Normal sinus rhythm at rate of 60 bpm, left axis deviation, left anterior fascicular block.  Poor R wave progression, probably normal variant.  Frequent PVCs (3).  Compared to 07/07/2021, PVCs new.   EKG 04/21/2021: Normal sinus rhythm at rate of 72 bpm, left atrial enlargement, left axis deviation, left anterior fascicular block.  LVH.  Nonspecific T wave flattening.  No significant change from 02/04/2022.  Assessment     ICD-10-CM   1. Coronary artery disease of native artery of native heart with stable angina pectoris ([place])  I25.118 EKG 12-Lead    2. Essential hypertension  I10     3. Stage 3b chronic kidney disease (HCC)  N18.32     4. Chronic diastolic heart failure (HCC)  I50.32       No orders of the defined types were placed in this encounter.   Medications Discontinued During This Encounter  Medication Reason   simethicone (MYLICON) 80 MG chewable tablet Patient Preference   isosorbide mononitrate (IMDUR) 60 MG 24 hr tablet Discontinued by provider   Recommendations:   [name]  is a 82 y.o.   [place] male (does not speak English, his daughters transcribe) with coronary artery disease status post PTCA to diagonal 2 for acute MI in 2005, hypertension, hyperlipidemia, diabetes mellitus with stage IIIa chronic kidney disease, chronic diastolic  heart failure presents here for 74-monthoffice visit.  Daughter is present.  1. Coronary artery disease of native artery of native heart with stable angina pectoris ([facility] Nuclear stress test on 03/24/2020 I reviewed moderate-sized anterior anterolateral and apical  lateral scar with mild peri-infarct ischemia, intermediate restudy with moderately reduced LVEF.  However compared to 2014 stress test, peri-infarct ischemia was new but scar was noted.  Given his stable cardiac status, medical therapy was recommended with goals of performing cardiac catheterization if you are develop any acute decompensated heart failure.  He has remained stable over the past 6 months.  2. Essential hypertension His blood pressure to be very soft.  I have discontinued isosorbide mononitrate.  Advised him if he continues to have dizziness or low blood pressure, we could certainly cut his Entresto in half twice daily.  He is on maximum dose of Entresto.  Overall I am extremely pleased with his progress, no clinical evidence of heart failure, diabetes is also improved since being on Jardiance.  3. Stage 3b chronic kidney disease ([place] Renal function has remained stable.  4. Chronic diastolic heart failure ([place] Since being on Entresto, Jardiance 10 mg daily which was started both for heart failure and uncontrolled diabetes mellitus, strict diet with salt reduction, he has had stable course without recurrence of acute decompensated heart failure.  I would like to see him back in 3 months for follow-up.  Since he has been on Jardiance, blood sugars also improved, as he is losing weight and blood pressure is improved and in view of stage III chronic kidney disease, advised him to hold off on glimepiride and have an appointment to see his PCP soon and can further discuss if he needs to continue this.   JAdrian Prows MD, [facility]1/03/2022, 3:39 PM Office: [phone]: [phone]: [phone]

## 2022-02-15 ENCOUNTER — Ambulatory Visit: Payer: Medicare Other | Admitting: Cardiology

## 2022-02-16 ENCOUNTER — Other Ambulatory Visit: Payer: Self-pay

## 2022-02-16 DIAGNOSIS — I5032 Chronic diastolic (congestive) heart failure: Secondary | ICD-10-CM

## 2022-02-16 MED ORDER — ENTRESTO 97-103 MG PO TABS: 1.0000 | ORAL_TABLET | Freq: Two times a day (BID) | ORAL | 3 refills | Status: DC

## 2022-02-24 ENCOUNTER — Other Ambulatory Visit: Payer: Self-pay

## 2022-02-24 DIAGNOSIS — I5032 Chronic diastolic (congestive) heart failure: Secondary | ICD-10-CM

## 2022-02-24 MED ORDER — EMPAGLIFLOZIN 10 MG PO TABS: ORAL_TABLET | ORAL | 3 refills | Status: DC

## 2022-03-01 ENCOUNTER — Other Ambulatory Visit: Payer: Self-pay

## 2022-03-01 DIAGNOSIS — I5032 Chronic diastolic (congestive) heart failure: Secondary | ICD-10-CM

## 2022-03-01 MED ORDER — EMPAGLIFLOZIN 10 MG PO TABS: ORAL_TABLET | ORAL | 3 refills | Status: DC

## 2022-03-03 DIAGNOSIS — E1122 Type 2 diabetes mellitus with diabetic chronic kidney disease: Secondary | ICD-10-CM | POA: Diagnosis not present

## 2022-03-03 DIAGNOSIS — N183 Chronic kidney disease, stage 3 unspecified: Secondary | ICD-10-CM | POA: Diagnosis not present

## 2022-03-03 DIAGNOSIS — E782 Mixed hyperlipidemia: Secondary | ICD-10-CM | POA: Diagnosis not present

## 2022-03-03 DIAGNOSIS — I130 Hypertensive heart and chronic kidney disease with heart failure and stage 1 through stage 4 chronic kidney disease, or unspecified chronic kidney disease: Secondary | ICD-10-CM | POA: Diagnosis not present

## 2022-03-03 DIAGNOSIS — E1129 Type 2 diabetes mellitus with other diabetic kidney complication: Secondary | ICD-10-CM | POA: Diagnosis not present

## 2022-03-03 DIAGNOSIS — K219 Gastro-esophageal reflux disease without esophagitis: Secondary | ICD-10-CM | POA: Diagnosis not present

## 2022-03-03 DIAGNOSIS — I209 Angina pectoris, unspecified: Secondary | ICD-10-CM | POA: Diagnosis not present

## 2022-03-03 DIAGNOSIS — I25119 Atherosclerotic heart disease of native coronary artery with unspecified angina pectoris: Secondary | ICD-10-CM | POA: Diagnosis not present

## 2022-03-03 DIAGNOSIS — Z8572 Personal history of non-Hodgkin lymphomas: Secondary | ICD-10-CM | POA: Diagnosis not present

## 2022-03-03 DIAGNOSIS — I509 Heart failure, unspecified: Secondary | ICD-10-CM | POA: Diagnosis not present

## 2022-03-03 DIAGNOSIS — I129 Hypertensive chronic kidney disease with stage 1 through stage 4 chronic kidney disease, or unspecified chronic kidney disease: Secondary | ICD-10-CM | POA: Diagnosis not present

## 2022-03-03 DIAGNOSIS — I700 Atherosclerosis of aorta: Secondary | ICD-10-CM | POA: Diagnosis not present

## 2022-03-09 NOTE — Progress Notes (Signed)
Labs 03/03/2022:  A1c 7.4%.  Sodium 140, potassium 4.3, calcium 10.1, LFTs normal, BUN 30, creatinine 1.38, EGFR 51 mL.  Total cholesterol 190, triglycerides 131, HDL 46, LDL 114.

## 2022-05-16 ENCOUNTER — Ambulatory Visit: Payer: Medicare Other | Admitting: Cardiology

## 2022-05-23 ENCOUNTER — Encounter: Payer: Self-pay | Admitting: Cardiology

## 2022-05-23 ENCOUNTER — Ambulatory Visit: Payer: Medicare Other | Admitting: Cardiology

## 2022-05-23 VITALS — BP 110/60 | HR 55 | Resp 16 | Ht 64.0 in | Wt 157.0 lb

## 2022-05-23 DIAGNOSIS — N1831 Chronic kidney disease, stage 3a: Secondary | ICD-10-CM | POA: Diagnosis not present

## 2022-05-23 DIAGNOSIS — I5032 Chronic diastolic (congestive) heart failure: Secondary | ICD-10-CM | POA: Diagnosis not present

## 2022-05-23 DIAGNOSIS — I25118 Atherosclerotic heart disease of native coronary artery with other forms of angina pectoris: Secondary | ICD-10-CM

## 2022-05-23 DIAGNOSIS — Z794 Long term (current) use of insulin: Secondary | ICD-10-CM | POA: Diagnosis not present

## 2022-05-23 DIAGNOSIS — E1122 Type 2 diabetes mellitus with diabetic chronic kidney disease: Secondary | ICD-10-CM | POA: Diagnosis not present

## 2022-05-23 MED ORDER — ENTRESTO 49-51 MG PO TABS: 1.0000 | ORAL_TABLET | Freq: Two times a day (BID) | ORAL | 3 refills | Status: DC

## 2022-05-23 MED ORDER — EMPAGLIFLOZIN 25 MG PO TABS: 25.0000 mg | ORAL_TABLET | Freq: Every day | ORAL | 3 refills | Status: DC

## 2022-05-23 NOTE — Progress Notes (Signed)
Primary Physician/Referring:  Blair Heys, MD  Patient ID: [name], male    DOB: Sep 25, 1940, 82 y.o.   MRN: [id number]  No chief complaint on file.  HPI:    [name]  is a 82 y.o. 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  Daughter is present.  This is a 57-month office visit.  He is presently doing well, has occasional episodes of dizziness and his daughter has noticed low blood pressure, this morning after patient took a shower, his systolic blood pressure was 80 mmHg.  No syncope.  Otherwise patient and his daughter state that he is feeling the best he has in quite a while.  He has not had any further shortness of breath or leg edema.  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  Social History   Tobacco Use   Smoking status: Former    Packs/day: 0.25    Years: 20.00    Additional pack years: 0.00    Total pack years: 5.00    Types: Cigarettes    Quit date: 12/28/1994    Years since quitting: 27.4   Smokeless tobacco: Never  Substance Use Topics   Alcohol use: No  Marital Status: Married    ROS  Review of Systems  Cardiovascular:  Negative for chest pain, dyspnea on exertion and leg swelling.   Objective  Blood pressure 110/60, pulse (!) 55, resp. rate 16, height 5\' 4"  (1.626 m),  weight 157 lb (71.2 kg), SpO2 95 %.     05/23/2022    2:12 PM 02/14/2022    3:06 PM 08/15/2021    9:59 AM  Vitals with BMI  Height 5\' 4"  5\' 4"  5\' 4"   Weight 157 lbs 164 lbs 171 lbs 6 oz  BMI 26.94 28.14 29.41  Systolic 110 105 782  Diastolic 60 58 68  Pulse 55 55 71    Physical Exam Neck:     Vascular: No carotid bruit or JVD.  Cardiovascular:     Rate and Rhythm: Normal rate and regular rhythm.     Pulses: Intact distal pulses.     Heart sounds: Normal heart sounds. No murmur heard.    No gallop.  Pulmonary:     Effort: Pulmonary effort is normal.     Breath sounds: Normal breath sounds.  Abdominal:     General: Bowel sounds are normal.     Palpations: Abdomen is soft.  Musculoskeletal:     Right lower leg: No tenderness. No edema.     Left lower leg: No tenderness. No edema.    Laboratory examination:     Chemistry    Lab Results  Component Value Date   NA 140 08/15/2021   K 4.6 08/15/2021   CO2 21 08/15/2021   BUN  15 08/15/2021   CREATININE 1.64 (H) 08/15/2021   CALCIUM 9.3 08/15/2021   GLUCOSE 122 (H) 08/15/2021      Latest Ref Rng & Units 08/15/2021   11:30 AM 07/18/2021   12:59 PM 07/07/2021    4:18 PM  CMP  Glucose 70 - 99 mg/dL 161122  096241  045148   BUN 8 - 27 mg/dL 15  29  17    Creatinine 0.76 - 1.27 mg/dL 4.091.64  8.111.75  9.141.51   Sodium 134 - 144 mmol/L 140  139  141   Potassium 3.5 - 5.2 mmol/L 4.6  4.3  4.6   Chloride 96 - 106 mmol/L 105  102  102   CO2 20 - 29 mmol/L 21  21  24    Calcium 8.6 - 10.2 mg/dL 9.3  9.4  9.5    TSH No results for input(s): "TSH" in the last 8760 hours.   ProBNP (last 3 results) Recent Labs    07/07/21 1618 07/18/21 1258 08/15/21 1134  PROBNP 250 84 205   External labs:  Cholesterol, total 169.000 m 08/29/2021 HDL 41.000 mg 08/29/2021 LDL 87.000 mg 08/29/2021 Triglycerides 246.000 m 08/29/2021  A1C 7.100 % 08/29/2021  Hemoglobin 12.300 g/d 06/25/2020 Platelets 154.000 X1 06/25/2020  Creatinine, Serum 1.800 mg/  08/29/2021 Potassium 4.800 mm 08/29/2021 ALT (SGPT) 24.000 U/L 08/29/2021  Medications and allergies   Allergies  Allergen Reactions   Ace Inhibitors Cough   Lipitor [Atorvastatin Calcium] Other (See Comments)    Muscle aches   Simvastatin Other (See Comments)    Muscle aches   Advicor [Niacin-Lovastatin Er] Rash     Current Outpatient Medications:    albuterol (VENTOLIN HFA) 108 (90 Base) MCG/ACT inhaler, Inhale 1 puff into the lungs as needed., Disp: , Rfl:    aspirin 81 MG chewable tablet, Chew 1 tablet (81 mg total) by mouth daily., Disp: 60 tablet, Rfl: 6   colchicine 0.6 MG tablet, Take 0.6 mg by mouth continuous as needed. Take 0.6 mg by mouth every eight hours until acute gout flare subsides, Disp: , Rfl: 0   cyclobenzaprine (FLEXERIL) 5 MG tablet, Take 5 mg by mouth 2 (two) times daily as needed., Disp: , Rfl:    empagliflozin (JARDIANCE) 25 MG TABS tablet, Take 1 tablet (25 mg total) by mouth daily before breakfast., Disp: 90 tablet, Rfl: 3   hydrALAZINE (APRESOLINE) 25 MG tablet, Take 1 tablet (25 mg total) by mouth 3 (three) times daily as needed (SBPP > 145 mm Hg). (Patient taking differently: Take 25 mg by mouth 3 (three) times daily as needed (SBP > 145 mm Hg).), Disp: 90 tablet, Rfl: 2   labetalol (NORMODYNE) 200 MG tablet, TAKE ONE TABLET BY MOUTH AT BREAKFAST AND AT BEDTIME (Patient taking differently: Take 100 mg by mouth 2 (two) times daily.), Disp: 180 tablet, Rfl: 2   LINZESS 72 MCG capsule, Take 72 mcg by mouth every morning., Disp: , Rfl:    nitroGLYCERIN (NITROSTAT) 0.4 MG SL tablet, Place 1 tablet (0.4 mg total) under the tongue every 5 (five) minutes as needed for chest pain., Disp: 25 tablet, Rfl: 2   ondansetron (ZOFRAN) 8 MG tablet, Take 8 mg by mouth every 8 (eight) hours as needed for nausea or vomiting. , Disp: , Rfl:    pantoprazole (PROTONIX) 40 MG tablet, Take 40 mg by mouth daily., Disp: , Rfl:    polyethylene glycol (MIRALAX / GLYCOLAX) 17 g packet, 1  packet mixed with 8 ounces of fluid, Disp: ,  Rfl:    rosuvastatin (CRESTOR) 20 MG tablet, Take 20 mg by mouth daily. , Disp: , Rfl:    sacubitril-valsartan (ENTRESTO) 49-51 MG, Take 1 tablet by mouth 2 (two) times daily., Disp: 180 tablet, Rfl: 3   vitamin C (VITAMIN C) 500 MG tablet, Take 1 tablet (500 mg total) by mouth 2 (two) times daily., Disp: 60 tablet, Rfl: 0   glimepiride (AMARYL) 1 MG tablet, Take 1 tablet by mouth daily. On hold (Patient not taking: Reported on 05/23/2022), Disp: , Rfl:      Radiology:   Chest X-Ray 01/07/2014: Normal chest x-ray.  Mild cardiomegaly.  No active cardiopulmonary disease.  Cardiac Studies:   Coronary angiogram 05/11/2017: Moderate nonobstructive coronary artery disease. No true left main. Ostial 50%, mid 30-40% stenoses. FFR 0.83. Medium sized diagonal with diffuse 60% disease Ostial LCx 40% with resting Pd/Pa 0.98. Mid 20-30% disease Prox RCA 40% stenosis. Normal LVEDP, LVEF 55-65%.  PCV MYOCARDIAL PERFUSION WO LEXISCAN 03/24/2020 Normal ECG stress. The patient exercised for 5 minutes and 18 seconds of a Bruce protocol, achieving approximately 7.05 METs.  Stress terminated due to dyspnea. Resting EKG normal sinus rhythm, stress EKG no ST-T wave changes with stress testing.  Frequent PVCs were evident through the exercise and in recovery. Myocardial perfusion is abnormal. There is a moderate-sized scar noted in the anterior and anterolateral and apical lateral region with moderate peri-infarct ischemia. Gated SPECT imaging of the left ventricle was abnormal, demonstrating akinesis of the apical anterior wall, apical lateral wall and mid anterior wall.  Stress LV EF is moderately dysfunctional 37%. Compared to 10/18/2012, moderate peri-infarct ischemia is new.  Previously scar was noted in similar region.  Intermediate risk.  PCV ECHOCARDIOGRAM COMPLETE 06/29/2021  Left ventricle cavity is normal in size. Moderate concentric hypertrophy of the left  ventricle. Normal global wall motion. Normal LV systolic function with EF 71%. Doppler evidence of grade I (impaired) diastolic dysfunction, normal LAP. Trileaflet aortic valve. Mild aortic valve leaflet calcification. Trace aortic regurgitation. Moderate tricuspid regurgitation. Mild pulmonic regurgitation. No evidence of pulmonary hypertension. Compared to previous study on 02/12/2020, mod TR is new.   EKG   EKG 02/14/2022: Normal sinus rhythm at rate of 60 bpm, left axis deviation, left anterior fascicular block.  Poor R wave progression, probably normal variant.  Frequent PVCs (3).  Compared to 07/07/2021, PVCs new.   EKG 04/21/2021: Normal sinus rhythm at rate of 72 bpm, left atrial enlargement, left axis deviation, left anterior fascicular block.  LVH.  Nonspecific T wave flattening.  No significant change from 02/04/2022.  Assessment     ICD-10-CM   1. Chronic diastolic heart failure  I50.32 empagliflozin (JARDIANCE) 25 MG TABS tablet    2. Type 2 diabetes mellitus with stage 3a chronic kidney disease, with long-term current use of insulin  E11.22    N18.31    Z79.4     3. Coronary artery disease of native artery of native heart with stable angina pectoris  I25.118       Meds ordered this encounter  Medications   empagliflozin (JARDIANCE) 25 MG TABS tablet    Sig: Take 1 tablet (25 mg total) by mouth daily before breakfast.    Dispense:  90 tablet    Refill:  3   sacubitril-valsartan (ENTRESTO) 49-51 MG    Sig: Take 1 tablet by mouth 2 (two) times daily.    Dispense:  180 tablet    Refill:  3     Medications Discontinued During  This Encounter  Medication Reason   famotidine (PEPCID) 40 MG tablet    Promethazine-Codeine 6.25-10 MG/5ML SOLN    empagliflozin (JARDIANCE) 10 MG TABS tablet Dose change   sacubitril-valsartan (ENTRESTO) 97-103 MG Dose change   Recommendations:   [name]  is a 82 y.o.   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  Daughter is present.  1. Chronic diastolic heart failure Patient since being on Entresto, Jardiance, has done extremely well without recurrence of heart failure.  Patient's daughter states that his blood sugar has been running high since glyburide was discontinued, hence I will go ahead and increase Jardiance to 25 mg daily. Blood pressure is soft, in view of this, I have reduced the dose of the Entresto from 97/103 mg to 49/51 mg dose to twice daily.  - empagliflozin (JARDIANCE) 25 MG TABS tablet; Take 1 tablet (25 mg total) by mouth daily before breakfast.  Dispense: 90 tablet; Refill: 3  2. Type 2 diabetes mellitus with stage 3a chronic kidney disease, with long-term current use of insulin Patient with stage III chronic kidney disease has remained stable and is tolerating both Jardiance and Entresto.  Continue the same for now.  - empagliflozin (JARDIANCE) 25 MG TABS tablet; Take 1 tablet (25 mg total) by mouth daily before breakfast.  Dispense: 90 tablet; Refill: 3  4. Coronary artery disease of native artery of native heart with stable angina pectoris From coronary artery disease standpoint, unfortunately has not had any recurrence of angina pectoris.  Presently doing well hence I did not make any changes to his anginal therapy, presently on a beta-blocker.  Continue the same.  I will see him back in 6 months for follow-up.    Yates Decamp, MD, [facility] 05/23/2022, 2:52 PM Office: [phone] Fax: [phone] Pager: [phone]

## 2022-05-25 ENCOUNTER — Telehealth: Payer: Self-pay

## 2022-05-25 NOTE — Telephone Encounter (Signed)
I am good with that. Let's see how he does. Also let her know to try to communicate via mychart. I will be sending her a letter for her sister's travel plan soon when I have time

## 2022-05-25 NOTE — Telephone Encounter (Signed)
Patients daughter called Since increasing jardiance blood sugar has gone up to 300-491, pts doc sent in glimepiride 1mg  she wanted your thoughts before starting glimepiride  May [phone]

## 2022-05-25 NOTE — Telephone Encounter (Signed)
Patients daughter aware

## 2022-06-06 ENCOUNTER — Other Ambulatory Visit: Payer: Self-pay

## 2022-06-06 MED ORDER — ENTRESTO 49-51 MG PO TABS: 1.0000 | ORAL_TABLET | Freq: Two times a day (BID) | ORAL | 3 refills | Status: DC

## 2022-06-07 ENCOUNTER — Other Ambulatory Visit: Payer: Self-pay

## 2022-06-07 DIAGNOSIS — I5032 Chronic diastolic (congestive) heart failure: Secondary | ICD-10-CM

## 2022-06-07 MED ORDER — EMPAGLIFLOZIN 25 MG PO TABS: 25.0000 mg | ORAL_TABLET | Freq: Every day | ORAL | 3 refills | Status: DC

## 2022-06-07 MED ORDER — ENTRESTO 49-51 MG PO TABS: 1.0000 | ORAL_TABLET | Freq: Two times a day (BID) | ORAL | 3 refills | Status: DC

## 2022-06-12 ENCOUNTER — Other Ambulatory Visit: Payer: Self-pay | Admitting: Cardiology

## 2022-06-12 DIAGNOSIS — I25118 Atherosclerotic heart disease of native coronary artery with other forms of angina pectoris: Secondary | ICD-10-CM

## 2022-06-13 ENCOUNTER — Encounter: Payer: Self-pay | Admitting: Cardiology

## 2022-06-13 DIAGNOSIS — I5032 Chronic diastolic (congestive) heart failure: Secondary | ICD-10-CM

## 2022-06-14 MED ORDER — FUROSEMIDE 20 MG PO TABS: 20.0000 mg | ORAL_TABLET | Freq: Every day | ORAL | 0 refills | Status: DC | PRN

## 2022-06-14 MED ORDER — FUROSEMIDE 20 MG PO TABS: 20.0000 mg | ORAL_TABLET | Freq: Every day | ORAL | 0 refills | Status: AC | PRN

## 2022-06-14 NOTE — Telephone Encounter (Signed)
From patient daughter.

## 2022-06-14 NOTE — Telephone Encounter (Signed)
ICD-10-CM   1. Chronic diastolic heart failure (HCC)  Z61.09 furosemide (LASIX) 20 MG tablet    DISCONTINUED: furosemide (LASIX) 20 MG tablet     Meds ordered this encounter  Medications   DISCONTD: furosemide (LASIX) 20 MG tablet    Sig: Take 1 tablet (20 mg total) by mouth daily as needed for edema (Leg swelling).    Dispense:  60 tablet    Refill:  0   furosemide (LASIX) 20 MG tablet    Sig: Take 1 tablet (20 mg total) by mouth daily as needed for edema (Leg swelling).    Dispense:  60 tablet    Refill:  0

## 2022-09-22 ENCOUNTER — Other Ambulatory Visit: Payer: Self-pay

## 2022-09-22 DIAGNOSIS — K219 Gastro-esophageal reflux disease without esophagitis: Secondary | ICD-10-CM | POA: Diagnosis not present

## 2022-09-22 DIAGNOSIS — N183 Chronic kidney disease, stage 3 unspecified: Secondary | ICD-10-CM | POA: Diagnosis not present

## 2022-09-22 DIAGNOSIS — I129 Hypertensive chronic kidney disease with stage 1 through stage 4 chronic kidney disease, or unspecified chronic kidney disease: Secondary | ICD-10-CM | POA: Diagnosis not present

## 2022-09-22 DIAGNOSIS — Z8572 Personal history of non-Hodgkin lymphomas: Secondary | ICD-10-CM | POA: Diagnosis not present

## 2022-09-22 DIAGNOSIS — I130 Hypertensive heart and chronic kidney disease with heart failure and stage 1 through stage 4 chronic kidney disease, or unspecified chronic kidney disease: Secondary | ICD-10-CM | POA: Diagnosis not present

## 2022-09-22 DIAGNOSIS — I25119 Atherosclerotic heart disease of native coronary artery with unspecified angina pectoris: Secondary | ICD-10-CM | POA: Diagnosis not present

## 2022-09-22 DIAGNOSIS — I700 Atherosclerosis of aorta: Secondary | ICD-10-CM | POA: Diagnosis not present

## 2022-09-22 DIAGNOSIS — E782 Mixed hyperlipidemia: Secondary | ICD-10-CM | POA: Diagnosis not present

## 2022-09-22 DIAGNOSIS — Z0000 Encounter for general adult medical examination without abnormal findings: Secondary | ICD-10-CM | POA: Diagnosis not present

## 2022-09-22 DIAGNOSIS — E1129 Type 2 diabetes mellitus with other diabetic kidney complication: Secondary | ICD-10-CM | POA: Diagnosis not present

## 2022-09-22 DIAGNOSIS — I509 Heart failure, unspecified: Secondary | ICD-10-CM | POA: Diagnosis not present

## 2022-09-22 DIAGNOSIS — I5032 Chronic diastolic (congestive) heart failure: Secondary | ICD-10-CM

## 2022-09-22 DIAGNOSIS — E1122 Type 2 diabetes mellitus with diabetic chronic kidney disease: Secondary | ICD-10-CM | POA: Diagnosis not present

## 2022-09-22 MED ORDER — EMPAGLIFLOZIN 25 MG PO TABS
25.0000 mg | ORAL_TABLET | Freq: Every day | ORAL | 3 refills | Status: DC
Start: 2022-09-22 — End: 2023-01-05

## 2022-10-25 ENCOUNTER — Other Ambulatory Visit (HOSPITAL_COMMUNITY): Payer: Self-pay

## 2022-10-26 ENCOUNTER — Encounter: Payer: Self-pay | Admitting: Pharmacist

## 2022-10-26 ENCOUNTER — Other Ambulatory Visit: Payer: Self-pay

## 2022-10-26 ENCOUNTER — Other Ambulatory Visit (HOSPITAL_COMMUNITY): Payer: Self-pay

## 2022-10-26 MED ORDER — GLIMEPIRIDE 1 MG PO TABS
1.0000 mg | ORAL_TABLET | Freq: Every day | ORAL | 0 refills | Status: DC
  Filled 2022-10-26: qty 90, 90d supply, fill #0
  Filled 2022-12-28 (×2): qty 30, 30d supply, fill #0
  Filled 2023-01-24 – 2023-01-25 (×2): qty 30, 30d supply, fill #1
  Filled 2023-02-12 – 2023-03-02 (×6): qty 30, 30d supply, fill #2

## 2022-10-26 MED ORDER — PANTOPRAZOLE SODIUM 40 MG PO TBEC
40.0000 mg | DELAYED_RELEASE_TABLET | Freq: Every day | ORAL | 0 refills | Status: DC
  Filled 2022-10-26: qty 90, 90d supply, fill #0
  Filled 2022-12-28 (×2): qty 30, 30d supply, fill #0
  Filled 2023-01-24 – 2023-01-25 (×2): qty 30, 30d supply, fill #1
  Filled 2023-02-12 – 2023-03-02 (×6): qty 30, 30d supply, fill #2

## 2022-10-26 MED ORDER — LABETALOL HCL 200 MG PO TABS
100.0000 mg | ORAL_TABLET | Freq: Two times a day (BID) | ORAL | 0 refills | Status: DC
  Filled 2022-10-26: qty 90, 90d supply, fill #0

## 2022-10-26 MED ORDER — ROSUVASTATIN CALCIUM 40 MG PO TABS
40.0000 mg | ORAL_TABLET | Freq: Every day | ORAL | 0 refills | Status: DC
  Filled 2022-10-26: qty 90, 90d supply, fill #0
  Filled 2022-12-28 (×2): qty 30, 30d supply, fill #0
  Filled 2023-01-24 – 2023-01-25 (×2): qty 30, 30d supply, fill #1
  Filled 2023-02-12 – 2023-03-02 (×6): qty 30, 30d supply, fill #2

## 2022-10-27 ENCOUNTER — Other Ambulatory Visit (HOSPITAL_COMMUNITY): Payer: Self-pay

## 2022-10-30 ENCOUNTER — Other Ambulatory Visit (HOSPITAL_COMMUNITY): Payer: Self-pay

## 2022-10-31 ENCOUNTER — Other Ambulatory Visit (HOSPITAL_COMMUNITY): Payer: Self-pay

## 2022-11-20 ENCOUNTER — Other Ambulatory Visit: Payer: Self-pay

## 2022-11-20 ENCOUNTER — Other Ambulatory Visit (HOSPITAL_COMMUNITY): Payer: Self-pay

## 2022-11-21 ENCOUNTER — Encounter: Payer: Self-pay | Admitting: Cardiology

## 2022-11-21 ENCOUNTER — Ambulatory Visit: Payer: Medicare Other | Attending: Cardiology | Admitting: Cardiology

## 2022-11-21 VITALS — BP 106/68 | HR 67 | Resp 16 | Ht 64.0 in | Wt 164.8 lb

## 2022-11-21 DIAGNOSIS — I10 Essential (primary) hypertension: Secondary | ICD-10-CM | POA: Diagnosis not present

## 2022-11-21 DIAGNOSIS — I5032 Chronic diastolic (congestive) heart failure: Secondary | ICD-10-CM

## 2022-11-21 DIAGNOSIS — N1832 Chronic kidney disease, stage 3b: Secondary | ICD-10-CM | POA: Diagnosis not present

## 2022-11-21 DIAGNOSIS — I25118 Atherosclerotic heart disease of native coronary artery with other forms of angina pectoris: Secondary | ICD-10-CM

## 2022-11-21 DIAGNOSIS — E782 Mixed hyperlipidemia: Secondary | ICD-10-CM | POA: Diagnosis not present

## 2022-11-21 NOTE — Progress Notes (Signed)
Cardiology Office Note:  .   Date:  11/21/2022  ID:  [name], DOB [birth year], MRN [id number] PCP: Blair Heys, MD  [facility] Providers Cardiologist:  Yates Decamp, MD    History of Present Illness: [name] is a 82 y.o. 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 Daughter is present.   He is presently asymptomatic, states that since being on Entresto and also on Jardiance, he has not had any worsening dyspnea or leg edema.  His diabetes is also now improved.  He recently made a trip to [place] and is returned back.  Review of Systems  Cardiovascular:  Negative for chest pain, dyspnea on exertion and leg swelling.    Risk Assessment/Calculations:     External Labs:  Labs 09/22/2022:  Total cholesterol 194, triglycerides 278, HDL 45, LDL 102.  BUN 21, creatinine 1.5, EGFR 44 mL.  A1c 6.8%.    Cholesterol, total 169.000 m 08/29/2021 HDL 41.000 mg 08/29/2021 LDL 87.000 mg 08/29/2021 Triglycerides 246.000 m 08/29/2021   A1C 7.100 % 08/29/2021   Hemoglobin 12.300 g/d 06/25/2020 Platelets 154.000 X1 06/25/2020   Creatinine, Serum 1.800 mg/ 08/29/2021 Potassium 4.800 mm 08/29/2021 ALT (SGPT) 24.000 U/L 08/29/2021  Physical Exam:   VS:  BP 106/68 (BP Location: Left Arm, Patient Position: Sitting, Cuff Size: Large)   Pulse 67   Resp 16   Ht 5\' 4"  (1.626 m)   Wt 164 lb 12.8 oz (74.8 kg)   SpO2 96%   BMI 28.29 kg/m    Wt Readings from Last 3 Encounters:  11/21/22 164 lb 12.8 oz (74.8 kg)  05/23/22 157 lb (71.2 kg)  02/14/22 164 lb (74.4 kg)     Physical Exam Neck:     Vascular: No carotid bruit or JVD.  Cardiovascular:     Rate and Rhythm: Normal rate and regular rhythm.     Pulses: Intact distal  pulses.     Heart sounds: Normal heart sounds. No murmur heard.    No gallop.  Pulmonary:     Effort: Pulmonary effort is normal.     Breath sounds: Normal breath sounds.  Abdominal:     General: Bowel sounds are normal.     Palpations: Abdomen is soft.  Musculoskeletal:     Right lower leg: No edema.     Left lower leg: No edema.     Studies Reviewed: Marland Kitchen    EKG:    EKG Interpretation Date/Time:  Tuesday November 21 2022 13:58:18 EDT Ventricular Rate:  64 PR Interval:  158 QRS Duration:  98 QT Interval:  380 QTC Calculation: 392 R Axis:   -44  Text Interpretation: EKG 11/21/2022: Normal sinus rhythm at rate of 64 bpm, left anterior fascicular block, poor R progression, probably normal variant.  T wave abnormality, cannot exclude lateral ischemia.  Compared to 05/25/2020, T wave abnormality in 1 and aVL and V5 and V6 is new. Confirmed by Delrae Rend [phone]) on 11/21/2022 2:08:04 PM no significant change from 02/14/2022, previously noted PVCs no longer present.   PCV MYOCARDIAL PERFUSION WO LEXISCAN 03/24/2020 Normal ECG stress. The patient exercised for 5 minutes and 18 seconds of a Bruce protocol, achieving approximately 7.05 METs.  Stress terminated due to dyspnea. Resting EKG normal sinus rhythm, stress EKG no  ST-T wave changes with stress testing.  Frequent PVCs were evident through the exercise and in recovery. Myocardial perfusion is abnormal. There is a moderate-sized scar noted in the anterior and anterolateral and apical lateral region with moderate peri-infarct ischemia. Gated SPECT imaging of the left ventricle was abnormal, demonstrating akinesis of the apical anterior wall, apical lateral wall and mid anterior wall.  Stress LV EF is moderately dysfunctional 37%. Compared to 10/18/2012, moderate peri-infarct ischemia is new.  Previously scar was noted in similar region.  Intermediate risk.   PCV ECHOCARDIOGRAM COMPLETE 06/29/2021  Left ventricle cavity is normal in  size. Moderate concentric hypertrophy of the left ventricle. Normal global wall motion. Normal LV systolic function with EF 71%. Doppler evidence of grade I (impaired) diastolic dysfunction, normal LAP. Trileaflet aortic valve. Mild aortic valve leaflet calcification. Trace aortic regurgitation. Moderate tricuspid regurgitation. Mild pulmonic regurgitation. No evidence of pulmonary hypertension. Compared to previous study on 02/12/2020, mod TR is new.   ASSESSMENT AND PLAN: .      ICD-10-CM   1. Coronary artery disease of native artery of native heart with stable angina pectoris (HCC)  I25.118 EKG 12-Lead    2. Chronic diastolic heart failure (HCC)  Z61.09     3. Essential hypertension  I10     4. Stage 3b chronic kidney disease (HCC)  N18.32     5. Mixed hyperlipidemia  E78.2       Assessment and Plan  1. Coronary artery disease of native artery of native heart with stable angina pectoris [facility]) Patient remains stable from cardiac standpoint and he has not had any recent worsening anginal, no new EKG abnormality.  He has got high lateral T wave abnormalities that are stable from previous.  Continue present medical management, Aspirin, labetalol, sublingual nitroglycerin as needed and Entresto.  Renal function has remained stable. - EKG 12-Lead  2. Chronic diastolic heart failure (HCC) Since being on Entresto and Jardiance, patient has not had any further acute decompensated heart failure.  3. Essential hypertension Blood pressure is well-controlled.  Continue present management.  4. Stage 3b chronic kidney disease (HCC) I reviewed his external labs, renal function has remained stable.  5.  Mixed hypercholesterolemia With regard to hypercholesterolemia, he was presently tolerating Crestor to 40 mg daily without any side effects, however due to confusion he is back on 20 mg and hence I see a marked change in his lipid status.  This is being addressed by his PCP.  They will go back  to higher dose once he finishes 20 mg dose as his daughter is concerned that the medication comes in package and she does not want completely confused.  I agreed with the plan.  I will see him back in a year or sooner if problems.  Signed,  Yates Decamp, MD, [facility] 11/21/2022, 2:22 PM

## 2022-11-21 NOTE — Patient Instructions (Signed)
Medication Instructions:  Your physician recommends that you continue on your current medications as directed. Please refer to the Current Medication list given to you today.  *If you need a refill on your cardiac medications before your next appointment, please call your pharmacy*  Lab Work: If you have labs (blood work) drawn today and your tests are completely normal, you will receive your results only by: MyChart Message (if you have MyChart) OR A paper copy in the mail If you have any lab test that is abnormal or we need to change your treatment, we will call you to review the results.  Follow-Up: At [facility], you and your health needs are our priority.  As part of our continuing mission to provide you with exceptional heart care, we have created designated Provider Care Teams.  These Care Teams include your primary Cardiologist (physician) and Advanced Practice Providers (APPs -  Physician Assistants and Nurse Practitioners) who all work together to provide you with the care you need, when you need it.  We recommend signing up for the patient portal called "MyChart".  Sign up information is provided on this After Visit Summary.  MyChart is used to connect with patients for Virtual Visits (Telemedicine).  Patients are able to view lab/test results, encounter notes, upcoming appointments, etc.  Non-urgent messages can be sent to your provider as well.   To learn more about what you can do with MyChart, go to [email or web address].    Your next appointment:   1 year(s)  Provider:   Yates Decamp, MD

## 2022-11-25 DIAGNOSIS — Z23 Encounter for immunization: Secondary | ICD-10-CM | POA: Diagnosis not present

## 2022-12-27 ENCOUNTER — Other Ambulatory Visit (HOSPITAL_COMMUNITY): Payer: Self-pay

## 2022-12-28 ENCOUNTER — Other Ambulatory Visit (HOSPITAL_COMMUNITY): Payer: Self-pay

## 2022-12-28 ENCOUNTER — Other Ambulatory Visit: Payer: Self-pay

## 2022-12-28 MED ORDER — VITAMIN C 500 MG PO TABS
500.0000 mg | ORAL_TABLET | Freq: Two times a day (BID) | ORAL | 0 refills | Status: AC
  Filled 2022-12-28: qty 60, 30d supply, fill #0
  Filled 2023-01-24 – 2023-01-25 (×2): qty 60, 30d supply, fill #1
  Filled 2023-02-12 – 2023-03-02 (×6): qty 60, 30d supply, fill #2

## 2022-12-28 MED ORDER — LABETALOL HCL 100 MG PO TABS
100.0000 mg | ORAL_TABLET | Freq: Two times a day (BID) | ORAL | 0 refills | Status: DC
  Filled 2022-12-28: qty 60, 30d supply, fill #0
  Filled 2023-01-24: qty 30, 15d supply, fill #1
  Filled 2023-01-25: qty 60, 30d supply, fill #1

## 2022-12-28 MED ORDER — ASPIRIN 81 MG PO CHEW
81.0000 mg | CHEWABLE_TABLET | Freq: Every morning | ORAL | 0 refills | Status: DC
  Filled 2022-12-28: qty 30, 30d supply, fill #0
  Filled 2023-01-24 – 2023-01-25 (×2): qty 30, 30d supply, fill #1
  Filled 2023-02-12 – 2023-03-02 (×6): qty 30, 30d supply, fill #2

## 2022-12-28 MED ORDER — ALIGN 4 MG PO CAPS
4.0000 mg | ORAL_CAPSULE | Freq: Every day | ORAL | 0 refills | Status: AC
  Filled 2022-12-28 – 2022-12-29 (×2): qty 28, 28d supply, fill #0
  Filled 2023-01-24 – 2023-01-25 (×3): qty 28, 28d supply, fill #1
  Filled 2023-02-12 – 2023-02-20 (×2): qty 28, 28d supply, fill #2
  Filled 2023-02-22: qty 30, 30d supply, fill #2
  Filled 2023-02-23: qty 28, 28d supply, fill #2

## 2022-12-29 ENCOUNTER — Other Ambulatory Visit: Payer: Self-pay

## 2023-01-01 ENCOUNTER — Other Ambulatory Visit (HOSPITAL_COMMUNITY): Payer: Self-pay

## 2023-01-02 ENCOUNTER — Other Ambulatory Visit: Payer: Self-pay

## 2023-01-04 ENCOUNTER — Encounter: Payer: Self-pay | Admitting: Cardiology

## 2023-01-04 DIAGNOSIS — I5032 Chronic diastolic (congestive) heart failure: Secondary | ICD-10-CM

## 2023-01-05 ENCOUNTER — Telehealth: Payer: Self-pay | Admitting: Cardiology

## 2023-01-05 ENCOUNTER — Other Ambulatory Visit (HOSPITAL_COMMUNITY): Payer: Self-pay

## 2023-01-05 MED ORDER — EMPAGLIFLOZIN 25 MG PO TABS: 25.0000 mg | ORAL_TABLET | Freq: Every day | ORAL | 2 refills | Status: DC

## 2023-01-05 MED ORDER — EMPAGLIFLOZIN 25 MG PO TABS: 25.0000 mg | ORAL_TABLET | Freq: Every day | ORAL | 0 refills | Status: DC

## 2023-01-05 MED ORDER — EMPAGLIFLOZIN 25 MG PO TABS
25.0000 mg | ORAL_TABLET | Freq: Every day | ORAL | 0 refills | Status: DC
  Filled 2023-01-05: qty 30, 30d supply, fill #0

## 2023-01-05 NOTE — Telephone Encounter (Signed)
Pt's daughter returning call to nurse. She state she has had not call from [place]. I told her nurse tried to call her twice but it said no voicemail. She said she has voicemail and no one has called her. She would like a call back or send a my chart message

## 2023-01-05 NOTE — Telephone Encounter (Signed)
Pt c/o medication issue:  1. Name of Medication:   empagliflozin (JARDIANCE) 25 MG TABS tablet   2. How are you currently taking this medication (dosage and times per day)?   As prescribed  3. Are you having a reaction (difficulty breathing--STAT)?   No  4. What is your medication issue?    Daughter (May) stated patient has been getting assistance with this medication and wants to stay in the program.  Daughter stated patient is now out of this medication and will need samples until patient gets back into the program.  Daughter wants to know next steps on getting re-enrolled in the program.

## 2023-01-05 NOTE — Telephone Encounter (Signed)
No voice mail, unable to leave message.

## 2023-01-05 NOTE — Telephone Encounter (Signed)
Mychart message sent.

## 2023-01-06 ENCOUNTER — Other Ambulatory Visit (HOSPITAL_COMMUNITY): Payer: Self-pay

## 2023-01-06 ENCOUNTER — Other Ambulatory Visit: Payer: Self-pay | Admitting: Cardiology

## 2023-01-06 DIAGNOSIS — I5032 Chronic diastolic (congestive) heart failure: Secondary | ICD-10-CM

## 2023-01-06 MED ORDER — EMPAGLIFLOZIN 25 MG PO TABS
25.0000 mg | ORAL_TABLET | Freq: Every day | ORAL | 0 refills | Status: DC
  Filled 2023-01-06: qty 90, 90d supply, fill #0
  Filled 2023-01-06: qty 30, 30d supply, fill #0
  Filled 2023-01-24 – 2023-03-15 (×5): qty 30, 30d supply, fill #1
  Filled 2023-04-12 – 2023-04-13 (×4): qty 30, 30d supply, fill #2

## 2023-01-06 NOTE — Progress Notes (Signed)
Patient's daughter called in requesting refill of jardiance.  Jardiance 25mg  #90tabs, no refills sent to [facility] outpatient pharmacy

## 2023-01-08 ENCOUNTER — Other Ambulatory Visit (HOSPITAL_COMMUNITY): Payer: Self-pay

## 2023-01-08 NOTE — Telephone Encounter (Signed)
I spoke with [facility] pharmacy and confirmed prescription was sent to the pharmacy and patient has picked it up

## 2023-01-09 ENCOUNTER — Other Ambulatory Visit: Payer: Self-pay

## 2023-01-12 ENCOUNTER — Encounter: Payer: Self-pay | Admitting: Cardiology

## 2023-01-13 ENCOUNTER — Other Ambulatory Visit (HOSPITAL_COMMUNITY): Payer: Self-pay

## 2023-01-13 ENCOUNTER — Other Ambulatory Visit: Payer: Self-pay | Admitting: Physician Assistant

## 2023-01-13 MED ORDER — ENTRESTO 49-51 MG PO TABS
1.0000 | ORAL_TABLET | Freq: Two times a day (BID) | ORAL | 3 refills | Status: DC
  Filled 2023-01-13: qty 180, 90d supply, fill #0
  Filled 2023-01-13: qty 60, 30d supply, fill #0
  Filled 2023-01-24 – 2023-02-05 (×4): qty 60, 30d supply, fill #1
  Filled 2023-02-12 – 2023-03-15 (×11): qty 60, 30d supply, fill #2
  Filled 2023-04-12 – 2023-04-13 (×4): qty 60, 30d supply, fill #3
  Filled 2023-04-16 – 2023-05-17 (×3): qty 60, 30d supply, fill #4
  Filled 2023-05-17: qty 10, 5d supply, fill #4
  Filled 2023-05-17: qty 60, 30d supply, fill #4
  Filled 2023-05-17 (×2): qty 10, 5d supply, fill #4
  Filled 2023-05-22 (×2): qty 60, 30d supply, fill #5
  Filled 2023-06-05 – 2023-06-22 (×5): qty 60, 30d supply, fill #6
  Filled 2023-07-11 – 2023-07-19 (×3): qty 60, 30d supply, fill #7
  Filled 2023-08-10 – 2023-08-14 (×2): qty 60, 30d supply, fill #8
  Filled 2023-09-13: qty 60, 30d supply, fill #9
  Filled 2023-10-11: qty 60, 30d supply, fill #10
  Filled 2023-11-13: qty 60, 30d supply, fill #11
  Filled ????-??-??: fill #4

## 2023-01-24 ENCOUNTER — Other Ambulatory Visit: Payer: Self-pay

## 2023-01-25 ENCOUNTER — Other Ambulatory Visit (HOSPITAL_COMMUNITY): Payer: Self-pay

## 2023-01-25 ENCOUNTER — Other Ambulatory Visit: Payer: Self-pay

## 2023-01-25 MED ORDER — LABETALOL HCL 100 MG PO TABS
100.0000 mg | ORAL_TABLET | Freq: Two times a day (BID) | ORAL | 0 refills | Status: DC
  Filled 2023-01-25: qty 60, 30d supply, fill #0
  Filled 2023-02-12 – 2023-03-02 (×6): qty 60, 30d supply, fill #1
  Filled 2023-03-28 – 2023-03-29 (×2): qty 60, 30d supply, fill #2

## 2023-01-28 DIAGNOSIS — J018 Other acute sinusitis: Secondary | ICD-10-CM | POA: Diagnosis not present

## 2023-01-28 DIAGNOSIS — R051 Acute cough: Secondary | ICD-10-CM | POA: Diagnosis not present

## 2023-01-29 ENCOUNTER — Other Ambulatory Visit: Payer: Self-pay

## 2023-02-01 ENCOUNTER — Other Ambulatory Visit (HOSPITAL_COMMUNITY): Payer: Self-pay

## 2023-02-02 ENCOUNTER — Telehealth: Payer: Self-pay

## 2023-02-02 NOTE — Telephone Encounter (Signed)
 PAP: PAP application for [employer]) has been mailed to [employer] home address on file. Will fax provider portion of application to provider's office when pt's portion is received.

## 2023-02-02 NOTE — Telephone Encounter (Signed)
Thank you :)

## 2023-02-02 NOTE — Telephone Encounter (Signed)
Mailing application to patient.

## 2023-02-05 ENCOUNTER — Other Ambulatory Visit (HOSPITAL_COMMUNITY): Payer: Self-pay

## 2023-02-05 ENCOUNTER — Other Ambulatory Visit: Payer: Self-pay

## 2023-02-05 MED ORDER — ENTRESTO 49-51 MG PO TABS: 1.0000 | ORAL_TABLET | Freq: Two times a day (BID) | ORAL | 0 refills | Status: AC

## 2023-02-12 ENCOUNTER — Other Ambulatory Visit (HOSPITAL_COMMUNITY): Payer: Self-pay

## 2023-02-13 ENCOUNTER — Other Ambulatory Visit: Payer: Self-pay

## 2023-02-20 ENCOUNTER — Other Ambulatory Visit: Payer: Self-pay

## 2023-02-20 ENCOUNTER — Other Ambulatory Visit (HOSPITAL_COMMUNITY): Payer: Self-pay

## 2023-02-20 ENCOUNTER — Other Ambulatory Visit (HOSPITAL_BASED_OUTPATIENT_CLINIC_OR_DEPARTMENT_OTHER): Payer: Self-pay

## 2023-02-21 ENCOUNTER — Other Ambulatory Visit: Payer: Self-pay

## 2023-02-22 ENCOUNTER — Other Ambulatory Visit (HOSPITAL_COMMUNITY): Payer: Self-pay

## 2023-02-22 ENCOUNTER — Other Ambulatory Visit: Payer: Self-pay

## 2023-02-23 ENCOUNTER — Other Ambulatory Visit: Payer: Self-pay

## 2023-02-27 ENCOUNTER — Other Ambulatory Visit: Payer: Self-pay

## 2023-02-28 ENCOUNTER — Other Ambulatory Visit: Payer: Self-pay

## 2023-02-28 ENCOUNTER — Other Ambulatory Visit (HOSPITAL_COMMUNITY): Payer: Self-pay

## 2023-03-01 ENCOUNTER — Other Ambulatory Visit: Payer: Self-pay

## 2023-03-01 ENCOUNTER — Other Ambulatory Visit (HOSPITAL_COMMUNITY): Payer: Self-pay

## 2023-03-02 ENCOUNTER — Other Ambulatory Visit: Payer: Self-pay

## 2023-03-02 ENCOUNTER — Other Ambulatory Visit (HOSPITAL_COMMUNITY): Payer: Self-pay

## 2023-03-03 ENCOUNTER — Other Ambulatory Visit (HOSPITAL_COMMUNITY): Payer: Self-pay

## 2023-03-05 ENCOUNTER — Other Ambulatory Visit: Payer: Self-pay

## 2023-03-12 ENCOUNTER — Other Ambulatory Visit (HOSPITAL_COMMUNITY): Payer: Self-pay

## 2023-03-12 ENCOUNTER — Other Ambulatory Visit: Payer: Self-pay

## 2023-03-12 MED ORDER — LINZESS 72 MCG PO CAPS
72.0000 ug | ORAL_CAPSULE | Freq: Every day | ORAL | 11 refills | Status: AC
  Filled 2023-03-12 – 2023-03-15 (×6): qty 30, 30d supply, fill #0
  Filled 2023-04-12 – 2023-04-13 (×4): qty 30, 30d supply, fill #1
  Filled 2023-04-16 – 2023-05-22 (×6): qty 30, 30d supply, fill #2
  Filled 2023-06-05 – 2023-06-22 (×5): qty 30, 30d supply, fill #3
  Filled 2023-07-11 – 2023-07-20 (×4): qty 30, 30d supply, fill #4
  Filled 2023-08-10 – 2023-08-15 (×3): qty 30, 30d supply, fill #5
  Filled 2023-09-13 (×2): qty 30, 30d supply, fill #6
  Filled 2023-10-11 (×2): qty 30, 30d supply, fill #7
  Filled 2023-11-13 – 2023-11-15 (×2): qty 30, 30d supply, fill #8
  Filled 2023-12-11 – 2023-12-12 (×2): qty 30, 30d supply, fill #9
  Filled 2024-01-02 – 2024-01-04 (×2): qty 30, 30d supply, fill #10
  Filled 2024-03-06: qty 30, 30d supply, fill #11
  Filled ????-??-??: fill #2

## 2023-03-13 ENCOUNTER — Other Ambulatory Visit (HOSPITAL_COMMUNITY): Payer: Self-pay

## 2023-03-13 ENCOUNTER — Other Ambulatory Visit: Payer: Self-pay

## 2023-03-14 ENCOUNTER — Other Ambulatory Visit (HOSPITAL_COMMUNITY): Payer: Self-pay

## 2023-03-15 ENCOUNTER — Other Ambulatory Visit (HOSPITAL_COMMUNITY): Payer: Self-pay

## 2023-03-15 ENCOUNTER — Other Ambulatory Visit: Payer: Self-pay

## 2023-03-15 ENCOUNTER — Telehealth: Payer: Self-pay

## 2023-03-15 ENCOUNTER — Telehealth: Payer: Self-pay | Admitting: Cardiology

## 2023-03-15 NOTE — Telephone Encounter (Signed)
Patient enrolled in [employer] grant. Billing info added to [facility]. Patient informed of grant approval via mychart.

## 2023-03-15 NOTE — Telephone Encounter (Signed)
Patient Advocate Encounter   The patient was approved for a [employer] grant that will help cover the cost of Entresto Total amount awarded, $10,000.  Effective: 02/13/23 - 02/12/24   [id number] [id number]   Pharmacy provided with approval and processing information. Patient informed via Dorcas Carrow, CPhT  Pharmacy Patient Advocate Specialist  Direct Number: [phone] Fax: [phone]

## 2023-03-15 NOTE — Telephone Encounter (Signed)
Patient calling the office for samples of medication:   1.  What medication and dosage are you requesting samples for? Entresto  2.  Are you currently out of this medication?  Only a few days left

## 2023-03-17 ENCOUNTER — Emergency Department (HOSPITAL_COMMUNITY)
Admission: EM | Admit: 2023-03-17 | Discharge: 2023-03-18 | Disposition: A | Discharge status: Home / Self Care | Payer: Medicare Other | Attending: Emergency Medicine | Admitting: Emergency Medicine

## 2023-03-17 ENCOUNTER — Other Ambulatory Visit: Payer: Self-pay

## 2023-03-17 ENCOUNTER — Emergency Department (HOSPITAL_COMMUNITY): Payer: Medicare Other

## 2023-03-17 ENCOUNTER — Encounter (HOSPITAL_COMMUNITY): Payer: Self-pay | Admitting: *Deleted

## 2023-03-17 ENCOUNTER — Other Ambulatory Visit (HOSPITAL_COMMUNITY): Payer: Self-pay

## 2023-03-17 DIAGNOSIS — N1831 Chronic kidney disease, stage 3a: Secondary | ICD-10-CM | POA: Diagnosis not present

## 2023-03-17 DIAGNOSIS — I2510 Atherosclerotic heart disease of native coronary artery without angina pectoris: Secondary | ICD-10-CM | POA: Insufficient documentation

## 2023-03-17 DIAGNOSIS — Z79899 Other long term (current) drug therapy: Secondary | ICD-10-CM | POA: Insufficient documentation

## 2023-03-17 DIAGNOSIS — R072 Precordial pain: Secondary | ICD-10-CM | POA: Diagnosis present

## 2023-03-17 DIAGNOSIS — I130 Hypertensive heart and chronic kidney disease with heart failure and stage 1 through stage 4 chronic kidney disease, or unspecified chronic kidney disease: Secondary | ICD-10-CM | POA: Insufficient documentation

## 2023-03-17 DIAGNOSIS — R0789 Other chest pain: Secondary | ICD-10-CM

## 2023-03-17 DIAGNOSIS — Z7982 Long term (current) use of aspirin: Secondary | ICD-10-CM | POA: Insufficient documentation

## 2023-03-17 DIAGNOSIS — E1122 Type 2 diabetes mellitus with diabetic chronic kidney disease: Secondary | ICD-10-CM | POA: Insufficient documentation

## 2023-03-17 DIAGNOSIS — I493 Ventricular premature depolarization: Secondary | ICD-10-CM

## 2023-03-17 DIAGNOSIS — M546 Pain in thoracic spine: Secondary | ICD-10-CM | POA: Diagnosis not present

## 2023-03-17 DIAGNOSIS — I509 Heart failure, unspecified: Secondary | ICD-10-CM | POA: Diagnosis not present

## 2023-03-17 LAB — COMPREHENSIVE METABOLIC PANEL
ALT: 27 U/L (ref 0–44)
AST: 23 U/L (ref 15–41)
Albumin: 4 g/dL (ref 3.5–5.0)
Alkaline Phosphatase: 62 U/L (ref 38–126)
Anion gap: 10 (ref 5–15)
BUN: 18 mg/dL (ref 8–23)
CO2: 26 mmol/L (ref 22–32)
Calcium: 9.4 mg/dL (ref 8.9–10.3)
Chloride: 104 mmol/L (ref 98–111)
Creatinine, Ser: 1.46 mg/dL — ABNORMAL HIGH (ref 0.61–1.24)
GFR, Estimated: 47 mL/min — ABNORMAL LOW (ref >60)
Glucose, Bld: 100 mg/dL — ABNORMAL HIGH (ref 70–99)
Potassium: 4 mmol/L (ref 3.5–5.1)
Sodium: 140 mmol/L (ref 135–145)
Total Bilirubin: 0.9 mg/dL (ref 0.0–1.2)
Total Protein: 7.2 g/dL (ref 6.5–8.1)

## 2023-03-17 LAB — CBC
HCT: 44.7 % (ref 39.0–52.0)
Hemoglobin: 14.7 g/dL (ref 13.0–17.0)
MCH: 27.1 pg (ref 26.0–34.0)
MCHC: 32.9 g/dL (ref 30.0–36.0)
MCV: 82.3 fL (ref 80.0–100.0)
Platelets: 153 10*3/uL (ref 150–400)
RBC: 5.43 MIL/uL (ref 4.22–5.81)
RDW: 13.5 % (ref 11.5–15.5)
WBC: 5.6 10*3/uL (ref 4.0–10.5)
nRBC: 0 % (ref 0.0–0.2)

## 2023-03-17 LAB — TROPONIN I (HIGH SENSITIVITY): Troponin I (High Sensitivity): 8 ng/L (ref <18)

## 2023-03-17 LAB — LIPASE, BLOOD: Lipase: 38 U/L (ref 11–51)

## 2023-03-17 MED ORDER — ASPIRIN 81 MG PO CHEW
324.0000 mg | CHEWABLE_TABLET | Freq: Once | ORAL | Status: AC
  Administered 2023-03-17: 324 mg via ORAL
  Filled 2023-03-17: qty 4

## 2023-03-17 NOTE — ED Provider Triage Note (Signed)
Emergency Medicine Provider Triage Evaluation Note  [name] , a 83 y.o. male  was evaluated in triage.  Pt complains of chest pain. States same began yesterday evening and has been intermittent since then.  Pain is lower substernal area and does not radiate.  Denies nausea or vomiting.  Does have history of stent placement.  States his pain is worse with movement.  Review of Systems  Positive:  Negative:   Physical Exam  BP (!) 172/84 (BP Location: Left Arm)   Pulse 63   Temp 97.9 F (36.6 C)   Resp 17   Ht 5\' 4"  (1.626 m)   Wt 74.8 kg   SpO2 99%   BMI 28.31 kg/m  Gen:   Awake, no distress   Resp:  Normal effort  MSK:   Moves extremities without difficulty  Other:    Medical Decision Making  Medically screening exam initiated at 8:21 PM.  Appropriate orders placed.  [name] was informed that the remainder of the evaluation will be completed by another provider, this initial triage assessment does not replace that evaluation, and the importance of remaining in the ED until their evaluation is complete.     [name] 03/17/23 2027

## 2023-03-17 NOTE — ED Triage Notes (Signed)
The pt has been c/o chest pain since yesterday no sob nausea   he has had a mi and a  stent has been placed in his coronary arteries

## 2023-03-18 LAB — TROPONIN I (HIGH SENSITIVITY): Troponin I (High Sensitivity): 8 ng/L (ref <18)

## 2023-03-18 MED ORDER — METHOCARBAMOL 500 MG PO TABS
500.0000 mg | ORAL_TABLET | Freq: Once | ORAL | Status: AC
  Administered 2023-03-18: 500 mg via ORAL
  Filled 2023-03-18: qty 1

## 2023-03-18 MED ORDER — METHOCARBAMOL 500 MG PO TABS: 500.0000 mg | ORAL_TABLET | Freq: Two times a day (BID) | ORAL | 0 refills | Status: AC | PRN

## 2023-03-18 MED ORDER — NITROGLYCERIN 0.4 MG SL SUBL
0.4000 mg | SUBLINGUAL_TABLET | Freq: Once | SUBLINGUAL | Status: AC
  Administered 2023-03-18: 0.4 mg via SUBLINGUAL
  Filled 2023-03-18: qty 1

## 2023-03-18 NOTE — ED Provider Notes (Signed)
[place] EMERGENCY DEPARTMENT AT [facility] Provider Note   CSN: [id number] Arrival date & time: 03/17/23  4098     History  No chief complaint on file.   [name] is a 83 y.o. male.  83 y/o male with hx of CAD s/p PTCA to diagonal 2 for acute MI in 2005, HTN, HLD, DM, stage IIIa CKD, and dCHF (LVEF 70% in 2023) presents to the emergency department for evaluation of chest pain.  He describes an aching pain across his lower substernal chest as well as his upper back which has been intermittent since 2 days ago.  He notices some improvement to his discomfort with stretching back his arms, though his pain is also made worse by twisting, moving.  No specifically exertional and without associated fever, SOB, lightheadedness, syncope/near syncope, leg swelling, N/V, cough or congestion. Daughter denies utilizing any medications for symptoms PTA. He had a similar episode in 2023 which improved with a Flexeril course. No recent traumas or fall.  Lives in home with daughter. Cardiologist - Dr. Jacinto Halim  The history is provided by the patient and a relative. A language interpreter was used (daughter translates).       Home Medications Prior to Admission medications   Medication Sig Start Date End Date Taking? Authorizing Provider  methocarbamol (ROBAXIN) 500 MG tablet Take 1 tablet (500 mg total) by mouth every 12 (twelve) hours as needed for muscle spasms. 03/18/23  Yes Antony Madura, PA-C  albuterol (VENTOLIN HFA) 108 (90 Base) MCG/ACT inhaler Inhale 1 puff into the lungs as needed. 04/15/21   [provider]  ascorbic acid (VITAMIN C) 500 MG tablet Take 1 tablet (500 mg total) by mouth 2 (two) times daily. 12/28/22     aspirin 81 MG chewable tablet Chew 1 tablet (81 mg total) by mouth daily. 05/12/17   Patwardhan, Anabel Bene, MD  aspirin 81 MG chewable tablet Chew 1 tablet (81 mg total) by mouth in the morning. 12/28/22     colchicine 0.6 MG tablet Take 0.6 mg by mouth continuous  as needed. Take 0.6 mg by mouth every eight hours until acute gout flare subsides 02/23/17   [provider]  empagliflozin (JARDIANCE) 25 MG TABS tablet Take 1 tablet (25 mg total) by mouth daily before breakfast. 01/06/23   Arty Baumgartner, NP  furosemide (LASIX) 20 MG tablet Take 1 tablet (20 mg total) by mouth daily as needed for edema (Leg swelling). 06/14/22 11/21/22  Yates Decamp, MD  glimepiride (AMARYL) 1 MG tablet Take 1 tablet by mouth daily. On hold 02/05/18   [provider]  glimepiride (AMARYL) 1 MG tablet Take 1 tablet (1 mg total) by mouth daily with breakfast or the first main meal of the day. 10/26/22     hydrALAZINE (APRESOLINE) 25 MG tablet Take 1 tablet (25 mg total) by mouth 3 (three) times daily as needed (SBPP > 145 mm Hg). Patient taking differently: Take 25 mg by mouth 3 (three) times daily as needed (SBP > 145 mm Hg). 09/27/21 11/21/22  Yates Decamp, MD  labetalol (NORMODYNE) 200 MG tablet TAKE ONE TABLET BY MOUTH AT BREAKFAST AND AT BEDTIME Patient taking differently: Take 100 mg by mouth 2 (two) times daily. 10/07/19   Yates Decamp, MD  labetalol (NORMODYNE) 100 MG tablet Take 1 tablet (100 mg total) by mouth 2 (two) times daily for blood pressure. 12/28/22     labetalol (NORMODYNE) 100 MG tablet Take 1 tablet (100 mg total) by mouth  2 (two) times daily for blood pressure. 01/25/23     linaclotide (LINZESS) 72 MCG capsule Take 1 capsule (72 mcg total) by mouth daily 30 minutes before the first meal of the day on an empty stomach. 03/12/23     LINZESS 72 MCG capsule Take 72 mcg by mouth every morning. 04/26/21   [provider]  nitroGLYCERIN (NITROSTAT) 0.4 MG SL tablet Dissolve 1 tab under tongue as needed for chest pain. May repeat every 5 minutes x 2 doses. If no relief call 9-1-1. 06/13/22   Yates Decamp, MD  ondansetron (ZOFRAN) 8 MG tablet Take 8 mg by mouth every 8 (eight) hours as needed for nausea or vomiting.     [provider]  pantoprazole  (PROTONIX) 40 MG tablet Take 40 mg by mouth daily.    [provider]  pantoprazole (PROTONIX) 40 MG tablet Take 1 tablet (40 mg total) by mouth daily for acid reflux. 10/26/22     polyethylene glycol (MIRALAX / GLYCOLAX) 17 g packet 1 packet mixed with 8 ounces of fluid 02/01/18   [provider]  Probiotic Product (ALIGN) 4 MG CAPS Take 1 capsule (4 mg total) by mouth daily. 12/28/22     rosuvastatin (CRESTOR) 20 MG tablet Take 20 mg by mouth daily.     [provider]  rosuvastatin (CRESTOR) 40 MG tablet Take 1 tablet (40 mg total) by mouth daily for cholesterol 10/26/22     sacubitril-valsartan (ENTRESTO) 49-51 MG Take 1 tablet by mouth 2 (two) times daily. 01/13/23   Azalee Course, PA  sacubitril-valsartan (ENTRESTO) 49-51 MG Take 1 tablet by mouth 2 (two) times daily. 02/05/23   Yates Decamp, MD  vitamin C (VITAMIN C) 500 MG tablet Take 1 tablet (500 mg total) by mouth 2 (two) times daily. 07/18/18   Erick Blinks, MD      Allergies    Ace inhibitors, Lipitor [atorvastatin calcium], Simvastatin, and Advicor [niacin-lovastatin er]    Review of Systems   Review of Systems Ten systems reviewed and are negative for acute change, except as noted in the HPI.    Physical Exam Updated Vital Signs BP (!) 164/75 (BP Location: Right Arm)   Pulse 74   Temp 97.8 F (36.6 C)   Resp 17   Ht 5\' 4"  (1.626 m)   Wt 74.8 kg   SpO2 98%   BMI 28.31 kg/m   Physical Exam Vitals and nursing note reviewed.  Constitutional:      General: He is not in acute distress.    Appearance: He is well-developed. He is not diaphoretic.     Comments: Nontoxic appearing and in NAD  HENT:     Head: Normocephalic and atraumatic.  Eyes:     General: No scleral icterus.    Conjunctiva/sclera: Conjunctivae normal.  Neck:     Comments: No JVD Cardiovascular:     Rate and Rhythm: Normal rate and regular rhythm.     Pulses: Normal pulses.  Pulmonary:     Effort: Pulmonary effort is normal.  No respiratory distress.     Comments: Respirations even and unlabored Musculoskeletal:        General: Normal range of motion.     Cervical back: Normal range of motion.     Comments: No BLE edema.  Skin:    General: Skin is warm and dry.     Coloration: Skin is not pale.     Findings: No erythema or rash.  Neurological:  Mental Status: He is alert and oriented to person, place, and time.     Coordination: Coordination normal.  Psychiatric:        Behavior: Behavior normal.     ED Results / Procedures / Treatments   Labs (all labs ordered are listed, but only abnormal results are displayed) Labs Reviewed  COMPREHENSIVE METABOLIC PANEL - Abnormal; Notable for the following components:      Result Value   Glucose, Bld 100 (*)    Creatinine, Ser 1.46 (*)    GFR, Estimated 47 (*)    All other components within normal limits  CBC  LIPASE, BLOOD  TROPONIN I (HIGH SENSITIVITY)  TROPONIN I (HIGH SENSITIVITY)    EKG EKG Interpretation Date/Time:  Sunday March 18 2023 02:58:52 EST Ventricular Rate:  87 PR Interval:  158 QRS Duration:  90 QT Interval:  393 QTC Calculation: 366 R Axis:   98  Text Interpretation: Sinus rhythm Paired ventricular premature complexes Right axis deviation Low voltage, precordial leads Baseline wander in lead(s) V2 pvc's new no stemi Confirmed by Tanda Rockers ([id number]) on 03/18/2023 3:51:00 AM  Radiology DG Chest 2 View Result Date: 03/17/2023 CLINICAL DATA:  Chest pain EXAM: CHEST - 2 VIEW COMPARISON:  None Available. FINDINGS: Lungs are well expanded, symmetric, and clear. No pneumothorax or pleural effusion. Cardiac size within normal limits. Pulmonary vascularity is normal. Osseous structures are age-appropriate. No acute bone abnormality. IMPRESSION: No active cardiopulmonary disease. Electronically Signed   By: Helyn Numbers M.D.   On: 03/17/2023 21:41    Procedures Procedures    Medications Ordered in ED Medications  aspirin chewable  tablet 324 mg (324 mg Oral Given 03/17/23 2035)  nitroGLYCERIN (NITROSTAT) SL tablet 0.4 mg (0.4 mg Sublingual Given 03/18/23 0318)  methocarbamol (ROBAXIN) tablet 500 mg (500 mg Oral Given 03/18/23 0346)    ED Course/ Medical Decision Making/ A&P Clinical Course as of 03/18/23 0350  Sun Mar 18, 2023  0339 No symptomatic change with NTG. Given hx of similar symptoms 1 year ago, improved with Flexeril, will trial home course of PRN Robaxin pending close outpatient cardiology f/u. Daughter and patient agreeable to plan. [KH]    Clinical Course User Index [KH] Antony Madura, PA-C                                 Medical Decision Making Risk Prescription drug management.   This patient presents to the ED for concern of chest pain, this involves an extensive number of treatment options, and is a complaint that carries with it a high risk of complications and morbidity.  The differential diagnosis includes ACS vs MSK vs PTX vs pleural effusion vs esophagitis   Co morbidities that complicate the patient evaluation  CAD HTN HLD DM CHF   Additional history obtained:  Additional history obtained from daughter, at bedside External records from outside source obtained and reviewed including abnormal nuclear stress test in June 2023; however, stable from 2022.   Lab Tests:  I Ordered, and personally interpreted labs.  The pertinent results include:  Creatinine 1.46 (chronically elevated, stable). Troponin negative x2   Imaging Studies ordered:  I ordered imaging studies including CXR  I independently visualized and interpreted imaging which showed no acute cardiopulmonary abnormality I agree with the radiologist interpretation   Cardiac Monitoring:  The patient was maintained on a cardiac monitor.  I personally viewed and interpreted the cardiac monitored which showed  an underlying rhythm of: NSR   Medicines ordered and prescription drug management:  I ordered medication including  NTG for chest pain  Reevaluation of the patient after these medicines showed that the patient stayed the same I have reviewed the patients home medicines and have made adjustments as needed   Test Considered:  BNP   Problem List / ED Course:  Patient presents to the emergency department for evaluation of chest pain, intermittent and onset 2 days ago.   Low suspicion for emergent cardiac etiology given reassuring workup today.  EKG is nonischemic and troponin negative x2.  Patient has a heart score of 5 consistent with moderate risk of acute coronary event; however, stated history is atypical for ACS presentation. There was no symptomatic change with 1 SL NTG given in the ED. Patient did have similar exacerbation in 2023, per daughter, which was improved with Flexeril. MSK etiology is certainly likely given reported aggravating/alleviating features.   Chest x-ray without evidence of mediastinal widening to suggest dissection.  No pneumothorax, pneumonia, pleural effusion.  Clinically does not appear fluid overloaded and no overt edema on CXR.  Doubt CHF exacerbation.  Reevaluation:  After the interventions noted above, I reevaluated the patient and found that they have :stayed the same   Social Determinants of Health:  Language barrier Good social support   Dispostion:  After consideration of the diagnostic results and the patients response to treatment, I feel that the patent would benefit from close outpatient f/u with Cardiology. In the interim, given trial of Robaxin for pain. Return precautions discussed and provided. Patient discharged in stable condition with no unaddressed concerns.          Final Clinical Impression(s) / ED Diagnoses Final diagnoses:  Atypical chest pain  Bigeminy    Rx / DC Orders ED Discharge Orders          Ordered    methocarbamol (ROBAXIN) 500 MG tablet  Every 12 hours PRN        03/18/23 0346              Antony Madura,  PA-C 03/18/23 0433    Sloan Leiter, DO 03/18/23 [phone]

## 2023-03-18 NOTE — Discharge Instructions (Addendum)
Your evaluation in the ED today was reassuring. We do recommend follow up with your Cardiologist within the week to further discuss your symptoms. In the interim, use Robaxin as prescribed for pain. Do not drive or drink alcohol after taking this medication as it may make you drowsy and impair your judgement. Return to the ED for new or concerning symptoms.

## 2023-03-18 NOTE — ED Notes (Signed)
 ED Provider at bedside.

## 2023-03-21 ENCOUNTER — Other Ambulatory Visit (HOSPITAL_BASED_OUTPATIENT_CLINIC_OR_DEPARTMENT_OTHER): Payer: Self-pay

## 2023-03-21 MED ORDER — ROSUVASTATIN CALCIUM 40 MG PO TABS
40.0000 mg | ORAL_TABLET | Freq: Every day | ORAL | 0 refills | Status: DC
  Filled 2023-03-21: qty 90, 90d supply, fill #0
  Filled 2023-03-28 – 2023-03-29 (×2): qty 30, 30d supply, fill #0
  Filled 2023-04-16 – 2023-04-25 (×2): qty 30, 30d supply, fill #1
  Filled 2023-05-04 – 2023-05-22 (×4): qty 30, 30d supply, fill #2
  Filled ????-??-??: fill #0
  Filled ????-??-??: fill #2

## 2023-03-23 ENCOUNTER — Other Ambulatory Visit: Payer: Self-pay

## 2023-03-23 ENCOUNTER — Other Ambulatory Visit (HOSPITAL_COMMUNITY): Payer: Self-pay

## 2023-03-23 MED ORDER — GLIMEPIRIDE 1 MG PO TABS
1.0000 mg | ORAL_TABLET | Freq: Every day | ORAL | 0 refills | Status: DC
  Filled 2023-03-23: qty 90, 90d supply, fill #0
  Filled 2023-04-02: qty 30, 30d supply, fill #0
  Filled 2023-04-16 – 2023-04-25 (×2): qty 30, 30d supply, fill #1
  Filled 2023-05-04 – 2023-05-22 (×4): qty 30, 30d supply, fill #2
  Filled ????-??-??: fill #2

## 2023-03-23 MED ORDER — PANTOPRAZOLE SODIUM 40 MG PO TBEC
40.0000 mg | DELAYED_RELEASE_TABLET | Freq: Every day | ORAL | 0 refills | Status: DC
  Filled 2023-03-23 – 2023-05-24 (×2): qty 90, 90d supply, fill #0

## 2023-03-23 MED ORDER — ASPIRIN 81 MG PO CHEW
81.0000 mg | CHEWABLE_TABLET | Freq: Every morning | ORAL | 0 refills | Status: DC
  Filled 2023-03-23 – 2023-03-29 (×3): qty 30, 30d supply, fill #0
  Filled 2023-04-16 – 2023-04-25 (×2): qty 30, 30d supply, fill #1
  Filled 2023-05-04 – 2023-05-22 (×4): qty 30, 30d supply, fill #2
  Filled ????-??-??: fill #2

## 2023-03-28 ENCOUNTER — Other Ambulatory Visit: Payer: Self-pay

## 2023-03-28 ENCOUNTER — Other Ambulatory Visit (HOSPITAL_COMMUNITY): Payer: Self-pay

## 2023-03-29 ENCOUNTER — Other Ambulatory Visit: Payer: Self-pay

## 2023-03-29 ENCOUNTER — Other Ambulatory Visit (HOSPITAL_COMMUNITY): Payer: Self-pay

## 2023-03-30 ENCOUNTER — Other Ambulatory Visit: Payer: Self-pay

## 2023-03-31 ENCOUNTER — Other Ambulatory Visit (HOSPITAL_COMMUNITY): Payer: Self-pay

## 2023-04-02 ENCOUNTER — Other Ambulatory Visit: Payer: Self-pay

## 2023-04-02 ENCOUNTER — Other Ambulatory Visit (HOSPITAL_COMMUNITY): Payer: Self-pay

## 2023-04-03 ENCOUNTER — Other Ambulatory Visit (HOSPITAL_COMMUNITY): Payer: Self-pay

## 2023-04-12 ENCOUNTER — Other Ambulatory Visit (HOSPITAL_COMMUNITY): Payer: Self-pay

## 2023-04-13 ENCOUNTER — Other Ambulatory Visit: Payer: Self-pay

## 2023-04-16 ENCOUNTER — Other Ambulatory Visit: Payer: Self-pay

## 2023-04-16 ENCOUNTER — Other Ambulatory Visit: Payer: Self-pay | Admitting: Cardiology

## 2023-04-16 DIAGNOSIS — I5032 Chronic diastolic (congestive) heart failure: Secondary | ICD-10-CM

## 2023-04-17 ENCOUNTER — Other Ambulatory Visit: Payer: Self-pay

## 2023-04-17 ENCOUNTER — Other Ambulatory Visit (HOSPITAL_COMMUNITY): Payer: Self-pay

## 2023-04-17 MED ORDER — LABETALOL HCL 200 MG PO TABS
100.0000 mg | ORAL_TABLET | Freq: Two times a day (BID) | ORAL | 0 refills | Status: DC
  Filled 2023-04-17: qty 100, 100d supply, fill #0
  Filled 2023-04-25 – 2023-04-26 (×2): qty 30, 30d supply, fill #0
  Filled 2023-05-04 – 2023-05-22 (×5): qty 30, 30d supply, fill #1
  Filled 2023-06-21 – 2023-06-22 (×3): qty 30, 30d supply, fill #2
  Filled ????-??-??: fill #1

## 2023-04-18 ENCOUNTER — Other Ambulatory Visit: Payer: Self-pay

## 2023-04-25 ENCOUNTER — Other Ambulatory Visit: Payer: Self-pay

## 2023-04-25 ENCOUNTER — Other Ambulatory Visit (HOSPITAL_COMMUNITY): Payer: Self-pay

## 2023-04-26 ENCOUNTER — Other Ambulatory Visit: Payer: Self-pay

## 2023-04-27 ENCOUNTER — Other Ambulatory Visit: Payer: Self-pay

## 2023-05-04 ENCOUNTER — Other Ambulatory Visit (HOSPITAL_COMMUNITY): Payer: Self-pay

## 2023-05-04 ENCOUNTER — Other Ambulatory Visit: Payer: Self-pay | Admitting: Cardiology

## 2023-05-04 ENCOUNTER — Other Ambulatory Visit: Payer: Self-pay

## 2023-05-04 DIAGNOSIS — I5032 Chronic diastolic (congestive) heart failure: Secondary | ICD-10-CM

## 2023-05-04 MED ORDER — EMPAGLIFLOZIN 25 MG PO TABS
25.0000 mg | ORAL_TABLET | Freq: Every day | ORAL | 0 refills | Status: DC
  Filled 2023-05-17: qty 90, 90d supply, fill #0
  Filled 2023-05-22 (×2): qty 30, 30d supply, fill #0
  Filled 2023-06-05 – 2023-06-22 (×5): qty 30, 30d supply, fill #1
  Filled 2023-07-11 – 2023-07-19 (×3): qty 30, 30d supply, fill #2
  Filled ????-??-??: fill #0

## 2023-05-04 NOTE — Telephone Encounter (Signed)
 Pt's pharmacy is requesting a refill on medication empagliflozin (Jardiance) 25 mg. Our office don't refill jardiance 25 mg, we only refill jardiance 10 mg, but Dr. Jacinto Halim has been refilling jardiance 25 mg tablets. Would Dr. Jacinto Halim like to continue to refill this medication or does pt need to get it from his PCP? Please address

## 2023-05-04 NOTE — Telephone Encounter (Signed)
 OK to refill

## 2023-05-15 ENCOUNTER — Other Ambulatory Visit (HOSPITAL_COMMUNITY): Payer: Self-pay

## 2023-05-17 ENCOUNTER — Other Ambulatory Visit: Payer: Self-pay

## 2023-05-17 ENCOUNTER — Other Ambulatory Visit (HOSPITAL_COMMUNITY): Payer: Self-pay

## 2023-05-18 ENCOUNTER — Other Ambulatory Visit: Payer: Self-pay

## 2023-05-22 ENCOUNTER — Other Ambulatory Visit (HOSPITAL_COMMUNITY): Payer: Self-pay

## 2023-05-22 ENCOUNTER — Other Ambulatory Visit: Payer: Self-pay

## 2023-05-23 ENCOUNTER — Encounter (HOSPITAL_COMMUNITY): Payer: Self-pay

## 2023-05-24 ENCOUNTER — Other Ambulatory Visit (HOSPITAL_COMMUNITY): Payer: Self-pay

## 2023-05-24 ENCOUNTER — Other Ambulatory Visit: Payer: Self-pay

## 2023-05-25 ENCOUNTER — Other Ambulatory Visit (HOSPITAL_COMMUNITY): Payer: Self-pay

## 2023-05-25 ENCOUNTER — Other Ambulatory Visit: Payer: Self-pay

## 2023-05-25 DIAGNOSIS — I700 Atherosclerosis of aorta: Secondary | ICD-10-CM | POA: Diagnosis not present

## 2023-05-25 DIAGNOSIS — M109 Gout, unspecified: Secondary | ICD-10-CM | POA: Diagnosis not present

## 2023-05-25 DIAGNOSIS — I509 Heart failure, unspecified: Secondary | ICD-10-CM | POA: Diagnosis not present

## 2023-05-25 DIAGNOSIS — I10 Essential (primary) hypertension: Secondary | ICD-10-CM | POA: Diagnosis not present

## 2023-05-25 DIAGNOSIS — E782 Mixed hyperlipidemia: Secondary | ICD-10-CM | POA: Diagnosis not present

## 2023-05-25 DIAGNOSIS — K219 Gastro-esophageal reflux disease without esophagitis: Secondary | ICD-10-CM | POA: Diagnosis not present

## 2023-05-25 DIAGNOSIS — E118 Type 2 diabetes mellitus with unspecified complications: Secondary | ICD-10-CM | POA: Diagnosis not present

## 2023-05-25 DIAGNOSIS — N183 Chronic kidney disease, stage 3 unspecified: Secondary | ICD-10-CM | POA: Diagnosis not present

## 2023-05-25 DIAGNOSIS — I2510 Atherosclerotic heart disease of native coronary artery without angina pectoris: Secondary | ICD-10-CM | POA: Diagnosis not present

## 2023-05-25 MED ORDER — LABETALOL HCL 100 MG PO TABS
100.0000 mg | ORAL_TABLET | Freq: Two times a day (BID) | ORAL | 3 refills | Status: AC
  Filled 2023-05-25: qty 180, 90d supply, fill #0
  Filled 2023-05-29: qty 60, 30d supply, fill #0
  Filled 2023-06-05 – 2023-06-22 (×5): qty 60, 30d supply, fill #1
  Filled 2023-07-17 – 2023-07-19 (×2): qty 60, 30d supply, fill #2
  Filled 2023-08-10 – 2023-08-14 (×2): qty 60, 30d supply, fill #3
  Filled 2023-09-13: qty 60, 30d supply, fill #4
  Filled 2023-10-11: qty 60, 30d supply, fill #5
  Filled 2023-11-13: qty 60, 30d supply, fill #6
  Filled 2023-12-11: qty 60, 30d supply, fill #7
  Filled 2024-01-02 – 2024-01-07 (×3): qty 60, 30d supply, fill #8
  Filled 2024-02-18: qty 60, 30d supply, fill #9
  Filled 2024-03-14: qty 60, 30d supply, fill #10

## 2023-05-26 ENCOUNTER — Other Ambulatory Visit (HOSPITAL_COMMUNITY): Payer: Self-pay

## 2023-05-26 ENCOUNTER — Other Ambulatory Visit (HOSPITAL_BASED_OUTPATIENT_CLINIC_OR_DEPARTMENT_OTHER): Payer: Self-pay

## 2023-05-26 MED ORDER — GLUCOSE BLOOD VI STRP
ORAL_STRIP | 11 refills | Status: AC
  Filled 2023-05-26: qty 100, 100d supply, fill #0
  Filled 2023-09-13: qty 100, 100d supply, fill #1

## 2023-05-29 ENCOUNTER — Other Ambulatory Visit: Payer: Self-pay

## 2023-06-05 ENCOUNTER — Other Ambulatory Visit (HOSPITAL_COMMUNITY): Payer: Self-pay

## 2023-06-05 ENCOUNTER — Other Ambulatory Visit: Payer: Self-pay

## 2023-06-05 MED ORDER — ASPIRIN 81 MG PO CHEW
81.0000 mg | CHEWABLE_TABLET | Freq: Every morning | ORAL | 3 refills | Status: AC
  Filled 2023-06-21: qty 90, 90d supply, fill #0
  Filled 2023-06-21 – 2023-06-22 (×2): qty 30, 30d supply, fill #0
  Filled 2023-07-11 – 2023-07-19 (×3): qty 30, 30d supply, fill #1
  Filled 2023-08-10 – 2023-08-14 (×2): qty 30, 30d supply, fill #2
  Filled 2023-09-13: qty 30, 30d supply, fill #3
  Filled 2023-10-11: qty 30, 30d supply, fill #4
  Filled 2023-11-13: qty 30, 30d supply, fill #5
  Filled 2023-12-11: qty 30, 30d supply, fill #6
  Filled 2024-01-02 – 2024-01-07 (×3): qty 30, 30d supply, fill #7
  Filled 2024-02-18: qty 30, 30d supply, fill #8
  Filled 2024-03-14: qty 30, 30d supply, fill #9

## 2023-06-05 MED ORDER — GLIMEPIRIDE 1 MG PO TABS
1.0000 mg | ORAL_TABLET | Freq: Every day | ORAL | 3 refills | Status: AC
Start: 2023-06-05 — End: ?
  Filled 2023-06-21 – 2023-06-22 (×3): qty 30, 30d supply, fill #0
  Filled 2023-07-11 – 2023-07-19 (×3): qty 30, 30d supply, fill #1
  Filled 2023-08-10 – 2023-08-14 (×2): qty 30, 30d supply, fill #2
  Filled 2023-09-13: qty 30, 30d supply, fill #3
  Filled 2023-10-11: qty 30, 30d supply, fill #4
  Filled 2023-11-13: qty 30, 30d supply, fill #5
  Filled 2023-12-11: qty 30, 30d supply, fill #6
  Filled 2024-01-02 – 2024-01-07 (×3): qty 30, 30d supply, fill #7
  Filled 2024-02-18: qty 30, 30d supply, fill #8
  Filled 2024-03-14: qty 30, 30d supply, fill #9

## 2023-06-05 MED ORDER — ROSUVASTATIN CALCIUM 40 MG PO TABS
40.0000 mg | ORAL_TABLET | Freq: Every day | ORAL | 3 refills | Status: AC
  Filled 2023-06-21 – 2023-06-22 (×3): qty 30, 30d supply, fill #0
  Filled 2023-07-11 – 2023-07-19 (×3): qty 30, 30d supply, fill #1
  Filled 2023-08-10 – 2023-08-14 (×2): qty 30, 30d supply, fill #2
  Filled 2023-09-13: qty 30, 30d supply, fill #3
  Filled 2023-10-11: qty 30, 30d supply, fill #4
  Filled 2023-11-13: qty 30, 30d supply, fill #5
  Filled 2023-12-11: qty 30, 30d supply, fill #6
  Filled 2024-01-02 – 2024-01-07 (×3): qty 30, 30d supply, fill #7
  Filled 2024-02-18: qty 30, 30d supply, fill #8
  Filled 2024-03-14: qty 30, 30d supply, fill #9

## 2023-06-06 ENCOUNTER — Other Ambulatory Visit (HOSPITAL_COMMUNITY): Payer: Self-pay

## 2023-06-21 ENCOUNTER — Other Ambulatory Visit: Payer: Self-pay

## 2023-06-22 ENCOUNTER — Other Ambulatory Visit: Payer: Self-pay

## 2023-06-26 ENCOUNTER — Other Ambulatory Visit: Payer: Self-pay

## 2023-07-06 ENCOUNTER — Other Ambulatory Visit: Payer: Self-pay

## 2023-07-11 ENCOUNTER — Other Ambulatory Visit: Payer: Self-pay

## 2023-07-16 ENCOUNTER — Other Ambulatory Visit (HOSPITAL_COMMUNITY): Payer: Self-pay

## 2023-07-17 ENCOUNTER — Other Ambulatory Visit (HOSPITAL_COMMUNITY): Payer: Self-pay

## 2023-07-17 ENCOUNTER — Other Ambulatory Visit: Payer: Self-pay

## 2023-07-18 ENCOUNTER — Other Ambulatory Visit: Payer: Self-pay

## 2023-07-19 ENCOUNTER — Other Ambulatory Visit: Payer: Self-pay

## 2023-07-20 ENCOUNTER — Other Ambulatory Visit: Payer: Self-pay

## 2023-08-10 ENCOUNTER — Other Ambulatory Visit: Payer: Self-pay | Admitting: Cardiology

## 2023-08-10 ENCOUNTER — Other Ambulatory Visit (HOSPITAL_COMMUNITY): Payer: Self-pay

## 2023-08-10 ENCOUNTER — Other Ambulatory Visit: Payer: Self-pay

## 2023-08-10 DIAGNOSIS — I5032 Chronic diastolic (congestive) heart failure: Secondary | ICD-10-CM

## 2023-08-10 MED ORDER — EMPAGLIFLOZIN 25 MG PO TABS
25.0000 mg | ORAL_TABLET | Freq: Every day | ORAL | 0 refills | Status: DC
  Filled 2023-08-10 – 2023-08-14 (×2): qty 30, 30d supply, fill #0
  Filled 2023-09-13: qty 30, 30d supply, fill #1
  Filled 2023-10-11: qty 30, 30d supply, fill #2

## 2023-08-10 MED ORDER — PANTOPRAZOLE SODIUM 40 MG PO TBEC
40.0000 mg | DELAYED_RELEASE_TABLET | Freq: Every day | ORAL | 3 refills | Status: AC
  Filled 2023-08-14: qty 30, 30d supply, fill #0
  Filled 2023-09-13: qty 30, 30d supply, fill #1
  Filled 2023-10-11: qty 30, 30d supply, fill #2
  Filled 2023-11-13: qty 30, 30d supply, fill #3
  Filled 2023-12-11: qty 30, 30d supply, fill #4
  Filled 2024-01-02 – 2024-01-07 (×3): qty 30, 30d supply, fill #5
  Filled 2024-02-18: qty 30, 30d supply, fill #6
  Filled 2024-03-14: qty 30, 30d supply, fill #7

## 2023-08-14 ENCOUNTER — Other Ambulatory Visit: Payer: Self-pay

## 2023-08-15 ENCOUNTER — Other Ambulatory Visit: Payer: Self-pay

## 2023-08-21 ENCOUNTER — Other Ambulatory Visit: Payer: Self-pay

## 2023-08-25 ENCOUNTER — Other Ambulatory Visit (HOSPITAL_COMMUNITY): Payer: Self-pay

## 2023-08-31 ENCOUNTER — Other Ambulatory Visit: Payer: Self-pay

## 2023-09-06 ENCOUNTER — Other Ambulatory Visit: Payer: Self-pay

## 2023-09-13 ENCOUNTER — Other Ambulatory Visit: Payer: Self-pay

## 2023-09-14 ENCOUNTER — Other Ambulatory Visit: Payer: Self-pay

## 2023-10-01 ENCOUNTER — Other Ambulatory Visit: Payer: Self-pay

## 2023-10-11 ENCOUNTER — Other Ambulatory Visit: Payer: Self-pay

## 2023-10-12 ENCOUNTER — Other Ambulatory Visit: Payer: Self-pay

## 2023-11-02 ENCOUNTER — Other Ambulatory Visit: Payer: Self-pay

## 2023-11-02 ENCOUNTER — Other Ambulatory Visit (HOSPITAL_COMMUNITY): Payer: Self-pay

## 2023-11-02 ENCOUNTER — Other Ambulatory Visit: Payer: Self-pay | Admitting: Cardiology

## 2023-11-02 DIAGNOSIS — I5032 Chronic diastolic (congestive) heart failure: Secondary | ICD-10-CM

## 2023-11-02 MED ORDER — EMPAGLIFLOZIN 25 MG PO TABS
25.0000 mg | ORAL_TABLET | Freq: Every day | ORAL | 0 refills | Status: DC
  Filled 2023-11-13: qty 30, 30d supply, fill #0

## 2023-11-13 ENCOUNTER — Other Ambulatory Visit: Payer: Self-pay

## 2023-11-13 ENCOUNTER — Other Ambulatory Visit (HOSPITAL_COMMUNITY): Payer: Self-pay

## 2023-11-14 ENCOUNTER — Other Ambulatory Visit: Payer: Self-pay

## 2023-11-15 ENCOUNTER — Other Ambulatory Visit: Payer: Self-pay

## 2023-11-16 ENCOUNTER — Other Ambulatory Visit: Payer: Self-pay

## 2023-11-23 DIAGNOSIS — I509 Heart failure, unspecified: Secondary | ICD-10-CM | POA: Diagnosis not present

## 2023-11-23 DIAGNOSIS — E118 Type 2 diabetes mellitus with unspecified complications: Secondary | ICD-10-CM | POA: Diagnosis not present

## 2023-11-23 DIAGNOSIS — E782 Mixed hyperlipidemia: Secondary | ICD-10-CM | POA: Diagnosis not present

## 2023-11-23 DIAGNOSIS — I10 Essential (primary) hypertension: Secondary | ICD-10-CM | POA: Diagnosis not present

## 2023-11-23 DIAGNOSIS — I2510 Atherosclerotic heart disease of native coronary artery without angina pectoris: Secondary | ICD-10-CM | POA: Diagnosis not present

## 2023-11-23 DIAGNOSIS — Z0000 Encounter for general adult medical examination without abnormal findings: Secondary | ICD-10-CM | POA: Diagnosis not present

## 2023-11-23 DIAGNOSIS — N1832 Chronic kidney disease, stage 3b: Secondary | ICD-10-CM | POA: Diagnosis not present

## 2023-11-23 DIAGNOSIS — M109 Gout, unspecified: Secondary | ICD-10-CM | POA: Diagnosis not present

## 2023-11-23 DIAGNOSIS — K219 Gastro-esophageal reflux disease without esophagitis: Secondary | ICD-10-CM | POA: Diagnosis not present

## 2023-12-05 ENCOUNTER — Other Ambulatory Visit: Payer: Self-pay | Admitting: Cardiology

## 2023-12-05 ENCOUNTER — Other Ambulatory Visit: Payer: Self-pay | Admitting: Physician Assistant

## 2023-12-05 ENCOUNTER — Other Ambulatory Visit: Payer: Self-pay

## 2023-12-05 DIAGNOSIS — I5032 Chronic diastolic (congestive) heart failure: Secondary | ICD-10-CM

## 2023-12-06 ENCOUNTER — Other Ambulatory Visit (HOSPITAL_COMMUNITY): Payer: Self-pay

## 2023-12-07 ENCOUNTER — Other Ambulatory Visit (HOSPITAL_COMMUNITY): Payer: Self-pay

## 2023-12-07 MED ORDER — SACUBITRIL-VALSARTAN 49-51 MG PO TABS
1.0000 | ORAL_TABLET | Freq: Two times a day (BID) | ORAL | 0 refills | Status: DC
  Filled 2023-12-07 – 2023-12-11 (×2): qty 60, 30d supply, fill #0
  Filled 2024-01-02 – 2024-01-07 (×3): qty 60, 30d supply, fill #1
  Filled 2024-02-20: qty 60, 30d supply, fill #2

## 2023-12-07 MED ORDER — EMPAGLIFLOZIN 25 MG PO TABS
25.0000 mg | ORAL_TABLET | Freq: Every day | ORAL | 0 refills | Status: DC
  Filled 2023-12-07 – 2023-12-11 (×2): qty 30, 30d supply, fill #0
  Filled 2024-01-02 – 2024-01-07 (×3): qty 30, 30d supply, fill #1
  Filled 2024-02-18: qty 30, 30d supply, fill #2

## 2023-12-10 ENCOUNTER — Other Ambulatory Visit: Payer: Self-pay

## 2023-12-11 ENCOUNTER — Other Ambulatory Visit: Payer: Self-pay

## 2023-12-12 ENCOUNTER — Other Ambulatory Visit: Payer: Self-pay

## 2024-01-02 ENCOUNTER — Other Ambulatory Visit: Payer: Self-pay

## 2024-01-07 ENCOUNTER — Other Ambulatory Visit: Payer: Self-pay

## 2024-01-08 ENCOUNTER — Other Ambulatory Visit: Payer: Self-pay

## 2024-01-09 ENCOUNTER — Other Ambulatory Visit: Payer: Self-pay

## 2024-02-18 ENCOUNTER — Other Ambulatory Visit: Payer: Self-pay

## 2024-02-18 ENCOUNTER — Encounter: Payer: Self-pay | Admitting: Cardiology

## 2024-02-18 ENCOUNTER — Encounter: Payer: Self-pay | Admitting: Pharmacist

## 2024-02-19 ENCOUNTER — Other Ambulatory Visit: Payer: Self-pay

## 2024-02-19 ENCOUNTER — Other Ambulatory Visit (HOSPITAL_COMMUNITY): Payer: Self-pay

## 2024-02-19 ENCOUNTER — Telehealth: Payer: Self-pay | Admitting: Pharmacy Technician

## 2024-02-19 NOTE — Telephone Encounter (Signed)
" ° °  Patient Advocate Encounter   The patient was approved for a [employer] grant that will help cover the cost of entresto /jardiance  Total amount awarded, 7500.  Effective: 02/13/24 - 02/11/25   APW:[id number] ERW:[id number] Hmnle:[id number] PI:[id number] [employer] ID: [id number]   Pharmacy provided with approval and processing information. Patient informed via mychart   I put in wam "

## 2024-02-20 ENCOUNTER — Other Ambulatory Visit: Payer: Self-pay

## 2024-02-20 ENCOUNTER — Other Ambulatory Visit (HOSPITAL_COMMUNITY): Payer: Self-pay

## 2024-02-29 ENCOUNTER — Ambulatory Visit: Attending: Cardiology | Admitting: Cardiology

## 2024-02-29 ENCOUNTER — Encounter: Payer: Self-pay | Admitting: Cardiology

## 2024-02-29 ENCOUNTER — Other Ambulatory Visit: Payer: Self-pay

## 2024-02-29 ENCOUNTER — Other Ambulatory Visit (HOSPITAL_COMMUNITY): Payer: Self-pay

## 2024-02-29 VITALS — BP 138/64 | HR 63 | Resp 16 | Ht 64.0 in | Wt 169.8 lb

## 2024-02-29 DIAGNOSIS — E782 Mixed hyperlipidemia: Secondary | ICD-10-CM

## 2024-02-29 DIAGNOSIS — I10 Essential (primary) hypertension: Secondary | ICD-10-CM | POA: Diagnosis not present

## 2024-02-29 DIAGNOSIS — N1832 Chronic kidney disease, stage 3b: Secondary | ICD-10-CM | POA: Diagnosis not present

## 2024-02-29 DIAGNOSIS — I5032 Chronic diastolic (congestive) heart failure: Secondary | ICD-10-CM

## 2024-02-29 DIAGNOSIS — I25118 Atherosclerotic heart disease of native coronary artery with other forms of angina pectoris: Secondary | ICD-10-CM

## 2024-02-29 MED ORDER — EZETIMIBE 10 MG PO TABS
10.0000 mg | ORAL_TABLET | Freq: Every day | ORAL | 3 refills | Status: AC
  Filled 2024-02-29: qty 90, 90d supply, fill #0
  Filled 2024-03-14: qty 30, 30d supply, fill #0

## 2024-02-29 NOTE — Progress Notes (Signed)
 " Cardiology Office Note:  .   Date:  03/02/2024  ID:  [place], DOB 1940-05-30, MRN [id number] PCP: Auston Opal, DO  [facility] Providers Cardiologist:  Gordy Bergamo, MD   History of Present Illness: .   Milus Amico is a 84 y.o. [place] male (does not speak English, his daughters transcribe) with coronary artery disease status post PTCA to diagonal 2 for acute MI in 2005, hypertension, hyperlipidemia, diabetes mellitus with stage IIIa chronic kidney disease, chronic diastolic heart failure presents here for 43-month office visit. Daughter is present. He is asymptomatic.    Discussed the use of AI scribe software for clinical note transcription with the patient, who gave verbal consent to proceed.  History of Present Illness Ahmon Tosi is an 84 year old male with chronic kidney disease and hyperlipidemia who presents for a cardiovascular follow-up.  He notes worsening leg swelling during a prior trip that he associated with wearing compression stockings at high elevation. The swelling improved on a later trip when he did not use the stockings.  Recent labs from September 2025 showed a GFR of 37 mL/min with normal potassium. Blood sugar and cholesterol were slightly higher than previous values.  He takes Crestor  40 mg daily, Jardiance , and Entresto  for cardiovascular risk and heart failure management, with financial assistance supporting access to these medications.  Cardiac Studies relevent.   MYOCARDIAL PERFUSION WO LEXISCAN  03/24/2020 Normal ECG stress. The patient exercised for 5 minutes and 18 seconds of a Bruce protocol, achieving approximately 7.05 METs.  Stress terminated due to dyspnea. Resting EKG normal sinus rhythm, stress EKG no ST-T wave changes with stress testing.  Frequent PVCs were evident through the exercise and in recovery. Myocardial perfusion is abnormal. There is a moderate-sized scar noted in the anterior and anterolateral and apical lateral region with  moderate peri-infarct ischemia. Gated SPECT imaging of the left ventricle was abnormal, demonstrating akinesis of the apical anterior wall, apical lateral wall and mid anterior wall.  Stress LV EF is moderately dysfunctional 37%. Compared to 10/18/2012, moderate peri-infarct ischemia is new.  Previously scar was noted in similar region.  Intermediate risk.   PCV ECHOCARDIOGRAM COMPLETE 06/29/2021  Left ventricle cavity is normal in size. Moderate concentric hypertrophy of the left ventricle. Normal global wall motion. Normal LV systolic function with EF 71%. Doppler evidence of grade I (impaired) diastolic dysfunction, normal LAP. Trileaflet aortic valve. Mild aortic valve leaflet calcification. Trace aortic regurgitation. Moderate tricuspid regurgitation. Mild pulmonic regurgitation. No evidence of pulmonary hypertension. Compared to previous study on 02/12/2020, mod TR is new.  EKG:   EKG Interpretation Date/Time:  Friday February 29 2024 16:02:50 EST Ventricular Rate:  63 PR Interval:  162 QRS Duration:  92 QT Interval:  370 QTC Calculation: 378 R Axis:   29  Text Interpretation: EKG 02/29/2024: Normal sinus rhythm with sinus arrhythmia at rate of 63 bpm, nonspecific T wave flattening lateral leads.  Compared to 03/18/2023, frequent PVCs in bigeminal pattern not present. Confirmed by Farzana Koci, Jagadeesh [phone]) on 02/29/2024 4:34:53 PM  Labs   Care everywhere/Faxed External Labs:  Labs 11/29/2023:  A1c 7.7%.  Serum glucose 225, BUN 22, creatinine 1. 77, eGFR 37 mL, potassium 4.3, LFTs normal.  Total cholesterol 165, triglycerides 115, HDL 53, LDL 91.  ROS  Review of Systems  Cardiovascular:  Negative for chest pain, dyspnea on exertion and leg swelling.   Physical Exam:   VS:  BP 138/64 (BP Location: Left Arm, Patient Position: Sitting, Cuff Size: Normal)  Pulse 63   Resp 16   Ht 5' 4 (1.626 m)   Wt 169 lb 12.8 oz (77 kg)   SpO2 97%   BMI 29.15 kg/m    Wt Readings from  Last 3 Encounters:  02/29/24 169 lb 12.8 oz (77 kg)  03/17/23 164 lb 14.5 oz (74.8 kg)  11/21/22 164 lb 12.8 oz (74.8 kg)    BP Readings from Last 3 Encounters:  02/29/24 138/64  03/18/23 (!) 147/63  11/21/22 106/68   Physical Exam Neck:     Vascular: No carotid bruit or JVD.  Cardiovascular:     Rate and Rhythm: Normal rate and regular rhythm.     Pulses: Intact distal pulses.     Heart sounds: Normal heart sounds. No murmur heard.    No gallop.  Pulmonary:     Effort: Pulmonary effort is normal.     Breath sounds: Normal breath sounds.  Abdominal:     General: Bowel sounds are normal.     Palpations: Abdomen is soft.  Musculoskeletal:     Right lower leg: No edema.     Left lower leg: No edema.     ASSESSMENT AND PLAN: .      ICD-10-CM   1. Chronic diastolic heart failure (HCC)  P49.67 EKG 12-Lead    2. Coronary artery disease of native artery of native heart with stable angina pectoris  I25.118     3. Essential hypertension  I10     4. Stage 3b chronic kidney disease (HCC)  N18.32     5. Mixed hyperlipidemia  E78.2 ezetimibe  (ZETIA ) 10 MG tablet     Assessment & Plan Chronic diastolic heart failure EKG shows no extra heartbeats, indicating improvement from previous visits. - Continue current medications including Entresto .  Coronary artery disease with stable angina EKG is normal, indicating stable cardiac function. - Continue current medications including Jardiance .  Stage 3b chronic kidney disease Well-managed with a GFR of 37 mL/min. Potassium levels are within normal range.  Mixed hyperlipidemia Slightly elevated cholesterol levels. Currently on atorvastatin 40 mg. Plan to add ezetimibe  to further reduce cholesterol levels and provide better cardiovascular protection. - Prescribed ezetimibe  to be taken with atorvastatin.  Follow up: 1 Year or sooner if problems for CAD and Hyperlipidemia  Signed,  Gordy Bergamo, MD, [facility] 03/02/2024, 7:15 PM [facility] [address] [place], [place] [address] Phone: [phone]. Fax:  [phone]  "

## 2024-02-29 NOTE — Patient Instructions (Addendum)
 Medication Instructions:  START  ezetimibe  (ZETIA ) 10 MG tablet         Take 1 tablet (10 mg total) by mouth daily   *If you need a refill on your cardiac medications before your next appointment, please call your pharmacy*   Follow-Up: At [facility], you and your health needs are our priority.  As part of our continuing mission to provide you with exceptional heart care, our providers are all part of one team.  This team includes your primary Cardiologist (physician) and Advanced Practice Providers or APPs (Physician Assistants and Nurse Practitioners) who all work together to provide you with the care you need, when you need it.  Your next appointment:   1 year(s)  Provider:   Gordy Bergamo, MD    We recommend signing up for the patient portal called MyChart.  Sign up information is provided on this After Visit Summary.  MyChart is used to connect with patients for Virtual Visits (Telemedicine).  Patients are able to view lab/test results, encounter notes, upcoming appointments, etc.  Non-urgent messages can be sent to your provider as well.            We recommend signing up for the patient portal called MyChart.  Patients are able to view lab/test results, encounter notes, upcoming appointments, etc.  Non-urgent messages can be sent to your provider as well, go to [email or web address].

## 2024-03-03 ENCOUNTER — Other Ambulatory Visit: Payer: Self-pay

## 2024-03-03 ENCOUNTER — Other Ambulatory Visit (HOSPITAL_COMMUNITY): Payer: Self-pay

## 2024-03-03 ENCOUNTER — Other Ambulatory Visit: Payer: Self-pay | Admitting: Cardiology

## 2024-03-03 DIAGNOSIS — I5032 Chronic diastolic (congestive) heart failure: Secondary | ICD-10-CM

## 2024-03-03 MED ORDER — EMPAGLIFLOZIN 25 MG PO TABS
25.0000 mg | ORAL_TABLET | Freq: Every day | ORAL | 0 refills | Status: AC
  Filled 2024-03-14: qty 30, 30d supply, fill #0

## 2024-03-06 ENCOUNTER — Other Ambulatory Visit (HOSPITAL_COMMUNITY): Payer: Self-pay

## 2024-03-07 ENCOUNTER — Other Ambulatory Visit (HOSPITAL_COMMUNITY): Payer: Self-pay

## 2024-03-14 ENCOUNTER — Other Ambulatory Visit: Payer: Self-pay

## 2024-03-18 ENCOUNTER — Other Ambulatory Visit: Payer: Self-pay
